# Patient Record
Sex: Male | Born: 1960 | Race: White | Hispanic: No | Marital: Single | Smoking: Current every day smoker
Health system: Southern US, Community
[De-identification: ages and names within clinical notes are randomized; demographics above are authoritative.]

## PROBLEM LIST (undated history)

## (undated) DIAGNOSIS — F1010 Alcohol abuse, uncomplicated: Secondary | ICD-10-CM

## (undated) DIAGNOSIS — F329 Major depressive disorder, single episode, unspecified: Secondary | ICD-10-CM

## (undated) DIAGNOSIS — F1120 Opioid dependence, uncomplicated: Secondary | ICD-10-CM

## (undated) DIAGNOSIS — Z8619 Personal history of other infectious and parasitic diseases: Secondary | ICD-10-CM

## (undated) DIAGNOSIS — K219 Gastro-esophageal reflux disease without esophagitis: Secondary | ICD-10-CM

## (undated) DIAGNOSIS — F319 Bipolar disorder, unspecified: Secondary | ICD-10-CM

## (undated) DIAGNOSIS — J189 Pneumonia, unspecified organism: Secondary | ICD-10-CM

## (undated) DIAGNOSIS — F32A Depression, unspecified: Secondary | ICD-10-CM

## (undated) DIAGNOSIS — J449 Chronic obstructive pulmonary disease, unspecified: Secondary | ICD-10-CM

## (undated) DIAGNOSIS — K429 Umbilical hernia without obstruction or gangrene: Secondary | ICD-10-CM

## (undated) DIAGNOSIS — F419 Anxiety disorder, unspecified: Secondary | ICD-10-CM

## (undated) DIAGNOSIS — M5136 Other intervertebral disc degeneration, lumbar region: Secondary | ICD-10-CM

## (undated) HISTORY — PX: COLONOSCOPY: SHX174

## (undated) HISTORY — DX: Depression, unspecified: F32.A

## (undated) HISTORY — DX: Other intervertebral disc degeneration, lumbar region: M51.36

## (undated) HISTORY — DX: Alcohol abuse, uncomplicated: F10.10

## (undated) HISTORY — DX: Gastro-esophageal reflux disease without esophagitis: K21.9

## (undated) HISTORY — DX: Major depressive disorder, single episode, unspecified: F32.9

## (undated) HISTORY — DX: Opioid dependence, uncomplicated: F11.20

## (undated) HISTORY — PX: TONSILLECTOMY: SHX5217

## (undated) HISTORY — PX: HERNIA REPAIR: SHX51

## (undated) HISTORY — DX: Personal history of other infectious and parasitic diseases: Z86.19

---

## 2014-07-07 DIAGNOSIS — Z8619 Personal history of other infectious and parasitic diseases: Secondary | ICD-10-CM

## 2014-07-07 HISTORY — DX: Personal history of other infectious and parasitic diseases: Z86.19

## 2014-07-17 DIAGNOSIS — M5136 Other intervertebral disc degeneration, lumbar region: Secondary | ICD-10-CM | POA: Insufficient documentation

## 2014-07-17 DIAGNOSIS — Z720 Tobacco use: Secondary | ICD-10-CM | POA: Insufficient documentation

## 2014-07-17 DIAGNOSIS — B1920 Unspecified viral hepatitis C without hepatic coma: Secondary | ICD-10-CM | POA: Insufficient documentation

## 2014-07-17 DIAGNOSIS — F17200 Nicotine dependence, unspecified, uncomplicated: Secondary | ICD-10-CM | POA: Insufficient documentation

## 2015-09-06 DIAGNOSIS — M792 Neuralgia and neuritis, unspecified: Secondary | ICD-10-CM | POA: Insufficient documentation

## 2015-10-11 DIAGNOSIS — Z0289 Encounter for other administrative examinations: Secondary | ICD-10-CM | POA: Insufficient documentation

## 2016-02-05 DIAGNOSIS — Z91148 Patient's other noncompliance with medication regimen for other reason: Secondary | ICD-10-CM | POA: Insufficient documentation

## 2017-03-11 DIAGNOSIS — K429 Umbilical hernia without obstruction or gangrene: Secondary | ICD-10-CM | POA: Insufficient documentation

## 2017-03-11 DIAGNOSIS — J40 Bronchitis, not specified as acute or chronic: Secondary | ICD-10-CM | POA: Insufficient documentation

## 2017-03-11 DIAGNOSIS — K219 Gastro-esophageal reflux disease without esophagitis: Secondary | ICD-10-CM | POA: Insufficient documentation

## 2017-07-13 DIAGNOSIS — F1020 Alcohol dependence, uncomplicated: Secondary | ICD-10-CM | POA: Insufficient documentation

## 2017-07-13 DIAGNOSIS — F1121 Opioid dependence, in remission: Secondary | ICD-10-CM | POA: Insufficient documentation

## 2017-07-13 DIAGNOSIS — F1090 Alcohol use, unspecified, uncomplicated: Secondary | ICD-10-CM | POA: Insufficient documentation

## 2017-09-09 ENCOUNTER — Encounter: Payer: Self-pay | Admitting: Family Medicine

## 2017-09-09 ENCOUNTER — Ambulatory Visit: Payer: Self-pay | Admitting: Family Medicine

## 2017-09-09 ENCOUNTER — Ambulatory Visit: Payer: Medicaid Other | Admitting: Family Medicine

## 2017-09-09 ENCOUNTER — Other Ambulatory Visit: Payer: Self-pay

## 2017-09-09 VITALS — BP 100/60 | HR 74 | Temp 97.6°F | Ht 71.0 in | Wt 155.0 lb

## 2017-09-09 DIAGNOSIS — F330 Major depressive disorder, recurrent, mild: Secondary | ICD-10-CM | POA: Diagnosis not present

## 2017-09-09 DIAGNOSIS — H6123 Impacted cerumen, bilateral: Secondary | ICD-10-CM

## 2017-09-09 DIAGNOSIS — G894 Chronic pain syndrome: Secondary | ICD-10-CM

## 2017-09-09 DIAGNOSIS — Z0000 Encounter for general adult medical examination without abnormal findings: Secondary | ICD-10-CM

## 2017-09-09 NOTE — Progress Notes (Signed)
  HPI:  Patient presents today for a new patient appointment to establish general primary care, also to discuss getting connected with specialists in [place] as he recently moved.  57 year old with a complex medical history including drug abuse, hepatitis C, depression, suicidality, acid reflux, and chronic pain due to DDD.  Patient did see pain management in [place] but now needs a new provider that he is living in [place]. States that his pain has only been managed with opiates and he has never had injections. He has an umbilical hernia that is easily reducible on exam. Says that it is now starting to hurt him occasionally with eating and with bowel movements.  ROS: See HPI  Past Medical Hx:  -as above  Past Surgical Hx:  -tonsillectomy  Family Hx: updated in Epic  Social Hx: now lives in a group home, do not allow alcohol, feels safe. Recently moved to [place].  Health Maintenance:  - interested in colonscopy  PHYSICAL EXAM: BP 100/60   Pulse 74   Temp 97.6 F (36.4 C) (Oral)   Ht 5\' 11"  (1.803 m)   Wt 155 lb (70.3 kg)   SpO2 94%   BMI 21.62 kg/m  Gen: alert, oriented x3. No acute distress. Caucasian male sitting comfortably at edge of bed HEENT: bilateral ears with impacted cerumen. Black impacted cerumen in right ear, left ear almost completely occluded with cerumen. Endorses decreased hearing. No conjunctival injection bilaterally, no s/s of trauma in bilateral ears or nose. lungs: lungs clear to auscultation bilaterally, symmetric chest rise, no distress Heart: rrr, no m/r/g, palpable peripheral pulses Abdomen: soft, non-tender, non-distended. Umbilical hernia noted, easily reducible but does pop back out almost immediately. Neuro: aox3, cn 2-12 intact, no focal neuro deficits, 5/5 strength BUE, BLE all muscle groups.  ASSESSMENT/PLAN:  # Health maintenance:  -made referral for colonoscopy, has had HIV screening at Outside facilities. -needs  tdap  Impacted cerumen of both ears Patient with decreased hearing and almost complete occlusion of left ear canal. Right with less occlusion but still bad. Patient will come back in 1-2 weeks for cleanout.  Chronic pain syndrome Patient with DDD resulting in chronic pain in lumbar spine. Will make referral to pain management as he needs a new provider now that he has moved to [place].  Healthcare maintenance Will make referral to GI for colonoscopy.  Moderate episode of recurrent major depressive disorder (HCC) Appears well managed today. Lives in a group home that does not allow alcohol. Seems to be in a stable living situation for the time being. Would likely benefit from behavioral health management in the future.     FOLLOW UP: Follow up in 1-2 weeks for ear wax cleanout  Myrene BuddyJacob Oluwatosin Bracy MD PGY-1 Family Medicine Resident

## 2017-09-09 NOTE — Patient Instructions (Signed)
It was great meeting you today Mr. Carlos Montoya! I am glad that things are going fairly well and am glad that you chose us for your medical care. Regarding your colonoscopy and continued follow up of your hepatic function due to your cured Hepatitis C I will refer you to Gastroenterology. In regards to your worsening umbilical hernia I will send you to General surgery for surgical evaluation. I am sorry to hear that you now need a new pain clinic due to the move. I will make a referral to pain management here in [place] for your chronic back pain. Please come back and see me in 1-2 weeks for an ear cleanout. As I am now your new PCP please let me know about any medical questions or concerns.

## 2017-09-10 ENCOUNTER — Encounter: Payer: Self-pay | Admitting: Family Medicine

## 2017-09-10 ENCOUNTER — Other Ambulatory Visit: Payer: Self-pay

## 2017-09-10 ENCOUNTER — Emergency Department (HOSPITAL_COMMUNITY): Payer: Medicaid Other

## 2017-09-10 ENCOUNTER — Emergency Department (HOSPITAL_COMMUNITY)
Admission: EM | Admit: 2017-09-10 | Discharge: 2017-09-11 | Disposition: A | Discharge status: Home / Self Care | Payer: Medicaid Other | Attending: Emergency Medicine | Admitting: Emergency Medicine

## 2017-09-10 ENCOUNTER — Encounter (HOSPITAL_COMMUNITY): Payer: Self-pay | Admitting: Emergency Medicine

## 2017-09-10 DIAGNOSIS — F10120 Alcohol abuse with intoxication, uncomplicated: Secondary | ICD-10-CM | POA: Diagnosis not present

## 2017-09-10 DIAGNOSIS — F1120 Opioid dependence, uncomplicated: Secondary | ICD-10-CM | POA: Insufficient documentation

## 2017-09-10 DIAGNOSIS — R443 Hallucinations, unspecified: Secondary | ICD-10-CM | POA: Insufficient documentation

## 2017-09-10 DIAGNOSIS — F331 Major depressive disorder, recurrent, moderate: Secondary | ICD-10-CM | POA: Insufficient documentation

## 2017-09-10 DIAGNOSIS — F10920 Alcohol use, unspecified with intoxication, uncomplicated: Secondary | ICD-10-CM

## 2017-09-10 DIAGNOSIS — S39012A Strain of muscle, fascia and tendon of lower back, initial encounter: Secondary | ICD-10-CM

## 2017-09-10 DIAGNOSIS — Z23 Encounter for immunization: Secondary | ICD-10-CM | POA: Insufficient documentation

## 2017-09-10 DIAGNOSIS — W010XXA Fall on same level from slipping, tripping and stumbling without subsequent striking against object, initial encounter: Secondary | ICD-10-CM

## 2017-09-10 DIAGNOSIS — H6123 Impacted cerumen, bilateral: Secondary | ICD-10-CM | POA: Insufficient documentation

## 2017-09-10 DIAGNOSIS — H6122 Impacted cerumen, left ear: Secondary | ICD-10-CM | POA: Insufficient documentation

## 2017-09-10 DIAGNOSIS — G894 Chronic pain syndrome: Secondary | ICD-10-CM | POA: Insufficient documentation

## 2017-09-10 DIAGNOSIS — Z0000 Encounter for general adult medical examination without abnormal findings: Secondary | ICD-10-CM | POA: Insufficient documentation

## 2017-09-10 DIAGNOSIS — R4182 Altered mental status, unspecified: Secondary | ICD-10-CM | POA: Diagnosis present

## 2017-09-10 LAB — CBC WITH DIFFERENTIAL/PLATELET
Basophils Absolute: 0 10*3/uL (ref 0.0–0.1)
Basophils Relative: 0 %
EOS PCT: 1 %
Eosinophils Absolute: 0.1 10*3/uL (ref 0.0–0.7)
HEMATOCRIT: 46.6 % (ref 39.0–52.0)
Hemoglobin: 16.4 g/dL (ref 13.0–17.0)
LYMPHS ABS: 3.5 10*3/uL (ref 0.7–4.0)
LYMPHS PCT: 34 %
MCH: 35.1 pg — ABNORMAL HIGH (ref 26.0–34.0)
MCHC: 35.2 g/dL (ref 30.0–36.0)
MCV: 99.8 fL (ref 78.0–100.0)
MONO ABS: 0.4 10*3/uL (ref 0.1–1.0)
MONOS PCT: 4 %
NEUTROS ABS: 6.3 10*3/uL (ref 1.7–7.7)
Neutrophils Relative %: 61 %
Platelets: 322 10*3/uL (ref 150–400)
RBC: 4.67 MIL/uL (ref 4.22–5.81)
RDW: 12.6 % (ref 11.5–15.5)
WBC: 10.3 10*3/uL (ref 4.0–10.5)

## 2017-09-10 LAB — COMPREHENSIVE METABOLIC PANEL
ALK PHOS: 70 U/L (ref 38–126)
ALT: 23 U/L (ref 17–63)
ANION GAP: 12 (ref 5–15)
AST: 32 U/L (ref 15–41)
Albumin: 4.5 g/dL (ref 3.5–5.0)
BUN: 8 mg/dL (ref 6–20)
CALCIUM: 9.5 mg/dL (ref 8.9–10.3)
CO2: 23 mmol/L (ref 22–32)
CREATININE: 0.89 mg/dL (ref 0.61–1.24)
Chloride: 106 mmol/L (ref 101–111)
Glucose, Bld: 115 mg/dL — ABNORMAL HIGH (ref 65–99)
Potassium: 4 mmol/L (ref 3.5–5.1)
Sodium: 141 mmol/L (ref 135–145)
Total Bilirubin: 0.7 mg/dL (ref 0.3–1.2)
Total Protein: 7.6 g/dL (ref 6.5–8.1)

## 2017-09-10 LAB — RAPID URINE DRUG SCREEN, HOSP PERFORMED
Amphetamines: NOT DETECTED
BENZODIAZEPINES: NOT DETECTED
Barbiturates: NOT DETECTED
COCAINE: NOT DETECTED
OPIATES: NOT DETECTED
Tetrahydrocannabinol: NOT DETECTED

## 2017-09-10 LAB — ACETAMINOPHEN LEVEL: Acetaminophen (Tylenol), Serum: <10 ug/mL — ABNORMAL LOW (ref 10–30)

## 2017-09-10 LAB — ETHANOL: Alcohol, Ethyl (B): 276 mg/dL — ABNORMAL HIGH (ref <10)

## 2017-09-10 LAB — SALICYLATE LEVEL: Salicylate Lvl: <7 mg/dL (ref 2.8–30.0)

## 2017-09-10 MED ORDER — LORAZEPAM 2 MG/ML IJ SOLN: 0.0000 mg | Freq: Four times a day (QID) | INTRAMUSCULAR | Status: DC

## 2017-09-10 MED ORDER — THIAMINE HCL 100 MG/ML IJ SOLN: 100.0000 mg | Freq: Every day | INTRAMUSCULAR | Status: DC

## 2017-09-10 MED ORDER — BACITRACIN ZINC 500 UNIT/GM EX OINT
TOPICAL_OINTMENT | Freq: Once | CUTANEOUS | Status: AC
  Administered 2017-09-10: 1 via TOPICAL
  Filled 2017-09-10: qty 0.9

## 2017-09-10 MED ORDER — LORAZEPAM 1 MG PO TABS: 0.0000 mg | ORAL_TABLET | Freq: Two times a day (BID) | ORAL | Status: DC

## 2017-09-10 MED ORDER — TETANUS-DIPHTH-ACELL PERTUSSIS 5-2.5-18.5 LF-MCG/0.5 IM SUSP
0.5000 mL | Freq: Once | INTRAMUSCULAR | Status: AC
  Administered 2017-09-10: 0.5 mL via INTRAMUSCULAR
  Filled 2017-09-10: qty 0.5

## 2017-09-10 MED ORDER — LORAZEPAM 2 MG/ML IJ SOLN
2.0000 mg | Freq: Once | INTRAMUSCULAR | Status: AC
  Administered 2017-09-10: 2 mg via INTRAVENOUS
  Filled 2017-09-10: qty 1

## 2017-09-10 MED ORDER — SODIUM CHLORIDE 0.9 % IV BOLUS (SEPSIS)
1000.0000 mL | Freq: Once | INTRAVENOUS | Status: AC
  Administered 2017-09-10: 1000 mL via INTRAVENOUS

## 2017-09-10 MED ORDER — LORAZEPAM 2 MG/ML IJ SOLN: 0.0000 mg | Freq: Two times a day (BID) | INTRAMUSCULAR | Status: DC

## 2017-09-10 MED ORDER — VITAMIN B-1 100 MG PO TABS: 100.0000 mg | ORAL_TABLET | Freq: Every day | ORAL | Status: DC

## 2017-09-10 MED ORDER — LORAZEPAM 1 MG PO TABS: 0.0000 mg | ORAL_TABLET | Freq: Four times a day (QID) | ORAL | Status: DC

## 2017-09-10 NOTE — Assessment & Plan Note (Signed)
Patient with DDD resulting in chronic pain in lumbar spine. Will make referral to pain management as he needs a new provider now that he has moved to [place].

## 2017-09-10 NOTE — ED Notes (Signed)
This RN called as patient was at Estate agentsecretary desk with intentions to leave. Patient redirected back to room.  Being hooked back to monitor.

## 2017-09-10 NOTE — ED Triage Notes (Signed)
[place] [facility] [place] when a bystander called 911 for multiple falls and alcohol intoxication, small lac present above left eye bleeding controled. BP140/100 P110 CBG90 RR14 98%RA

## 2017-09-10 NOTE — ED Notes (Signed)
Patient in no acute distress at this time. AOx4

## 2017-09-10 NOTE — ED Notes (Signed)
ED Provider at bedside. 

## 2017-09-10 NOTE — ED Provider Notes (Signed)
Assumed care of patient at shift change from [facility].  See prior notes for full H&P. Briefly, 57 y.o. M here in ED after bystander saw him fall onto face.   He is clearly intoxicated, reports impending alien invsaion and wanted to come here because its safer.    Plan:  Sober, re-assess.  If still hallucinating, get TTS.  10:28 PM Patient has tried to walk out of ED.  He is still heavily intoxicated, cannot make his own medical decisions at this time.  He has been IVC'd.   Results for orders placed or performed during the hospital encounter of 09/10/17  Ethanol  Result Value Ref Range   Alcohol, Ethyl (B) 276 (H) <10 mg/dL  Comprehensive metabolic panel  Result Value Ref Range   Sodium 141 135 - 145 mmol/L   Potassium 4.0 3.5 - 5.1 mmol/L   Chloride 106 101 - 111 mmol/L   CO2 23 22 - 32 mmol/L   Glucose, Bld 115 (H) 65 - 99 mg/dL   BUN 8 6 - 20 mg/dL   Creatinine, Ser 1.61 0.61 - 1.24 mg/dL   Calcium 9.5 8.9 - 09.6 mg/dL   Total Protein 7.6 6.5 - 8.1 g/dL   Albumin 4.5 3.5 - 5.0 g/dL   AST 32 15 - 41 U/L   ALT 23 17 - 63 U/L   Alkaline Phosphatase 70 38 - 126 U/L   Total Bilirubin 0.7 0.3 - 1.2 mg/dL   GFR calc non Af Amer >60 >60 mL/min   GFR calc Af Amer >60 >60 mL/min   Anion gap 12 5 - 15  Urine rapid drug screen (hosp performed)  Result Value Ref Range   Opiates NONE DETECTED NONE DETECTED   Cocaine NONE DETECTED NONE DETECTED   Benzodiazepines NONE DETECTED NONE DETECTED   Amphetamines NONE DETECTED NONE DETECTED   Tetrahydrocannabinol NONE DETECTED NONE DETECTED   Barbiturates NONE DETECTED NONE DETECTED  CBC with Diff  Result Value Ref Range   WBC 10.3 4.0 - 10.5 K/uL   RBC 4.67 4.22 - 5.81 MIL/uL   Hemoglobin 16.4 13.0 - 17.0 g/dL   HCT 04.5 40.9 - 81.1 %   MCV 99.8 78.0 - 100.0 fL   MCH 35.1 (H) 26.0 - 34.0 pg   MCHC 35.2 30.0 - 36.0 g/dL   RDW 91.4 78.2 - 95.6 %   Platelets 322 150 - 400 K/uL   Neutrophils Relative % 61 %   Neutro Abs 6.3 1.7 - 7.7 K/uL   Lymphocytes Relative 34 %   Lymphs Abs 3.5 0.7 - 4.0 K/uL   Monocytes Relative 4 %   Monocytes Absolute 0.4 0.1 - 1.0 K/uL   Eosinophils Relative 1 %   Eosinophils Absolute 0.1 0.0 - 0.7 K/uL   Basophils Relative 0 %   Basophils Absolute 0.0 0.0 - 0.1 K/uL  Salicylate level  Result Value Ref Range   Salicylate Lvl <7.0 2.8 - 30.0 mg/dL  Acetaminophen level  Result Value Ref Range   Acetaminophen (Tylenol), Serum <10 (L) 10 - 30 ug/mL   Ct Head Wo Contrast  Result Date: 09/10/2017 CLINICAL DATA:  Pt in multiple falls and alcohol intoxication, small laceration present above left eye bleeding control. EXAM: CT HEAD WITHOUT CONTRAST TECHNIQUE: Contiguous axial images were obtained from the base of the skull through the vertex without intravenous contrast. COMPARISON:  None FINDINGS: Brain: No evidence of acute infarction, hemorrhage, hydrocephalus, extra-axial collection or mass lesion/mass effect. Vascular: No hyperdense vessel or unexpected  calcification. Skull: Normal. Negative for fracture or focal lesion. Sinuses/Orbits: Remote fractures of the MEDIAL walls of both orbits. There is opacification of numerous ethmoid air cells. Orbits are intact. Other: None IMPRESSION: 1.  No evidence for acute intracranial abnormality. 2. Remote fractures of the MEDIAL walls of both orbits. 3. Chronic ethmoid sinus changes. Electronically Signed   By: Norva PavlovElizabeth  Brown M.D.   On: 09/10/2017 20:08   Patient's labs overall reassuring.  Ethanol 276.  Will get TTS consult.  Patient placed on CIWA protocol.  4:15 AM TTS has evaluated-- recommends overnight observation and reassess by psychiatry in the AM.   Garlon HatchetSanders, Lisa M, PA-C 09/11/17 0415    Shaune PollackIsaacs, Cameron, MD 09/11/17 [phone]

## 2017-09-10 NOTE — Assessment & Plan Note (Signed)
Will make referral to GI for colonoscopy.

## 2017-09-10 NOTE — Progress Notes (Signed)
TTS spoke with pt's nurse Madison, RN who states the pt was given Ativan and is currently unable to be assessed. RN requested TTS call back between 01:00-01:30 and attempt to assess pt.  Princess BruinsAquicha Lew Prout, MSW, LCSW Therapeutic Triage Specialist  [phone]

## 2017-09-10 NOTE — ED Provider Notes (Signed)
[facility] EMERGENCY DEPARTMENT Provider Note   CSN: [id number] Arrival date & time: 09/10/17  1902     History   Chief Complaint Chief Complaint  Patient presents with  . Fall  . Alcohol Intoxication    HPI  Carlos Montoya is a 57 y.o. Male with history of acid reflux, degenerative disc disease, hep C, alcohol abuse and opioid dependence, who presents to the ED via EMS from downtown after a bystander called 911 for multiple falls and alcohol intoxication, small laceration to the left eyebrow noted.  When when asked why he is here patient reports he asked the police to bring him here because there is an alien invasion that is going to happen soon.  She does endorse drinking a 6 pack of beer tonight, reports he does not drink regularly but drink tonight because his back was hurting, patient reports he has had chronic back pain for several years.  When asked about falling patient denies this, is unsure how he sustained laceration to his head, but does report mild headache.  It repeatedly circles back to impending alien invasion, also repeatedly states he needs to call some people.  Level V Caveat: acute alcohol intoxication      Past Medical History:  Diagnosis Date  . Acid reflux   . Alcohol use disorder, mild, abuse   . Degenerative disc disease, lumbar   . Depression   . History of hepatitis C   . Opioid dependence [facility](HCC)     Patient Active Problem List   Diagnosis Date Noted  . Healthcare maintenance 09/10/2017  . Chronic pain syndrome 09/10/2017  . Impacted cerumen of both ears 09/10/2017  . Moderate episode of recurrent major depressive disorder (HCC) 09/10/2017  . Alcohol use disorder, severe, dependence (HCC) 07/13/2017  . GERD (gastroesophageal reflux disease) 03/11/2017  . Umbilical hernia 03/11/2017  . Degenerative disc disease, lumbar 07/17/2014  . Tobacco abuse 07/17/2014    Past Surgical History:  Procedure Laterality Date  .  TONSILLECTOMY         Home Medications    Prior to Admission medications   Medication Sig Start Date End Date Taking? Authorizing Provider  FLUoxetine (PROZAC) 10 MG tablet Take 10 mg by mouth daily.    [provider]  gabapentin (NEURONTIN) 100 MG capsule Take 100 mg by mouth 3 (three) times daily.    [provider]    Family History Family History  Problem Relation Age of Onset  . Diabetes Mother   . Heart disease Father   . Colon cancer Father     Social History Social History   Tobacco Use  . Smoking status: Current Every Day Smoker  . Smokeless tobacco: Never Used  Substance Use Topics  . Alcohol use: Yes  . Drug use: No     Allergies   Patient has no known allergies.   Review of Systems Review of Systems  Unable to perform ROS: Other (Acute Intoxication)     Physical Exam Updated Vital Signs BP 116/79   Pulse 77   Temp 98.8 F (37.1 C) (Oral)   Resp 16   Ht 5\' 11"  (1.803 m)   Wt 68 kg (150 lb)   SpO2 93%   BMI 20.92 kg/m   Physical Exam  Constitutional: He appears well-developed and well-nourished. No distress.  HENT:  Head: Normocephalic.  3 cm superficial laceration to left eyebrow, no active bleeding, not deep enough to require repair, no other lacerations or abrasions  noted, no scalp tenderness, no palpable deformity or step-off  Eyes: Right eye exhibits no discharge. Left eye exhibits no discharge.  Neck:  No midline C-spine tenderness  Cardiovascular: Normal rate, regular rhythm and normal heart sounds.  Pulmonary/Chest: Effort normal and breath sounds normal. No stridor. No respiratory distress. He has no wheezes. He has no rales.  Abdominal: Soft. Bowel sounds are normal. He exhibits no distension and no mass. There is no tenderness. There is no guarding.  Musculoskeletal: He exhibits no edema or deformity.  Neurological: He is alert. Coordination normal.  Patient is alert and oriented to person and place, he is  able to tell the day of the week and the month, but was unsure what year it was or who the president was Normal strength in upper and lower extremities bilaterally including dorsiflexion and plantar flexion, strong and equal grip strength Sensation normal to light and sharp touch Moves extremities without ataxia, coordination intact  Skin: Skin is warm and dry. Capillary refill takes less than 2 seconds. He is not diaphoretic.  Nursing note and vitals reviewed.    ED Treatments / Results  Labs (all labs ordered are listed, but only abnormal results are displayed) Labs Reviewed  ETHANOL - Abnormal; Notable for the following components:      Result Value   Alcohol, Ethyl (B) 276 (*)    All other components within normal limits  COMPREHENSIVE METABOLIC PANEL - Abnormal; Notable for the following components:   Glucose, Bld 115 (*)    All other components within normal limits  CBC WITH DIFFERENTIAL/PLATELET - Abnormal; Notable for the following components:   MCH 35.1 (*)    All other components within normal limits  ACETAMINOPHEN LEVEL - Abnormal; Notable for the following components:   Acetaminophen (Tylenol), Serum <10 (*)    All other components within normal limits  RAPID URINE DRUG SCREEN, HOSP PERFORMED  SALICYLATE LEVEL    EKG  EKG Interpretation None       Radiology Ct Head Wo Contrast  Result Date: 09/10/2017 CLINICAL DATA:  Pt in multiple falls and alcohol intoxication, small laceration present above left eye bleeding control. EXAM: CT HEAD WITHOUT CONTRAST TECHNIQUE: Contiguous axial images were obtained from the base of the skull through the vertex without intravenous contrast. COMPARISON:  None FINDINGS: Brain: No evidence of acute infarction, hemorrhage, hydrocephalus, extra-axial collection or mass lesion/mass effect. Vascular: No hyperdense vessel or unexpected calcification. Skull: Normal. Negative for fracture or focal lesion. Sinuses/Orbits: Remote fractures of  the MEDIAL walls of both orbits. There is opacification of numerous ethmoid air cells. Orbits are intact. Other: None IMPRESSION: 1.  No evidence for acute intracranial abnormality. 2. Remote fractures of the MEDIAL walls of both orbits. 3. Chronic ethmoid sinus changes. Electronically Signed   By: Norva Pavlov M.D.   On: 09/10/2017 20:08    Procedures Procedures (including critical care time)  Medications Ordered in ED Medications  sodium chloride 0.9 % bolus 1,000 mL (0 mLs Intravenous Stopped 09/10/17 2222)  Tdap (BOOSTRIX) injection 0.5 mL (0.5 mLs Intramuscular Given 09/10/17 2137)  bacitracin ointment (1 application Topical Given 09/10/17 2137)  LORazepam (ATIVAN) injection 2 mg (2 mg Intravenous Given 09/10/17 2227)     Initial Impression / Assessment and Plan / ED Course  I have reviewed the triage vital signs and the nursing notes.  Pertinent labs & imaging results that were available during my care of the patient were reviewed by me and considered in my medical decision making (  see chart for details).  Patient presents to the emergency department for evaluation of multiple falls and alcohol intoxication brought in by EMS after bystanders called 911.  Small superficial laceration to the left eyebrow, not deep enough to require repair, tetanus updated, bacitracin and dressing applied.  No other head trauma evidence, CT head ordered in triage shows no evidence of acute intracranial abnormality, there are remote fractures of the medial walls of both orbits.   Pt ruminating on impending alien invaisionIt is unclear if this is psychiatric or due to acute alcohol intoxication, will plan to get medical clearance labs, give a liter of fluid and reassess patient after he is clinically sober.  10:10 PM notified by secretary the patient is trying to elope, at this time patient is still acutely intoxicated, continues to show signs of hallucinations, ruminating on alien invasion, feel that patient is  not capable of making good decisions and is a danger to himself will place patient under IVC.  Patient brought back to his room by [employer] and security.  At shift change care signed out to PA Sharilyn Sites who will reevaluate patient after he has had time to clinically sober, and follow-up on medical clearance labs.  If patient continues to exhibit signs of hallucinations will place TTS consult.  Patient discussed with Dr. Erma Heritage, who saw patient as well and agrees with plan.   Final Clinical Impressions(s) / ED Diagnoses   Final diagnoses:  Alcoholic intoxication without complication [facility])  Hallucinations    ED Discharge Orders    None       Legrand Rams 09/10/17 2245    Shaune Pollack, MD 09/11/17 Robynn Pane    Shaune Pollack, MD 09/11/17 [phone]

## 2017-09-10 NOTE — Assessment & Plan Note (Signed)
Patient with decreased hearing and almost complete occlusion of left ear canal. Right with less occlusion but still bad. Patient will come back in 1-2 weeks for cleanout.

## 2017-09-10 NOTE — ED Notes (Signed)
Pt tried to elope. EDP taking out IVC papers on pt at this time. Security and [employer] brought pt back to room. Pt back in room at this time.

## 2017-09-10 NOTE — Assessment & Plan Note (Signed)
Appears well managed today. Lives in a group home that does not allow alcohol. Seems to be in a stable living situation for the time being. Would likely benefit from behavioral health management in the future.

## 2017-09-10 NOTE — ED Notes (Signed)
After using a urinal in Pod A hallway pt stated that he was leaving and walked out

## 2017-09-11 NOTE — Progress Notes (Signed)
TTS requested a [facility] of the [facility]. Madison, RN made aware.   Princess BruinsAquicha Javian Nudd, MSW, LCSW Therapeutic Triage Specialist  [phone]

## 2017-09-11 NOTE — ED Notes (Signed)
Breakfast tray given. °

## 2017-09-11 NOTE — ED Notes (Signed)
Pt got out of bed and tried to leave. I educated pt on IVC. Pt is now corporative and calm.

## 2017-09-11 NOTE — ED Notes (Signed)
Pts belongings stored in locker #1

## 2017-09-11 NOTE — ED Provider Notes (Signed)
Patient is awake and alert, and is requesting d/c.  Pt is no longer intoxicated and appears clinically sober.  He is alert and oriented. Has eaten/drank, and ambulates w steady gait.  He has normal mood and affect. He denies any thoughts of harm to self or others. No delusions or hallucinations, no acute psychosis.   Patient current appears stable for d/c.   He indicates he will follow up with [facility] as outpt.   Requests a work note for today.     Cathren LaineSteinl, Jannessa Ogden, MD 09/11/17 1043

## 2017-09-11 NOTE — ED Notes (Signed)
TTS called to speak to pt. Pt is still intoxicated and sleeping at this time. Told TTS to call back in 1 to 2 hours.

## 2017-09-11 NOTE — ED Notes (Signed)
All Belongings were given to patient .

## 2017-09-11 NOTE — BH Assessment (Addendum)
Tele Assessment Note   Patient Name: Carlos Montoya MRN: [id number] Referring Physician: Legrand Rams Location of Patient: [facility] Location of Provider: Behavioral Health TTS Department  Carlos Montoya is an 57 y.o. male who presents to the ED initially voluntarily, however he was IVC'd by the EDP after attempting to elope. Per chart, pt presented to the ED after witnesses called EMS due to observing the pt intoxicated and falling over. While in the ED, pt reported to the EDP "there is an alien invasion that is going to happen soon." Pt's BAL was 276 on arrival to ED.  During the assessment the pt is calm and cooperative. Pt denies SI, HI, and AVH. Pt states he is followed by [facility] for OPT needs. Pt denies any hx of inpt admission and denies any prior SI. Pt states he has a degenerative disorder, therefore he is in frequent pain. Pt states he consumes alcohol to help ease the pain. Pt reports to this writer that yesterday was the first time he consumed alcohol in 2 months. Pt states he has an upcoming appointment at [facility] next week. 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   Per Nira Conn, NP pt is recommended for continued observation for safety and stabilization and to be reassessed in the AM by psychiatry. Pt's nurse Denyce Robert, RN and EDP Garlon Hatchet, PA-C have been advised of the disposition.  Diagnosis: Alcohol use disorder, severe   Past Medical History:  Past Medical History:  Diagnosis Date  . Acid reflux   . Alcohol use disorder, mild, abuse   . Degenerative disc disease, lumbar   . Depression   . History of hepatitis C   . Opioid dependence (HCC)     Past Surgical History:  Procedure Laterality Date  . TONSILLECTOMY      Family History:  Family History  Problem Relation Age of Onset  . Diabetes Mother   . Heart disease Father   . Colon cancer Father     Social History:  reports that he has been smoking.  he  has never used smokeless tobacco. He reports that he drinks alcohol. He reports that he does not use drugs.  Additional Social History:  Alcohol / Drug Use Pain Medications: See MAR Prescriptions: See MAR Over the Counter: See MAR History of alcohol / drug use?: Yes Substance #1 Name of Substance 1: Alcohol 1 - Age of First Use: teens 1 - Amount (size/oz): a few beers 1 - Frequency: occasional 1 - Duration: ongoing 1 - Last Use / Amount: 09/10/17  CIWA: CIWA-Ar BP: 102/72 Pulse Rate: 78 Nausea and Vomiting: no nausea and no vomiting Tactile Disturbances: very mild itching, pins and needles, burning or numbness Tremor: not visible, but can be felt fingertip to fingertip Auditory Disturbances: not present Paroxysmal Sweats: no sweat visible Visual Disturbances: not present Anxiety: three Headache, Fullness in Head: moderately severe Agitation: moderately fidgety and restless Orientation and Clouding of Sensorium: oriented and can do serial additions CIWA-Ar Total: 13 COWS:    Allergies: No Known Allergies  Home Medications:  (Not in a hospital admission)  OB/GYN Status:  No LMP for male patient.  General Assessment Data Assessment unable to be completed: Yes Reason for not completing assessment: TTS spoke with pt's nurse Madison, RN who states the pt was given Ativan and is currently unable to be assessed. RN requested TTS call back between 01:00-01:30 and attempt to assess pt. Location of Assessment: [facility]  ED TTS Assessment: In system Is this a Tele or Face-to-Face Assessment?: Tele Assessment Is this an Initial Assessment or a Re-assessment for this encounter?: Initial Assessment Marital status: Single Is patient pregnant?: No Pregnancy Status: No Living Arrangements: Alone Can pt return to current living arrangement?: Yes Admission Status: Involuntary Is patient capable of signing voluntary admission?: No Referral Source: Self/Family/Friend Insurance type:  Medicaid     Crisis Care Plan Living Arrangements: Alone Name of Psychiatrist: Transport planner Name of Therapist: [facility]  Education Status Is patient currently in school?: No Is the patient employed, unemployed or receiving disability?: Unemployed  Risk to self with the past 6 months Suicidal Ideation: No Has patient been a risk to self within the past 6 months prior to admission? : No Suicidal Intent: No Has patient had any suicidal intent within the past 6 months prior to admission? : No Is patient at risk for suicide?: No Suicidal Plan?: No Has patient had any suicidal plan within the past 6 months prior to admission? : No Access to Means: No What has been your use of drugs/alcohol within the last 12 months?: reports to occasional alcohol use  Previous Attempts/Gestures: No Triggers for Past Attempts: None known Intentional Self Injurious Behavior: None Family Suicide History: No Recent stressful life event(s): Recent negative physical changes Persecutory voices/beliefs?: No Depression: No Substance abuse history and/or treatment for substance abuse?: Yes(pt states 15-20 years ago) Suicide prevention information given to non-admitted patients: Not applicable  Risk to Others within the past 6 months Homicidal Ideation: No Does patient have any lifetime risk of violence toward others beyond the six months prior to admission? : No Thoughts of Harm to Others: No Current Homicidal Intent: No Current Homicidal Plan: No Access to Homicidal Means: No History of harm to others?: No Assessment of Violence: None Noted Does patient have access to weapons?: No Criminal Charges Pending?: No Does patient have a court date: No Is patient on probation?: No  Psychosis Hallucinations: None noted Delusions: Unspecified  Mental Status Report Appearance/Hygiene: In hospital gown Eye Contact: Good Motor Activity: Freedom of movement Speech: Slow, Slurred Level of Consciousness:  Drowsy Mood: Euthymic Affect: Appropriate to circumstance Anxiety Level: None Thought Processes: Coherent, Relevant Judgement: Partial Orientation: Person, Place, Time, Appropriate for developmental age Obsessive Compulsive Thoughts/Behaviors: None  Cognitive Functioning Concentration: Normal Memory: Remote Intact, Recent Intact Is patient IDD: No Is patient DD?: No Insight: Poor Impulse Control: Poor Appetite: Good Have you had any weight changes? : No Change Sleep: Decreased Total Hours of Sleep: 4 Vegetative Symptoms: None  ADLScreening [facility] Assessment Services) Patient's cognitive ability adequate to safely complete daily activities?: Yes Patient able to express need for assistance with ADLs?: Yes Independently performs ADLs?: Yes (appropriate for developmental age)  Prior Inpatient Therapy Prior Inpatient Therapy: No  Prior Outpatient Therapy Prior Outpatient Therapy: Yes Prior Therapy Dates: current Prior Therapy Facilty/Provider(s): [facility]  Reason for Treatment: Depression, Alcohol abuse  Does patient have an ACCT team?: No Does patient have Intensive In-House Services?  : No Does patient have [facility] services? : Yes Does patient have P4CC services?: No  ADL Screening (condition at time of admission) Patient's cognitive ability adequate to safely complete daily activities?: Yes Is the patient deaf or have difficulty hearing?: Yes Does the patient have difficulty seeing, even when wearing glasses/contacts?: No Does the patient have difficulty concentrating, remembering, or making decisions?: No Patient able to express need for assistance with ADLs?: Yes Does the patient have difficulty dressing or bathing?: No Independently  performs ADLs?: Yes (appropriate for developmental age) Does the patient have difficulty walking or climbing stairs?: No Weakness of Legs: None Weakness of Arms/Hands: None  Home Assistive Devices/Equipment Home Assistive  Devices/Equipment: None    Abuse/Neglect Assessment (Assessment to be complete while patient is alone) Abuse/Neglect Assessment Can Be Completed: Yes Physical Abuse: Denies Verbal Abuse: Denies Sexual Abuse: Denies Exploitation of patient/patient's resources: Denies Self-Neglect: Denies     Merchant navy officerAdvance Directives (For Healthcare) Does Patient Have a Medical Advance Directive?: No Would patient like information on creating a medical advance directive?: No - Patient declined    Additional Information 1:1 In Past 12 Months?: No CIRT Risk: No Elopement Risk: Yes Does patient have medical clearance?: Yes     Disposition: Per Nira ConnJason Berry, NP pt is recommended for continued observation for safety and stabilization and to be reassessed in the AM by psychiatry. Pt's nurse Denyce RobertFountain, Madison L, RN and EDP Garlon HatchetSanders, Lisa M, PA-C have been advised of the disposition.  Disposition Initial Assessment Completed for this Encounter: Yes Disposition of Patient: (overnight observation per Nira ConnJason Berry, NP) Patient refused recommended treatment: No  This service was provided via telemedicine using a 2-way, interactive audio and video technology.  Names of all persons participating in this telemedicine service and their role in this encounter. Name: Carlos ShellJohn [facility] Role: Patient  Name: Carlos Montoya Role: TTS          Carlos Montoya 09/11/2017 3:51 AM

## 2017-09-11 NOTE — Progress Notes (Signed)
Per Nira ConnJason Berry, NP pt is recommended for continued observation for safety and stabilization and to be reassessed in the AM by psychiatry. Pt's nurse Denyce RobertFountain, Madison L, RN and EDP Garlon HatchetSanders, Lisa M, PA-C have been advised of the disposition.  Princess BruinsAquicha Sahaj Bona, MSW, LCSW Therapeutic Triage Specialist  [phone]

## 2017-09-11 NOTE — Discharge Instructions (Signed)
It was our pleasure to provide your ER care today - we hope that you feel better.  Take ibuprofen or acetaminophen as need.  Avoid any alcohol use - follow up with AA, and use resource guide provided for additional community resources.  For mental health issues and/or crisis, [facility].   Also follow up with primary care doctor in the coming week.  Return to ER if worse, new symptoms, or other emergency.

## 2017-09-11 NOTE — ED Notes (Signed)
Pt valuables sent to security

## 2017-09-22 ENCOUNTER — Other Ambulatory Visit: Payer: Self-pay

## 2017-09-22 ENCOUNTER — Encounter: Payer: Self-pay | Admitting: Family Medicine

## 2017-09-22 ENCOUNTER — Ambulatory Visit: Payer: Medicaid Other | Admitting: Family Medicine

## 2017-09-22 VITALS — BP 102/60 | HR 80 | Temp 97.4°F | Wt 150.0 lb

## 2017-09-22 DIAGNOSIS — H6690 Otitis media, unspecified, unspecified ear: Secondary | ICD-10-CM

## 2017-09-22 DIAGNOSIS — F1020 Alcohol dependence, uncomplicated: Secondary | ICD-10-CM | POA: Diagnosis not present

## 2017-09-22 DIAGNOSIS — H6123 Impacted cerumen, bilateral: Secondary | ICD-10-CM | POA: Diagnosis present

## 2017-09-22 MED ORDER — AMOXICILLIN-POT CLAVULANATE 875-125 MG PO TABS: 1.0000 | ORAL_TABLET | Freq: Two times a day (BID) | ORAL | 0 refills | Status: AC

## 2017-09-22 MED ORDER — CARBAMIDE PEROXIDE 6.5 % OT SOLN: 5.0000 [drp] | Freq: Two times a day (BID) | OTIC | 0 refills | Status: DC

## 2017-09-22 NOTE — Assessment & Plan Note (Signed)
Once earwax disimpacted, a swollen erythematous tympanic membrane was revealed in the left ear. Will give 7 day course of augmentin.

## 2017-09-22 NOTE — Assessment & Plan Note (Addendum)
Patient seems to have poor insight into his alcohol abuse. He states that it has not been a problem since his ed visit. PHQ-9 score is a 7. Seems to want to self-medicate with etoh. Have asked patient to [facility]. Will defer management of this problem to his outpatient psychiatrist.

## 2017-09-22 NOTE — Patient Instructions (Addendum)
It was great seeing you again today! I am glad that you have been doing well since you were in the emergency department. Please keep your appointments at [facility] for this. I am sorry that you are having so much difficulty with your living situation, hopefully this will improve soon. In regards to your ear wax, I am glad we were able to get this cleaned out for you in the left ear. Please come back in a few weeks and we can clean out the other ear as well as resolve any other issues. I believe that the bump around your ear is a swollen lymph node after an ear infection. I will give you a short course of antibiotics for this. You will take a medication called augmentin 2 times daily for 10 days. I am glad that you have your colonoscopy scheduled, please have this done. I will check with our referral coordinator in regards to your pain management referral. Please come back and see me if any issues arise.  In preparation for your right ear cleanout, I am giving you a prescription for debrox. It is an ear cleanout solution and kit that will help soften up that wax for next time.

## 2017-09-22 NOTE — Progress Notes (Signed)
HPI 57 year old who presents for ed follow up and ear wax cleanout. He was recently seen in the ed on 3/7 for alcohol intoxication. HE was found stumbling around and had several witnessed falls. Per the ed provider notes he was afraid of an impending alien invasion and sought refuge in the ed. He also voiced suicidal ideation. Once he sobered up he was discharged as he no longer had SI and his thought process has returned to baseline. He states that he drunk the alcohol as he had been having bad back pain and was "self-medicating". He sustained small lacs on his face and head which have all healed. He says that his current issues in life are his back pain and his living situation. He is in some sort of group housing situation and is waiting on a house of his own which will be provided through the [employer]. PHQ-9 score is a 7 today.   On 3/6 he was seen as a new patient and noted to have bilateral cerumen impaction. He returns today for cleanout. He is complaining of pain in his left ear. He states that he has a bump behind it and it is painful to touch in inner ear.   CC: ed follow up, ear wax cleanout, ear pain   ROS: Review of Systems  Constitutional: Negative for chills and fever.  HENT: Positive for ear pain and tinnitus. Negative for congestion and sinus pain.   Respiratory: Negative for cough, hemoptysis, sputum production and shortness of breath.   Cardiovascular: Negative for chest pain and palpitations.  Gastrointestinal: Negative for abdominal pain, constipation, diarrhea, nausea and vomiting.  Musculoskeletal: Positive for back pain.  Neurological: Negative for weakness.  Psychiatric/Behavioral: Positive for depression. Negative for suicidal ideas.    Review of Systems See HPI for ROS.   CC, SH/smoking status, and VS noted  Objective: BP 102/60   Pulse 80   Temp (!) 97.4 F (36.3 C) (Oral)   Wt 150 lb (68 kg)   SpO2 95%   BMI 20.92 kg/m  Gen: alert, oriented  x3. No acute distress. Caucasian male sitting comfortably at edge of bed HEENT: bilateral ears with impacted cerumen. Black impacted cerumen in right ear, left ear almost completely occluded with cerumen. Endorses decreased hearing. No conjunctival injection bilaterally, no s/s of trauma in bilateral ears or nose. lungs: lungs clear to auscultation bilaterally, symmetric chest rise, no distress Heart: rrr, no m/r/g, palpable peripheral pulses Abdomen: soft, non-tender, non-distended. Umbilical hernia noted, easily reducible but does pop back out almost immediately. Neuro: aox3, cn 2-12 intact, no focal neuro deficits, 5/5 strength BUE, BLE all muscle groups.   Assessment and plan:  No problem-specific Assessment & Plan notes found for this encounter.  Bilateral Impacted Cerumen After initial difficulty in disimpacting the cerumen from left ear was eventually able to get large amount of cerumen disimpacted from ear with major assistance from Dr. Leveda AnnaHensel. Will give debrox drops to help soften wax in right ear. Will ask patient to come back in 2 weeks or so for cleanout of right ear.  Acute otitis media Once earwax disimpacted, a swollen erythematous tympanic membrane was revealed in the left ear. Will give 7 day course of augmentin.  Alcohol use disorder, severe dependence Patient seems to have poor insight into his alcohol abuse. He states that it has not been a problem since his ed visit. PHQ-9 score is a 7. Seems to want to self-medicate with etoh. Have asked patient to keep  his appointment with [facility] outpatient psych on Friday. Will defer management of this problem to his outpatient psychiatrist.  No orders of the defined types were placed in this encounter.    Myrene Buddy MD PGY-1 Family Medicine Resident 09/22/2017 11:14 AM

## 2017-09-22 NOTE — Assessment & Plan Note (Signed)
After initial difficulty in disimpacting the cerumen from left ear was eventually able to get large amount of cerumen disimpacted from ear with major assistance from Dr. Leveda AnnaHensel. Will give debrox drops to help soften wax in right ear. Will ask patient to come back in 2 weeks or so for cleanout of right ear.

## 2017-10-14 ENCOUNTER — Ambulatory Visit: Payer: Self-pay | Admitting: Family Medicine

## 2017-12-11 ENCOUNTER — Ambulatory Visit (INDEPENDENT_AMBULATORY_CARE_PROVIDER_SITE_OTHER): Payer: Medicaid Other | Admitting: Family Medicine

## 2017-12-11 ENCOUNTER — Encounter: Payer: Self-pay | Admitting: Family Medicine

## 2017-12-11 ENCOUNTER — Other Ambulatory Visit: Payer: Self-pay

## 2017-12-11 VITALS — BP 108/76 | HR 78 | Temp 98.0°F | Ht 71.0 in | Wt 158.8 lb

## 2017-12-11 DIAGNOSIS — K219 Gastro-esophageal reflux disease without esophagitis: Secondary | ICD-10-CM

## 2017-12-11 DIAGNOSIS — H6123 Impacted cerumen, bilateral: Secondary | ICD-10-CM | POA: Diagnosis not present

## 2017-12-11 DIAGNOSIS — K429 Umbilical hernia without obstruction or gangrene: Secondary | ICD-10-CM

## 2017-12-11 DIAGNOSIS — J302 Other seasonal allergic rhinitis: Secondary | ICD-10-CM | POA: Diagnosis not present

## 2017-12-11 DIAGNOSIS — G894 Chronic pain syndrome: Secondary | ICD-10-CM | POA: Diagnosis not present

## 2017-12-11 MED ORDER — OMEPRAZOLE 20 MG PO CPDR: 20.0000 mg | DELAYED_RELEASE_CAPSULE | Freq: Every day | ORAL | 1 refills | Status: DC

## 2017-12-11 MED ORDER — LORATADINE 10 MG PO TABS: 10.0000 mg | ORAL_TABLET | Freq: Every day | ORAL | 0 refills | Status: DC

## 2017-12-11 MED ORDER — MELOXICAM 7.5 MG PO TABS: 7.5000 mg | ORAL_TABLET | Freq: Every day | ORAL | 0 refills | Status: DC

## 2017-12-11 NOTE — Patient Instructions (Addendum)
It was great seeing you again today! I am sorry that we have not had more success with getting you into pain management or surgery. We have sent additional referrals to other pain management clinics. We have contacted the surgery team and they are to call me back today to learn the status.  Regarding your congestion, I think this is due to allergies. I have given you a script for claratin. I have also given you meloxicam for back pain. I would take gas-ex or simethicone for your bad gas.  I also cleaned out your ears.

## 2017-12-18 ENCOUNTER — Encounter: Payer: Self-pay | Admitting: Family Medicine

## 2017-12-18 DIAGNOSIS — J302 Other seasonal allergic rhinitis: Secondary | ICD-10-CM | POA: Insufficient documentation

## 2017-12-18 NOTE — Assessment & Plan Note (Signed)
Disimpacted bilateral impacted cerumen. Patient tolerated well. Got large amount of cerumen out. Patient to buy debrox drops and use to prevent re-accumulation.

## 2017-12-18 NOTE — Assessment & Plan Note (Signed)
Gave prescription for claritin 10mg  daily. Patient to follow up if his allergies do not improve.

## 2017-12-18 NOTE — Assessment & Plan Note (Signed)
Next round of referrals sent after [place] pain management denied.

## 2017-12-18 NOTE — Assessment & Plan Note (Signed)
Refilled omeprazole 20 mg daily  

## 2017-12-18 NOTE — Assessment & Plan Note (Signed)
Resent surgical referral with appropriate association.

## 2017-12-18 NOTE — Progress Notes (Signed)
   HPI 57 year old who presents with multiweek history of sneezing, sinus pressure and congestion. States that he feels like it is consistent with seasonal allergies as he has had these before.  Patient also presents for bilateral hearing difficulties. States that it slowly become more difficult to hear out of left ear since his earwax disimpaction a few months ago. Never tried the debrox drops.  He is also wanting to hear about his referral to chronic pain and surgery. He was denied by the [place] pain management, additional referral placed. The referral to surgery was replaced with appropriate diagnosis association, which was this provider's error.  CC: earwax impaction, sinus problems  ROS:   Review of Systems See HPI for ROS.   CC, SH/smoking status, and VS noted  Objective: BP 108/76 (BP Location: Right Arm, Patient Position: Sitting, Cuff Size: Normal)   Pulse 78   Temp 98 F (36.7 C) (Oral)   Ht 5\' 11"  (1.803 m)   Wt 158 lb 12.8 oz (72 kg)   SpO2 96%   BMI 22.15 kg/m  ZOX:WRUEAGen:alert, oriented x3. No acute distress. Caucasian male sitting comfortably at edge of bed HEENT:bilateral ears with impacted cerumen. Black impacted cerumen in right ear, left ear almost completely occluded with cerumen. Endorses decreased hearing. No conjunctival injection bilaterally, no s/s of trauma in bilateral ears or nose. lungs:lungs clear to auscultation bilaterally, symmetric chest rise, no distress Heart:rrr, no m/r/g, palpable peripheral pulses Abdomen:soft, non-tender, non-distended. Umbilical hernia noted, easily reducible but does pop back out almost immediately. Neuro:aox3, cn 2-12 intact, no focal neuro deficits, 5/5 strength BUE, BLE all muscle groups.  Assessment and plan:  Seasonal allergies Gave prescription for claritin 10mg  daily. Patient to follow up if his allergies do not improve.  Bilateral impacted cerumen Disimpacted bilateral impacted cerumen. Patient tolerated  well. Got large amount of cerumen out. Patient to buy debrox drops and use to prevent re-accumulation.  GERD (gastroesophageal reflux disease) Refilled omeprazole 20mg  daily  Umbilical hernia Resent surgical referral with appropriate association.  Chronic pain syndrome Next round of referrals sent after [place] pain management denied.   No orders of the defined types were placed in this encounter.   Meds ordered this encounter  Medications  . loratadine (CLARITIN) 10 MG tablet    Sig: Take 1 tablet (10 mg total) by mouth daily.    Dispense:  30 tablet    Refill:  0  . meloxicam (MOBIC) 7.5 MG tablet    Sig: Take 1 tablet (7.5 mg total) by mouth daily.    Dispense:  30 tablet    Refill:  0  . omeprazole (PRILOSEC) 20 MG capsule    Sig: Take 1 capsule (20 mg total) by mouth daily.    Dispense:  30 capsule    Refill:  1     Myrene BuddyJacob Jim Lundin MD PGY-1 Family Medicine Resident  12/18/2017 6:59 AM

## 2018-02-17 ENCOUNTER — Encounter (HOSPITAL_COMMUNITY): Payer: Self-pay

## 2018-02-17 ENCOUNTER — Emergency Department (HOSPITAL_COMMUNITY): Payer: Medicaid Other

## 2018-02-17 ENCOUNTER — Emergency Department (HOSPITAL_COMMUNITY)
Admission: EM | Admit: 2018-02-17 | Discharge: 2018-02-17 | Disposition: A | Discharge status: Home / Self Care | Payer: Medicaid Other | Attending: Emergency Medicine | Admitting: Emergency Medicine

## 2018-02-17 DIAGNOSIS — F17200 Nicotine dependence, unspecified, uncomplicated: Secondary | ICD-10-CM | POA: Insufficient documentation

## 2018-02-17 DIAGNOSIS — R0789 Other chest pain: Secondary | ICD-10-CM | POA: Insufficient documentation

## 2018-02-17 DIAGNOSIS — R079 Chest pain, unspecified: Secondary | ICD-10-CM | POA: Diagnosis not present

## 2018-02-17 DIAGNOSIS — R52 Pain, unspecified: Secondary | ICD-10-CM

## 2018-02-17 DIAGNOSIS — Z79899 Other long term (current) drug therapy: Secondary | ICD-10-CM | POA: Insufficient documentation

## 2018-02-17 DIAGNOSIS — M25512 Pain in left shoulder: Secondary | ICD-10-CM | POA: Diagnosis present

## 2018-02-17 DIAGNOSIS — M25511 Pain in right shoulder: Secondary | ICD-10-CM | POA: Insufficient documentation

## 2018-02-17 LAB — COMPREHENSIVE METABOLIC PANEL
ALK PHOS: 66 U/L (ref 38–126)
ALT: 24 U/L (ref 0–44)
AST: 40 U/L (ref 15–41)
Albumin: 4.2 g/dL (ref 3.5–5.0)
Anion gap: 12 (ref 5–15)
BILIRUBIN TOTAL: 0.6 mg/dL (ref 0.3–1.2)
BUN: 6 mg/dL (ref 6–20)
CALCIUM: 9.2 mg/dL (ref 8.9–10.3)
CO2: 25 mmol/L (ref 22–32)
Chloride: 107 mmol/L (ref 98–111)
Creatinine, Ser: 1.09 mg/dL (ref 0.61–1.24)
Glucose, Bld: 85 mg/dL (ref 70–99)
Potassium: 4.1 mmol/L (ref 3.5–5.1)
Sodium: 144 mmol/L (ref 135–145)
TOTAL PROTEIN: 7 g/dL (ref 6.5–8.1)

## 2018-02-17 LAB — CBC WITH DIFFERENTIAL/PLATELET
Abs Immature Granulocytes: 0 10*3/uL (ref 0.0–0.1)
BASOS PCT: 1 %
Basophils Absolute: 0.1 10*3/uL (ref 0.0–0.1)
EOS ABS: 0.3 10*3/uL (ref 0.0–0.7)
Eosinophils Relative: 3 %
HEMATOCRIT: 48.2 % (ref 39.0–52.0)
Hemoglobin: 16.6 g/dL (ref 13.0–17.0)
Immature Granulocytes: 0 %
Lymphocytes Relative: 46 %
Lymphs Abs: 3.4 10*3/uL (ref 0.7–4.0)
MCH: 33.3 pg (ref 26.0–34.0)
MCHC: 34.4 g/dL (ref 30.0–36.0)
MCV: 96.8 fL (ref 78.0–100.0)
MONO ABS: 0.4 10*3/uL (ref 0.1–1.0)
MONOS PCT: 5 %
Neutro Abs: 3.4 10*3/uL (ref 1.7–7.7)
Neutrophils Relative %: 45 %
Platelets: 293 10*3/uL (ref 150–400)
RBC: 4.98 MIL/uL (ref 4.22–5.81)
RDW: 13.1 % (ref 11.5–15.5)
WBC: 7.6 10*3/uL (ref 4.0–10.5)

## 2018-02-17 LAB — TROPONIN I

## 2018-02-17 LAB — PROTIME-INR
INR: 0.98
Prothrombin Time: 12.9 seconds (ref 11.4–15.2)

## 2018-02-17 LAB — MAGNESIUM: MAGNESIUM: 2.1 mg/dL (ref 1.7–2.4)

## 2018-02-17 LAB — BRAIN NATRIURETIC PEPTIDE: B Natriuretic Peptide: 14.1 pg/mL (ref 0.0–100.0)

## 2018-02-17 MED ORDER — MORPHINE SULFATE (PF) 10 MG/ML IV SOLN
10.0000 mg | Freq: Once | INTRAVENOUS | Status: AC
  Administered 2018-02-17: 10 mg via INTRAVENOUS
  Filled 2018-02-17: qty 1

## 2018-02-17 MED ORDER — KETOROLAC TROMETHAMINE 30 MG/ML IJ SOLN
15.0000 mg | Freq: Once | INTRAMUSCULAR | Status: AC
  Administered 2018-02-17: 15 mg via INTRAMUSCULAR
  Filled 2018-02-17: qty 1

## 2018-02-17 MED ORDER — ASPIRIN 81 MG PO CHEW: 324.0000 mg | CHEWABLE_TABLET | Freq: Once | ORAL | Status: DC

## 2018-02-17 MED ORDER — DICLOFENAC EPOLAMINE 1.3 % TD PTCH
1.0000 | MEDICATED_PATCH | Freq: Once | TRANSDERMAL | Status: DC
  Administered 2018-02-17: 1 via TRANSDERMAL
  Filled 2018-02-17: qty 1

## 2018-02-17 NOTE — ED Triage Notes (Signed)
Pt. From home with reports of central chest pain that radiates to left shoulder. Pt. Reports severe left shoulder pain. Per. EMS, pt is ETOH +. A/o x4.  Pt given 324 of aspirin and 2  Nitro with no relief.  CBG 91

## 2018-02-17 NOTE — ED Provider Notes (Signed)
[facility] EMERGENCY [facility]PART[facility] Provider Note   CSN: [id number] Arrival date & time: 02/17/18  2126     History   Chief Complaint No chief complaint on file.   HPI Carlos Montoya is a 57 y.o. male.  HPI Presents with multiple complaints. Initially the patient states that he has had left shoulder pain for about 3 months, now worse over the past day or so. Subsequently the patient states the pain is now in his left upper chest, and right shoulder. Pain seems to be sore, severe, worse with any attempted motion. Patient states that he has seen pain management clinic in the past, but after disagreement was removed from their services. Seems as though he is currently taking OTC Tylenol for pain control, though he requests morphine for additional relief. He denies dyspnea, fever, but reiterates that the pain is now on his left upper chest. Patient acknowledges history of multiple medical issues, states that he takes all the medication as directed.    Past Medical History:  Diagnosis Date  . Acid reflux   . Alcohol use disorder, mild, abuse   . Degenerative disc disease, lumbar   . Depression   . History of hepatitis C   . Opioid dependence [facility](HCC)     Patient Active Problem List   Diagnosis Date Noted  . Seasonal allergies 12/18/2017  . Acute otitis media 09/22/2017  . Healthcare maintenance 09/10/2017  . Chronic pain syndrome 09/10/2017  . Bilateral impacted cerumen 09/10/2017  . Moderate episode of recurrent major depressive disorder (HCC) 09/10/2017  . Alcohol use disorder, severe, dependence (HCC) 07/13/2017  . GERD (gastroesophageal reflux disease) 03/11/2017  . Umbilical hernia 03/11/2017  . Degenerative disc disease, lumbar 07/17/2014  . Tobacco abuse 07/17/2014    Past Surgical History:  Procedure Laterality Date  . TONSILLECTOMY          Home Medications    Prior to Admission medications   Medication Sig Start Date End Date  Taking? Authorizing Provider  gabapentin (NEURONTIN) 600 MG tablet Take 600 mg by mouth 3 (three) times daily. 07/30/17  Yes [provider]  meloxicam (MOBIC) 7.5 MG tablet Take 1 tablet (7.5 mg total) by mouth daily. Patient not taking: Reported on 02/17/2018 12/11/17   Myrene BuddyFletcher, Jacob, MD    Family History Family History  Problem Relation Age of Onset  . Diabetes Mother   . Heart disease Father   . Colon cancer Father     Social History Social History   Tobacco Use  . Smoking status: Current Every Day Smoker  . Smokeless tobacco: Never Used  Substance Use Topics  . Alcohol use: Yes  . Drug use: No     Allergies   Prednisone and Diphenhydramine hcl   Review of Systems Review of Systems  Constitutional:       Per HPI, otherwise negative  HENT:       Per HPI, otherwise negative  Respiratory:       Per HPI, otherwise negative  Cardiovascular:       Per HPI, otherwise negative  Gastrointestinal: Negative for vomiting.  Endocrine:       Negative aside from HPI  Genitourinary:       Neg aside from HPI   Musculoskeletal:       Per HPI, otherwise negative  Skin: Negative.   Neurological: Negative for syncope.  Psychiatric/Behavioral: The patient is nervous/anxious.      Physical Exam Updated Vital Signs BP 119/87   Pulse  86   Resp 11   Ht 5\' 11"  (1.803 m)   Wt 52.2 kg   SpO2 94%   BMI 16.04 kg/m   Physical Exam  Constitutional: He is oriented to person, place, and time. He appears well-developed. No distress.  HENT:  Head: Normocephalic and atraumatic.  Eyes: Conjunctivae and EOM are normal.  Cardiovascular: Normal rate and regular rhythm.  Pulmonary/Chest: Effort normal. No stridor. No respiratory distress.  Abdominal: He exhibits no distension.  Musculoskeletal: He exhibits no edema.       Arms: Neurological: He is alert and oriented to person, place, and time.  Skin: Skin is warm and dry.  Psychiatric: He has a normal mood and affect.    Nursing note and vitals reviewed.    ED Treatments / Results  Labs (all labs ordered are listed, but only abnormal results are displayed) Labs Reviewed  COMPREHENSIVE METABOLIC PANEL  MAGNESIUM  TROPONIN I  BRAIN NATRIURETIC PEPTIDE  CBC WITH DIFFERENTIAL/PLATELET  PROTIME-INR  ETHANOL  CBG MONITORING, ED    EKG EKG Interpretation  Date/Time:  Wednesday February 17 2018 21:53:46 EDT Ventricular Rate:  89 PR Interval:    QRS Duration: 102 QT Interval:  356 QTC Calculation: 434 R Axis:   149 Text Interpretation:  Sinus rhythm Probable left atrial enlargement Probable right ventricular hypertrophy Artifact No significant change since last tracing Abnormal ekg Confirmed by Gerhard Munch [phone]) on 02/17/2018 10:02:12 PM   Radiology Dg Chest Portable 1 View  Result Date: 02/17/2018 CLINICAL DATA:  Chest and LEFT shoulder pain for a few hours. History of substance abuse, smoker. EXAM: PORTABLE CHEST 1 VIEW COMPARISON:  None. FINDINGS: Cardiac silhouette is mildly enlarged. Mediastinal silhouette is not suspicious. Bronchitic changes with bibasilar strandy densities. No pleural effusion or focal consolidation. No pneumothorax. Nodular density LEFT lung base. No pneumothorax. Soft tissue planes are non suspicious. IMPRESSION: 1. Mild cardiomegaly. 2. Bronchitic changes with bibasilar atelectasis/scarring. 3. LEFT lung base nodular density could reflect nipple shadow, confluence of osseous shadows or parenchymal nodule. Recommend follow-up PA and lateral views of the chest with nipple markers when clinically able. Electronically Signed   By: Awilda Metro M.D.   On: 02/17/2018 22:05    Procedures Procedures (including critical care time)  Medications Ordered in ED Medications  aspirin chewable tablet 324 mg (324 mg Oral Not Given 02/17/18 2145)  diclofenac (FLECTOR) 1.3 % 1 patch (1 patch Transdermal Patch Applied 02/17/18 2211)  ketorolac (TORADOL) 30 MG/ML injection 15 mg (15  mg Intramuscular Given 02/17/18 2150)  Morphine Sulfate (PF) SOLN 10 mg (10 mg Intravenous Given 02/17/18 2252)     Initial Impression / Assessment and Plan / ED Course  I have reviewed the triage vital signs and the nursing notes.  Pertinent labs & imaging results that were available during my care of the patient were reviewed by me and considered in my medical decision making (see chart for details).   After my initial evaluation the patient becomes aggressive with nursing, disrespectful, requiring presence of security. As I reentered the room, the patient notes that he is frustrated at his ongoing pain, lack of improvement after initial Toradol, diclofenac. We discussed the importance of being consideration of other patients and nursing staff, to which the patient is initially agreeable.  11:08 PM Patient has another disagreement with nursing staff, though he easily comes down again.   Update:, Patient in no distress  This patient with seemingly chronic pain presents with possible new left upper chest wall  pain. Patient is awake, alert, ambulatory, speaking clearly, though has multiple medical issues, no evidence for new ACS, suspicion for acute on chronic disease. Patient made aware of the importance of following up with primary care, indication for no narcotic prescriptions given his history of chronic pain, discharged in stable condition.  Final Clinical Impressions(s) / ED Diagnoses  Pain   Gerhard MunchLockwood, Justice Aguirre, MD 02/17/18 2309

## 2018-02-17 NOTE — Discharge Instructions (Addendum)
As discussed, your evaluation today has been largely reassuring.  But, it is important that you monitor your condition carefully, and do not hesitate to return to the ED if you develop new, or concerning changes in your condition. ? ?Otherwise, please follow-up with your physician for appropriate ongoing care. ? ?

## 2018-02-17 NOTE — ED Notes (Addendum)
Since upon arriving, Pt has become increasingly more agitated, yelling and using inappropriate language. Pt has been warned repeatedly to not use abusive language at staff. Pt continues to open his door and yell at staff walking by. Security notified.

## 2018-02-17 NOTE — ED Notes (Signed)
Pt yelling, requesting narcotic pain medication, pt yelling at staff being verbal abusive to staff and making sexual comments. Pt taking off monitor and refused treatment, pt states "I know Im prick and you just going to have to deal with it" Pt demanding to see doctor again, MD aware of behavorial issue

## 2018-02-17 NOTE — ED Notes (Signed)
Please note: Lavender, light green, and blue tops sent down to Main Lab; Dark Green [facility].

## 2018-02-17 NOTE — ED Notes (Signed)
Pt coming down hallway yelling at multiple staff members that he wants his pain medication, security at bedside

## 2018-02-23 ENCOUNTER — Other Ambulatory Visit: Payer: Self-pay | Admitting: Family Medicine

## 2018-03-01 ENCOUNTER — Encounter (HOSPITAL_COMMUNITY): Payer: Self-pay

## 2018-03-01 ENCOUNTER — Emergency Department (HOSPITAL_COMMUNITY)
Admission: EM | Admit: 2018-03-01 | Discharge: 2018-03-01 | Disposition: A | Discharge status: Home / Self Care | Payer: Medicaid Other | Attending: Emergency Medicine | Admitting: Emergency Medicine

## 2018-03-01 DIAGNOSIS — F1120 Opioid dependence, uncomplicated: Secondary | ICD-10-CM | POA: Diagnosis not present

## 2018-03-01 DIAGNOSIS — M25512 Pain in left shoulder: Secondary | ICD-10-CM | POA: Insufficient documentation

## 2018-03-01 DIAGNOSIS — F17200 Nicotine dependence, unspecified, uncomplicated: Secondary | ICD-10-CM | POA: Diagnosis not present

## 2018-03-01 DIAGNOSIS — G8929 Other chronic pain: Secondary | ICD-10-CM | POA: Diagnosis not present

## 2018-03-01 DIAGNOSIS — Z79899 Other long term (current) drug therapy: Secondary | ICD-10-CM | POA: Diagnosis not present

## 2018-03-01 DIAGNOSIS — R079 Chest pain, unspecified: Secondary | ICD-10-CM | POA: Diagnosis present

## 2018-03-01 DIAGNOSIS — Z765 Malingerer [conscious simulation]: Secondary | ICD-10-CM

## 2018-03-01 NOTE — ED Notes (Signed)
Pt keeps yelling out demanding pain medication and stating "I am not a junkie, I need pain medication now". Pt informed MD or PA would have to order medications used for CP. Pt aggravated.

## 2018-03-01 NOTE — ED Notes (Signed)
Pt laying back on stretcher, requesting MD

## 2018-03-01 NOTE — ED Notes (Signed)
Pt states he is leaving, MD notified.

## 2018-03-01 NOTE — ED Notes (Signed)
Pt ripped out his IV, upset about not getting morphine. Pt refusing discharge vitals. Patient verbalizes understanding of discharge instructions. Opportunity for questioning and answers were provided.

## 2018-03-01 NOTE — ED Triage Notes (Signed)
Pt arrived via [facility] from home c/o chest pain x 2 days, left sided chest pain radiating to left shoulder.  9/10, denies N/V/SOB.  324 ASA.  Pt smells of ETOH subjectively, pt reports one drink daily.

## 2018-03-01 NOTE — ED Notes (Signed)
Pt states if he doesn't get morphine then he is going to get it on the street

## 2018-03-01 NOTE — ED Notes (Signed)
Pt asking for this RN to "ship him to another hospital".

## 2018-03-01 NOTE — ED Provider Notes (Signed)
[facility] EMERGENCY DEPARTMENT Provider Note   CSN: [id number] Arrival date & time: 03/01/18  1850     History   Chief Complaint Chief Complaint  Patient presents with  . Chest Pain    HPI Carlos Montoya is a 57 y.o. male.  57 year old male with past medical history including hepatitis C, alcohol abuse, opioid dependence, chronic pain syndrome, GERD who presents with left shoulder pain.  Patient presented by EMS from home complaining of 2 days of constant, severe left shoulder pain that goes into his left arm and up onto the side of his chest.  No associated nausea, vomiting, shortness of breath.  He admits that this is the same pain for which he presented to the ED earlier this month.  He states that he has an appointment at a clinic on 9/12 but he does not recall which clinic or what type of specialist.  I asked what he has tried at home to relieve the pain and he states "the only thing that has ever helped the pain was the morphine the last time I was here." He demands pain medication.  The history is provided by the patient.  Chest Pain      Past Medical History:  Diagnosis Date  . Acid reflux   . Alcohol use disorder, mild, abuse   . Degenerative disc disease, lumbar   . Depression   . History of hepatitis C   . Opioid dependence [facility])     Patient Active Problem List   Diagnosis Date Noted  . Seasonal allergies 12/18/2017  . Acute otitis media 09/22/2017  . Healthcare maintenance 09/10/2017  . Chronic pain syndrome 09/10/2017  . Bilateral impacted cerumen 09/10/2017  . Moderate episode of recurrent major depressive disorder (HCC) 09/10/2017  . Alcohol use disorder, severe, dependence (HCC) 07/13/2017  . GERD (gastroesophageal reflux disease) 03/11/2017  . Umbilical hernia 03/11/2017  . Degenerative disc disease, lumbar 07/17/2014  . Tobacco abuse 07/17/2014    Past Surgical History:  Procedure Laterality Date  . TONSILLECTOMY           Home Medications    Prior to Admission medications   Medication Sig Start Date End Date Taking? Authorizing Provider  gabapentin (NEURONTIN) 600 MG tablet Take 600 mg by mouth 3 (three) times daily. 07/30/17   [provider]  loratadine (CLARITIN) 10 MG tablet TAKE 1 TABLET BY MOUTH EVERY DAY 02/23/18   Myrene Buddy, MD  meloxicam (MOBIC) 7.5 MG tablet TAKE 1 TABLET BY MOUTH EVERY DAY 02/23/18   Myrene Buddy, MD    Family History Family History  Problem Relation Age of Onset  . Diabetes Mother   . Heart disease Father   . Colon cancer Father     Social History Social History   Tobacco Use  . Smoking status: Current Every Day Smoker  . Smokeless tobacco: Never Used  Substance Use Topics  . Alcohol use: Yes  . Drug use: No     Allergies   Prednisone and Diphenhydramine hcl   Review of Systems Review of Systems  Unable to perform ROS: Mental status change  Cardiovascular: Positive for chest pain.  Musculoskeletal: Positive for arthralgias.     Physical Exam Updated Vital Signs BP 110/78   Pulse 85   Temp 98.1 F (36.7 C) (Oral)   Resp 16   Ht 5\' 11"  (1.803 m)   Wt 52.1 kg   SpO2 97%   BMI 16.02 kg/m   Physical Exam  Constitutional: He is oriented to person, place, and time. He appears well-developed and well-nourished. No distress.  Disheveled appearance, pacing, agitated  HENT:  Head: Normocephalic and atraumatic.  Eyes: Conjunctivae are normal.  Neck: Neck supple.  Musculoskeletal:  Using both arms to reach for objects  Neurological: He is alert and oriented to person, place, and time.  Skin: Skin is warm and dry.  Psychiatric: Judgment normal. He is agitated.  Aggressive, threatening speech  Nursing note and vitals reviewed.    ED Treatments / Results  Labs (all labs ordered are listed, but only abnormal results are displayed) Labs Reviewed - No data to display  EKG EKG Interpretation  Date/Time:  Monday March 01 2018 19:10:50 EDT Ventricular Rate:  83 PR Interval:  140 QRS Duration: 104 QT Interval:  380 QTC Calculation: 446 R Axis:   103 Text Interpretation:  Normal sinus rhythm Incomplete right bundle branch block Possible Right ventricular hypertrophy Abnormal ECG No significant change since last tracing Confirmed by Frederick PeersLittle, Rachel [phone]) on 03/01/2018 7:58:52 PM   Radiology No results found.  Procedures Procedures (including critical care time)  Medications Ordered in ED Medications - No data to display   Initial Impression / Assessment and Plan / ED Course  I have reviewed the triage vital signs and the nursing notes.      He was disheveled, angry, and aggressive on exam.  He yelled multiple times for pain medication.  He only endorsed shoulder pain to me and did not mention any chest pain. When I explained to him that we would not be using narcotics but could offer him nonnarcotic options to treat his pain, he told me that he was going to leave here and try to "get them on the street."  He refused any further evaluation and I was unable to examine him any further.  He refused all testing.  Discharged patient with instructions to follow-up as planned in the clinic.  Final Clinical Impressions(s) / ED Diagnoses   Final diagnoses:  Chronic left shoulder pain  Drug-seeking behavior    ED Discharge Orders    None       Little, Ambrose Finlandachel Morgan, MD 03/01/18 2014

## 2018-03-16 ENCOUNTER — Inpatient Hospital Stay: Payer: Self-pay | Admitting: Family Medicine

## 2018-04-04 ENCOUNTER — Other Ambulatory Visit: Payer: Self-pay | Admitting: Family Medicine

## 2018-04-19 ENCOUNTER — Ambulatory Visit (INDEPENDENT_AMBULATORY_CARE_PROVIDER_SITE_OTHER): Payer: Medicaid Other | Admitting: Family Medicine

## 2018-04-19 ENCOUNTER — Encounter: Payer: Self-pay | Admitting: Family Medicine

## 2018-04-19 ENCOUNTER — Other Ambulatory Visit: Payer: Self-pay

## 2018-04-19 VITALS — BP 128/80 | HR 84 | Temp 98.5°F | Ht 71.0 in | Wt 140.0 lb

## 2018-04-19 DIAGNOSIS — G8929 Other chronic pain: Secondary | ICD-10-CM

## 2018-04-19 DIAGNOSIS — Z716 Tobacco abuse counseling: Secondary | ICD-10-CM

## 2018-04-19 DIAGNOSIS — Z0000 Encounter for general adult medical examination without abnormal findings: Secondary | ICD-10-CM | POA: Diagnosis not present

## 2018-04-19 DIAGNOSIS — M25512 Pain in left shoulder: Secondary | ICD-10-CM | POA: Diagnosis present

## 2018-04-19 DIAGNOSIS — G894 Chronic pain syndrome: Secondary | ICD-10-CM | POA: Diagnosis not present

## 2018-04-19 DIAGNOSIS — K429 Umbilical hernia without obstruction or gangrene: Secondary | ICD-10-CM | POA: Diagnosis not present

## 2018-04-19 DIAGNOSIS — Z23 Encounter for immunization: Secondary | ICD-10-CM | POA: Diagnosis not present

## 2018-04-19 MED ORDER — GABAPENTIN 600 MG PO TABS: 600.0000 mg | ORAL_TABLET | Freq: Three times a day (TID) | ORAL | 2 refills | Status: DC

## 2018-04-19 MED ORDER — VARENICLINE TARTRATE 0.5 MG X 11 & 1 MG X 42 PO MISC: ORAL | 0 refills | Status: DC

## 2018-04-19 MED ORDER — ALBUTEROL SULFATE HFA 108 (90 BASE) MCG/ACT IN AERS: 2.0000 | INHALATION_SPRAY | Freq: Four times a day (QID) | RESPIRATORY_TRACT | 0 refills | Status: DC | PRN

## 2018-04-19 MED ORDER — DICLOFENAC SODIUM 75 MG PO TBEC: 75.0000 mg | DELAYED_RELEASE_TABLET | Freq: Two times a day (BID) | ORAL | 0 refills | Status: DC

## 2018-04-19 NOTE — Patient Instructions (Addendum)
It was great seeing you again today! I am so sorry that you have had so much trouble getting the referrals. I will replace these and call to see what the status is. For your shoulder we discussed the various options you have. I think getting an mri shoulder will be very helpful in terms of diagnosis. I am suspicious you have a rotator cuff problem which cannot be diagnosed with xray.  We discussed starting chantix for smoking cessation. I have sent this to your pharmacy. I switched your mobic with another anti inflammatory called diclofenac. I am also refilling yoru gabapentin and albuterol.

## 2018-04-20 ENCOUNTER — Telehealth: Payer: Self-pay | Admitting: *Deleted

## 2018-04-20 NOTE — Telephone Encounter (Signed)
Received fax from [facility] requesting prior authorization of Diclofenac tablet .  Form placed in MD's box for completion along with Medicaid formulary.  Fleeger, Maryjo Rochester, CMA

## 2018-04-23 NOTE — Telephone Encounter (Signed)
Prior approval for Diclofenac completed via [employer].  Med approved for 04/23/2018 - 04/23/2019 Prior approval # [id number].  [facility] pharmacy informed.  Kasia Trego, Maryjo Rochester, CMA

## 2018-04-26 ENCOUNTER — Encounter: Payer: Self-pay | Admitting: Family Medicine

## 2018-04-26 DIAGNOSIS — M25512 Pain in left shoulder: Principal | ICD-10-CM

## 2018-04-26 DIAGNOSIS — G8929 Other chronic pain: Secondary | ICD-10-CM | POA: Insufficient documentation

## 2018-04-26 DIAGNOSIS — Z716 Tobacco abuse counseling: Secondary | ICD-10-CM | POA: Insufficient documentation

## 2018-04-26 NOTE — Assessment & Plan Note (Signed)
Pain is suspicious for labrum and supraspinatus pathology. Has had left shoulder xray in the past which showed mild OA but unlikely to account for symptoms. Will get MRI arthrogram to evaluate cuff and labrum.

## 2018-04-26 NOTE — Assessment & Plan Note (Signed)
Started on chantix. Gave counseling on starting induction box of medication.

## 2018-04-26 NOTE — Progress Notes (Signed)
HPI 57 year old male who presents for questions about previously placed referrals. He also presents with chronic left shoulder pain.   Referrals to general surgery and internal referal to pain management placed on 3/7. Patient has still not heard from gen surg and internal referral to pain management was denied.  His shoulder pain is chronic and has been happening off and on for years. He states that lifting his left arm over his head creates excruciating pain. The pain is usually centered over the lateral deltoid and posterior neck. He has tried mobic and ibuprofen as well as several non-medicinal methods but nothing has made it better.  He is also interested in smoking cessation. He has been smoking for more than 20 years. He has heard good things about chantix and is interested in trying that.  CC: referrals and left shoulder pain   ROS:  Review of Systems See HPI for ROS.   CC, SH/smoking status, and VS noted  Objective: BP 128/80   Pulse 84   Temp 98.5 F (36.9 C) (Oral)   Ht 5\' 11"  (1.803 m)   Wt 140 lb (63.5 kg)   SpO2 97%   BMI 19.53 kg/m  Gen: well appearing middle aged caucasian male.NAD, alert, cooperative, and pleasant. HEENT: NCAT, EOMI, PERRL CV: RRR, no murmur Resp: CTAB, no wheezes, non-labored Abd: SNTND, BS present, no guarding or organomegaly Ext: No edema, warm Neuro: Alert and oriented, Speech clear, No gross deficits Left shoulder: Inspection: no focal asymetry noted Palpation: Tenderness to palpation over left ac join and lateral deltoid ROM: able to extend and abduct shoulder to 135, able to get to 180 with passive motion Neurovascular: Palpable peripheral pulses speccial tests: Positive o'briens, empty can test. Unable to do lift off.    Assessment and plan:  Chronic left shoulder pain Pain is suspicious for labrum and supraspinatus pathology. Has had left shoulder xray in the past which showed mild OA but unlikely to account for symptoms.  Will get MRI arthrogram to evaluate cuff and labrum.  Umbilical hernia Referral placed to general surgery for umbilical hernia  Chronic pain syndrome Changing mobic to diclofenac 75mg . Referral resent to external pain management. Continue cymbalta gabapentin.  Healthcare maintenance Flu vaccine and pneumovax given  Encounter for smoking cessation counseling Started on chantix. Gave counseling on starting induction box of medication.   Orders Placed This Encounter  Procedures  . MR Shoulder Left Wo Contrast    Standing Status:   Future    Standing Expiration Date:   06/20/2019    Order Specific Question:   What is the patient's sedation requirement?    Answer:   No Sedation    Order Specific Question:   Does the patient have a pacemaker or implanted devices?    Answer:   No    Order Specific Question:   Preferred imaging location?    Answer:   [facility] (table limit-500 lbs)    Order Specific Question:   Radiology Contrast Protocol - do NOT remove file path    Answer:   [email or web address]  . Flu Vaccine QUAD 36+ mos IM  . Pneumococcal conjugate vaccine 13-valent IM  . Ambulatory referral to General Surgery    Referral Priority:   Routine    Referral Type:   Surgical    Referral Reason:   Specialty Services Required    Requested Specialty:   General Surgery    Number of Visits Requested:   1  . Ambulatory referral  to Pain Clinic    Referral Priority:   Routine    Referral Type:   Consultation    Referral Reason:   Specialty Services Required    Requested Specialty:   Pain Medicine    Number of Visits Requested:   1    Meds ordered this encounter  Medications  . albuterol (PROVENTIL HFA;VENTOLIN HFA) 108 (90 Base) MCG/ACT inhaler    Sig: Inhale 2 puffs into the lungs every 6 (six) hours as needed.    Dispense:  2 Inhaler    Refill:  0  . gabapentin (NEURONTIN) 600 MG tablet    Sig: Take 1 tablet (600 mg total) by mouth 3 (three)  times daily.    Dispense:  60 tablet    Refill:  2  . diclofenac (VOLTAREN) 75 MG EC tablet    Sig: Take 1 tablet (75 mg total) by mouth 2 (two) times daily.    Dispense:  30 tablet    Refill:  0  . varenicline (CHANTIX STARTING MONTH PAK) 0.5 MG X 11 & 1 MG X 42 tablet    Sig: Take one 0.5 mg tablet by mouth once daily for 3 days, then increase to one 0.5 mg tablet twice daily for 4 days, then increase to one 1 mg tablet twice daily.    Dispense:  53 tablet    Refill:  0     Myrene Buddy MD PGY-2 Family Medicine Resident  04/26/2018 8:23 AM

## 2018-04-26 NOTE — Assessment & Plan Note (Signed)
Flu vaccine and pneumovax given

## 2018-04-26 NOTE — Assessment & Plan Note (Signed)
Referral placed to general surgery for umbilical hernia

## 2018-04-26 NOTE — Assessment & Plan Note (Signed)
Changing mobic to diclofenac 75mg . Referral resent to external pain management. Continue cymbalta gabapentin.

## 2018-05-17 ENCOUNTER — Other Ambulatory Visit: Payer: Self-pay

## 2018-08-22 ENCOUNTER — Emergency Department (HOSPITAL_COMMUNITY): Payer: Medicaid Other

## 2018-08-22 ENCOUNTER — Encounter (HOSPITAL_COMMUNITY): Payer: Self-pay | Admitting: Emergency Medicine

## 2018-08-22 ENCOUNTER — Emergency Department (HOSPITAL_COMMUNITY)
Admission: EM | Admit: 2018-08-22 | Discharge: 2018-08-22 | Discharge status: Against Medical Advice | Payer: Medicaid Other | Attending: Emergency Medicine | Admitting: Emergency Medicine

## 2018-08-22 ENCOUNTER — Other Ambulatory Visit: Payer: Self-pay

## 2018-08-22 DIAGNOSIS — F1099 Alcohol use, unspecified with unspecified alcohol-induced disorder: Secondary | ICD-10-CM | POA: Diagnosis present

## 2018-08-22 DIAGNOSIS — G8929 Other chronic pain: Secondary | ICD-10-CM | POA: Diagnosis not present

## 2018-08-22 DIAGNOSIS — M25512 Pain in left shoulder: Secondary | ICD-10-CM | POA: Insufficient documentation

## 2018-08-22 DIAGNOSIS — Z79899 Other long term (current) drug therapy: Secondary | ICD-10-CM | POA: Insufficient documentation

## 2018-08-22 DIAGNOSIS — M545 Low back pain: Secondary | ICD-10-CM | POA: Insufficient documentation

## 2018-08-22 DIAGNOSIS — F10920 Alcohol use, unspecified with intoxication, uncomplicated: Secondary | ICD-10-CM | POA: Diagnosis not present

## 2018-08-22 DIAGNOSIS — F17200 Nicotine dependence, unspecified, uncomplicated: Secondary | ICD-10-CM | POA: Insufficient documentation

## 2018-08-22 LAB — COMPREHENSIVE METABOLIC PANEL
ALT: 24 U/L (ref 0–44)
AST: 36 U/L (ref 15–41)
Albumin: 4.5 g/dL (ref 3.5–5.0)
Alkaline Phosphatase: 67 U/L (ref 38–126)
Anion gap: 14 (ref 5–15)
BUN: 11 mg/dL (ref 6–20)
CO2: 23 mmol/L (ref 22–32)
Calcium: 8.9 mg/dL (ref 8.9–10.3)
Chloride: 102 mmol/L (ref 98–111)
Creatinine, Ser: 0.81 mg/dL (ref 0.61–1.24)
GFR calc non Af Amer: >60 mL/min (ref >60)
GLUCOSE: 92 mg/dL (ref 70–99)
Potassium: 3.6 mmol/L (ref 3.5–5.1)
SODIUM: 139 mmol/L (ref 135–145)
Total Bilirubin: 0.4 mg/dL (ref 0.3–1.2)
Total Protein: 7.6 g/dL (ref 6.5–8.1)

## 2018-08-22 LAB — ETHANOL: ALCOHOL ETHYL (B): 282 mg/dL — AB (ref <10)

## 2018-08-22 LAB — URINALYSIS, ROUTINE W REFLEX MICROSCOPIC
BILIRUBIN URINE: NEGATIVE
GLUCOSE, UA: NEGATIVE mg/dL
HGB URINE DIPSTICK: NEGATIVE
KETONES UR: NEGATIVE mg/dL
Leukocytes,Ua: NEGATIVE
Nitrite: NEGATIVE
PROTEIN: NEGATIVE mg/dL
Specific Gravity, Urine: 1.002 — ABNORMAL LOW (ref 1.005–1.030)
pH: 5 (ref 5.0–8.0)

## 2018-08-22 LAB — RAPID URINE DRUG SCREEN, HOSP PERFORMED
Amphetamines: NOT DETECTED
BARBITURATES: NOT DETECTED
BENZODIAZEPINES: NOT DETECTED
COCAINE: NOT DETECTED
Opiates: NOT DETECTED
TETRAHYDROCANNABINOL: NOT DETECTED

## 2018-08-22 LAB — CBC
HEMATOCRIT: 44.6 % (ref 39.0–52.0)
HEMOGLOBIN: 15.6 g/dL (ref 13.0–17.0)
MCH: 35.1 pg — AB (ref 26.0–34.0)
MCHC: 35 g/dL (ref 30.0–36.0)
MCV: 100.5 fL — ABNORMAL HIGH (ref 80.0–100.0)
Platelets: 253 10*3/uL (ref 150–400)
RBC: 4.44 MIL/uL (ref 4.22–5.81)
RDW: 12.6 % (ref 11.5–15.5)
WBC: 8.8 10*3/uL (ref 4.0–10.5)
nRBC: 0 % (ref 0.0–0.2)

## 2018-08-22 LAB — TROPONIN I: Troponin I: <0.03 ng/mL (ref <0.03)

## 2018-08-22 MED ORDER — CYCLOBENZAPRINE HCL 10 MG PO TABS
5.0000 mg | ORAL_TABLET | Freq: Once | ORAL | Status: AC
  Administered 2018-08-22: 5 mg via ORAL
  Filled 2018-08-22: qty 1

## 2018-08-22 MED ORDER — KETOROLAC TROMETHAMINE 30 MG/ML IJ SOLN
30.0000 mg | Freq: Once | INTRAMUSCULAR | Status: DC
  Filled 2018-08-22: qty 1

## 2018-08-22 MED ORDER — SODIUM CHLORIDE 0.9% FLUSH
3.0000 mL | Freq: Once | INTRAVENOUS | Status: AC
  Administered 2018-08-22: 3 mL via INTRAVENOUS

## 2018-08-22 NOTE — ED Notes (Signed)
Patient signed AMA paperwork. Patient wanted to leave . MD advised for patient to sign AMA paperwork.

## 2018-08-22 NOTE — ED Notes (Signed)
Bed: BF38 Expected date:  Expected time:  Means of arrival:  Comments: EMS 58 yo male left chest pain/arthritis pain/intoxicated

## 2018-08-22 NOTE — ED Provider Notes (Signed)
[facility]-EMERGENCY DEPT Provider Note   CSN: [id number] Arrival date & time: 08/22/18  0423  Time seen 5:00 AM   History   Chief Complaint Chief Complaint  Patient presents with  . Chest Pain  . Back Pain    HPI Carlos Montoya is a 58 y.o. male.  HPI when I asked the patient was going on tonight he states "all of the above".  When I tell him I do not know what all the above is he states he has been having chronic pain and self-medicating with alcohol.  He states he has pain in his hips, heels, left shoulder which he includes his left chest.  He states he has neuropathy and arthritis.  He states he usually drinks 40 ounces several times a week but today he drank 8 of the 12 ounce bottles.  He states he has had the chest pain which is actually his left shoulder pain for a year.  He states he has been having pain since 2 PM that comes and goes and last 20 minutes and he describes it as sharp.  He also says he has pain that sharp and then dull in his back and that radiates down into his legs.  He states if he lays flat on his back it makes the back pain worse.  He states he has been using a cane and he fell either last week or couple weeks ago but states he was not drinking when he fell.  When asking what is different tonight he states "nothing new".  He states his doctor wanted to do an MRI of his left shoulder however he "refuses to go into that tube".  He states he has had left shoulder pain for years.  He also complains of chronic back pain with sciatica.  He then asked me to do a whole-body MRI scan.  He is concerned he has cancer.  He states he was referred to [facility] pain management that but there was some problem with that.  PCP Myrene Buddy, MD Psych Dr Wilson Singer at [place]  Past Medical History:  Diagnosis Date  . Acid reflux   . Alcohol use disorder, mild, abuse   . Degenerative disc disease, lumbar   . Depression   . History of hepatitis C   . Opioid  dependence [facility])     Patient Active Problem List   Diagnosis Date Noted  . Chronic left shoulder pain 04/26/2018  . Encounter for smoking cessation counseling 04/26/2018  . Seasonal allergies 12/18/2017  . Acute otitis media 09/22/2017  . Healthcare maintenance 09/10/2017  . Chronic pain syndrome 09/10/2017  . Moderate episode of recurrent major depressive disorder (HCC) 09/10/2017  . Alcohol use disorder, severe, dependence (HCC) 07/13/2017  . GERD (gastroesophageal reflux disease) 03/11/2017  . Umbilical hernia 03/11/2017  . Degenerative disc disease, lumbar 07/17/2014  . Tobacco abuse 07/17/2014    Past Surgical History:  Procedure Laterality Date  . TONSILLECTOMY          Home Medications    Prior to Admission medications   Medication Sig Start Date End Date Taking? Authorizing Provider  albuterol (PROVENTIL HFA;VENTOLIN HFA) 108 (90 Base) MCG/ACT inhaler Inhale 2 puffs into the lungs every 6 (six) hours as needed. 04/19/18   Myrene Buddy, MD  diclofenac (VOLTAREN) 75 MG EC tablet Take 1 tablet (75 mg total) by mouth 2 (two) times daily. 04/19/18   Myrene Buddy, MD  DULoxetine (CYMBALTA) 30 MG capsule TAKE 1 CAPSULE BY  MOUTH EVERYDAY AT BEDTIME 04/07/18   [provider]  gabapentin (NEURONTIN) 600 MG tablet Take 1 tablet (600 mg total) by mouth 3 (three) times daily. 04/19/18   Myrene Buddy, MD  loratadine (CLARITIN) 10 MG tablet TAKE 1 TABLET BY MOUTH EVERY DAY 02/23/18   Myrene Buddy, MD  omeprazole (PRILOSEC) 20 MG capsule TAKE ONE CAPSULE BY MOUTH EVERY DAY 04/05/18   Myrene Buddy, MD  QUEtiapine (SEROQUEL) 50 MG tablet TAKE 1 TABLET BY MOUTH EVERYDAY AT BEDTIME 04/07/18   [provider]  varenicline (CHANTIX STARTING MONTH PAK) 0.5 MG X 11 & 1 MG X 42 tablet Take one 0.5 mg tablet by mouth once daily for 3 days, then increase to one 0.5 mg tablet twice daily for 4 days, then increase to one 1 mg tablet twice daily. 04/19/18   Myrene Buddy, MD    Family History Family History  Problem Relation Age of Onset  . Diabetes Mother   . Heart disease Father   . Colon cancer Father     Social History Social History   Tobacco Use  . Smoking status: Current Every Day Smoker  . Smokeless tobacco: Never Used  Substance Use Topics  . Alcohol use: Yes  . Drug use: No  Lives at home Lives alone Uses a cane   Allergies   Prednisone and Diphenhydramine hcl   Review of Systems Review of Systems  All other systems reviewed and are negative.    Physical Exam Updated Vital Signs BP (!) 147/103   Pulse 96   Temp 98.1 F (36.7 C) (Oral)   Resp 20   Ht 5\' 11"  (1.803 m)   Wt 68 kg   SpO2 94%   BMI 20.92 kg/m   Vital signs normal    Physical Exam Vitals signs and nursing note reviewed.  Constitutional:      General: He is not in acute distress.    Appearance: He is well-developed.  HENT:     Head: Normocephalic and atraumatic.     Right Ear: External ear normal.     Left Ear: External ear normal.     Nose: Nose normal.     Mouth/Throat:     Mouth: Mucous membranes are dry.  Eyes:     Extraocular Movements: Extraocular movements intact.     Pupils: Pupils are equal, round, and reactive to light.  Neck:     Musculoskeletal: Normal range of motion and neck supple.  Cardiovascular:     Rate and Rhythm: Normal rate and regular rhythm.  Pulmonary:     Effort: Pulmonary effort is normal.  Musculoskeletal:        General: No deformity.     Comments: When patient flexes his knees he complains of pain in his lateral hip/pelvis area.  When I examined his heels he is very tender at the insertion of the Achilles tendon.  I discussed with him about using gel heel pads however he would not listen to me about that.  When I palpate his back he is nontender in the thoracic spine but he is tender diffusely in the whole lumbar spine without localization but he is most tender in the paraspinous muscles bilaterally.    Skin:    General: Skin is warm and dry.  Neurological:     General: No focal deficit present.     Mental Status: He is alert and oriented to person, place, and time.     Cranial Nerves: No cranial nerve deficit.  Psychiatric:        Mood and Affect: Mood normal.        Behavior: Behavior normal.        Thought Content: Thought content normal.      ED Treatments / Results  Labs (all labs ordered are listed, but only abnormal results are displayed) Results for orders placed or performed during the hospital encounter of 08/22/18  CBC  Result Value Ref Range   WBC 8.8 4.0 - 10.5 K/uL   RBC 4.44 4.22 - 5.81 MIL/uL   Hemoglobin 15.6 13.0 - 17.0 g/dL   HCT 56.244.6 13.039.0 - 86.552.0 %   MCV 100.5 (H) 80.0 - 100.0 fL   MCH 35.1 (H) 26.0 - 34.0 pg   MCHC 35.0 30.0 - 36.0 g/dL   RDW 78.412.6 69.611.5 - 29.515.5 %   Platelets 253 150 - 400 K/uL   nRBC 0.0 0.0 - 0.2 %  Troponin I - ONCE - STAT  Result Value Ref Range   Troponin I <0.03 <0.03 ng/mL  Comprehensive metabolic panel  Result Value Ref Range   Sodium 139 135 - 145 mmol/L   Potassium 3.6 3.5 - 5.1 mmol/L   Chloride 102 98 - 111 mmol/L   CO2 23 22 - 32 mmol/L   Glucose, Bld 92 70 - 99 mg/dL   BUN 11 6 - 20 mg/dL   Creatinine, Ser 2.840.81 0.61 - 1.24 mg/dL   Calcium 8.9 8.9 - 13.210.3 mg/dL   Total Protein 7.6 6.5 - 8.1 g/dL   Albumin 4.5 3.5 - 5.0 g/dL   AST 36 15 - 41 U/L   ALT 24 0 - 44 U/L   Alkaline Phosphatase 67 38 - 126 U/L   Total Bilirubin 0.4 0.3 - 1.2 mg/dL   GFR calc non Af Amer >60 >60 mL/min   GFR calc Af Amer >60 >60 mL/min   Anion gap 14 5 - 15  Ethanol  Result Value Ref Range   Alcohol, Ethyl (B) 282 (H) <10 mg/dL  Urine rapid drug screen (hosp performed)  Result Value Ref Range   Opiates NONE DETECTED NONE DETECTED   Cocaine NONE DETECTED NONE DETECTED   Benzodiazepines NONE DETECTED NONE DETECTED   Amphetamines NONE DETECTED NONE DETECTED   Tetrahydrocannabinol NONE DETECTED NONE DETECTED   Barbiturates NONE DETECTED  NONE DETECTED  Urinalysis, Routine w reflex microscopic  Result Value Ref Range   Color, Urine COLORLESS (A) YELLOW   APPearance CLEAR CLEAR   Specific Gravity, Urine 1.002 (L) 1.005 - 1.030   pH 5.0 5.0 - 8.0   Glucose, UA NEGATIVE NEGATIVE mg/dL   Hgb urine dipstick NEGATIVE NEGATIVE   Bilirubin Urine NEGATIVE NEGATIVE   Ketones, ur NEGATIVE NEGATIVE mg/dL   Protein, ur NEGATIVE NEGATIVE mg/dL   Nitrite NEGATIVE NEGATIVE   Leukocytes,Ua NEGATIVE NEGATIVE   Laboratory interpretation all normal except alcohol intoxication    EKG EKG Interpretation  Date/Time:  Sunday August 22 2018 04:27:40 EST Ventricular Rate:  92 PR Interval:    QRS Duration: 107 QT Interval:  380 QTC Calculation: 471 R Axis:   147 Text Interpretation:  Sinus rhythm Right axis deviation Electrode noise No significant change since last tracing 01 Mar 2018 Confirmed by Devoria AlbeKnapp, Cordell Guercio ([id number]) on 08/22/2018 4:36:06 AM   Radiology Dg Chest 2 View  Result Date: 08/22/2018 CLINICAL DATA:  Initial evaluation for acute chest pain. EXAM: CHEST - 2 VIEW COMPARISON:  Prior radiograph from 02/17/2018 FINDINGS: Cardiac and mediastinal silhouettes are stable in  size and contour, and remain within normal limits. Lungs hypoinflated with elevation of the right hemidiaphragm. Associated streaky right basilar opacity most compatible with atelectasis. No other focal airspace disease. No edema or effusion. No pneumothorax. No acute osseous finding. IMPRESSION: 1. Low lung volumes with associated streaky right basilar opacity, most compatible with atelectasis. Superimposed early/mild infiltrate would be difficult to exclude, and could be considered in the correct clinical setting. 2. No other active cardiopulmonary disease. Electronically Signed   By: Rise Mu M.D.   On: 08/22/2018 05:06    Procedures Procedures (including critical care time)  Medications Ordered in ED Medications  ketorolac (TORADOL) 30 MG/ML  injection 30 mg (30 mg Intravenous Refused 08/22/18 0541)  sodium chloride flush (NS) 0.9 % injection 3 mL (3 mLs Intravenous Given 08/22/18 0442)  cyclobenzaprine (FLEXERIL) tablet 5 mg (5 mg Oral Given 08/22/18 0540)     Initial Impression / Assessment and Plan / ED Course  I have reviewed the triage vital signs and the nursing notes.  Pertinent labs & imaging results that were available during my care of the patient were reviewed by me and considered in my medical decision making (see chart for details).    Patient is here for chronic pain that he has had for a long time.  He denies any new injury or anything new about his symptoms.  He has not been very compliant with the medical regimen recommended by his primary care doctor (getting MRI of his chronic pain in his left shoulder).  He was given Toradol and oral low-dose Flexeril for his complaints of pain.  5:45 AM nurse reports patient is refusing the Toradol and wants morphine.  I explained to her which I had already explained to the patient he was not getting any narcotic pain medication.  Patient chose to leave AMA.  Final Clinical Impressions(s) / ED Diagnoses   Final diagnoses:  Alcoholic intoxication without complication (HCC)  Chronic left shoulder pain  Chronic bilateral low back pain, unspecified whether sciatica present    Pt left AMA  Devoria Albe, MD, Concha Pyo, MD 08/22/18 [phone]

## 2018-08-22 NOTE — ED Triage Notes (Signed)
Patient arrived by EMS from home. Patient c/o chest pain, hip pain, and back pain for three months. Patient was referred to pain clinic from primary doctor. Pt states per EMS that he has shooting pain going into LFT shoulder, pain is intermittent.   BP 119/82, HR 96, SpO2 94% RA, CBG 108.   EMS gave 324 Asprin and Nitro.  IV 16 G in LFT AC.

## 2018-08-22 NOTE — ED Notes (Signed)
On arrival, patient Carlos Montoya and cursing. Patient demanding pain medication. Patient states he has chronic pain and he drank 8 beers in an hour because he was scared he was going into DT's. Patient complaining of generalized muscle cramping.

## 2018-09-04 ENCOUNTER — Other Ambulatory Visit: Payer: Self-pay | Admitting: Family Medicine

## 2018-10-15 ENCOUNTER — Other Ambulatory Visit: Payer: Self-pay | Admitting: Family Medicine

## 2018-10-27 ENCOUNTER — Other Ambulatory Visit: Payer: Self-pay

## 2018-10-27 ENCOUNTER — Encounter (HOSPITAL_COMMUNITY): Payer: Self-pay

## 2018-10-27 ENCOUNTER — Emergency Department (HOSPITAL_COMMUNITY)
Admission: EM | Admit: 2018-10-27 | Discharge: 2018-10-27 | Disposition: A | Discharge status: Home / Self Care | Payer: Medicaid Other | Attending: Emergency Medicine | Admitting: Emergency Medicine

## 2018-10-27 ENCOUNTER — Emergency Department (HOSPITAL_COMMUNITY): Payer: Medicaid Other

## 2018-10-27 DIAGNOSIS — K0889 Other specified disorders of teeth and supporting structures: Secondary | ICD-10-CM | POA: Diagnosis present

## 2018-10-27 DIAGNOSIS — G8929 Other chronic pain: Secondary | ICD-10-CM

## 2018-10-27 DIAGNOSIS — F17210 Nicotine dependence, cigarettes, uncomplicated: Secondary | ICD-10-CM | POA: Insufficient documentation

## 2018-10-27 DIAGNOSIS — Z79899 Other long term (current) drug therapy: Secondary | ICD-10-CM | POA: Diagnosis not present

## 2018-10-27 LAB — CBC WITH DIFFERENTIAL/PLATELET
Abs Immature Granulocytes: 0.02 10*3/uL (ref 0.00–0.07)
Basophils Absolute: 0.1 10*3/uL (ref 0.0–0.1)
Basophils Relative: 1 %
Eosinophils Absolute: 0.1 10*3/uL (ref 0.0–0.5)
Eosinophils Relative: 1 %
HCT: 45.1 % (ref 39.0–52.0)
Hemoglobin: 15.6 g/dL (ref 13.0–17.0)
Immature Granulocytes: 0 %
Lymphocytes Relative: 36 %
Lymphs Abs: 2.7 10*3/uL (ref 0.7–4.0)
MCH: 34.9 pg — ABNORMAL HIGH (ref 26.0–34.0)
MCHC: 34.6 g/dL (ref 30.0–36.0)
MCV: 100.9 fL — ABNORMAL HIGH (ref 80.0–100.0)
Monocytes Absolute: 0.4 10*3/uL (ref 0.1–1.0)
Monocytes Relative: 6 %
Neutro Abs: 4.4 10*3/uL (ref 1.7–7.7)
Neutrophils Relative %: 56 %
Platelets: 234 10*3/uL (ref 150–400)
RBC: 4.47 MIL/uL (ref 4.22–5.81)
RDW: 12.3 % (ref 11.5–15.5)
WBC: 7.7 10*3/uL (ref 4.0–10.5)
nRBC: 0 % (ref 0.0–0.2)

## 2018-10-27 LAB — URINALYSIS, ROUTINE W REFLEX MICROSCOPIC
Bilirubin Urine: NEGATIVE
Glucose, UA: NEGATIVE mg/dL
Hgb urine dipstick: NEGATIVE
Ketones, ur: NEGATIVE mg/dL
Leukocytes,Ua: NEGATIVE
Nitrite: NEGATIVE
Protein, ur: NEGATIVE mg/dL
Specific Gravity, Urine: 1.003 — ABNORMAL LOW (ref 1.005–1.030)
pH: 5 (ref 5.0–8.0)

## 2018-10-27 LAB — TROPONIN I: Troponin I: <0.03 ng/mL (ref <0.03)

## 2018-10-27 LAB — COMPREHENSIVE METABOLIC PANEL
ALT: 24 U/L (ref 0–44)
AST: 36 U/L (ref 15–41)
Albumin: 4.3 g/dL (ref 3.5–5.0)
Alkaline Phosphatase: 55 U/L (ref 38–126)
Anion gap: 14 (ref 5–15)
BUN: 7 mg/dL (ref 6–20)
CO2: 23 mmol/L (ref 22–32)
Calcium: 9.2 mg/dL (ref 8.9–10.3)
Chloride: 102 mmol/L (ref 98–111)
Creatinine, Ser: 0.86 mg/dL (ref 0.61–1.24)
GFR calc Af Amer: >60 mL/min (ref >60)
GFR calc non Af Amer: >60 mL/min (ref >60)
Glucose, Bld: 83 mg/dL (ref 70–99)
Potassium: 3.6 mmol/L (ref 3.5–5.1)
Sodium: 139 mmol/L (ref 135–145)
Total Bilirubin: 0.6 mg/dL (ref 0.3–1.2)
Total Protein: 7.3 g/dL (ref 6.5–8.1)

## 2018-10-27 LAB — LIPASE, BLOOD: Lipase: 40 U/L (ref 11–51)

## 2018-10-27 MED ORDER — IBUPROFEN 800 MG PO TABS
800.0000 mg | ORAL_TABLET | Freq: Once | ORAL | Status: AC
  Administered 2018-10-27: 23:00:00 800 mg via ORAL
  Filled 2018-10-27: qty 1

## 2018-10-27 MED ORDER — IBUPROFEN 600 MG PO TABS: 600.0000 mg | ORAL_TABLET | Freq: Four times a day (QID) | ORAL | 0 refills | Status: DC | PRN

## 2018-10-27 MED ORDER — MORPHINE SULFATE (PF) 4 MG/ML IV SOLN
4.0000 mg | Freq: Once | INTRAVENOUS | Status: AC
  Administered 2018-10-27: 21:00:00 4 mg via INTRAVENOUS
  Filled 2018-10-27: qty 1

## 2018-10-27 MED ORDER — DOXYCYCLINE HYCLATE 100 MG PO TABS
100.0000 mg | ORAL_TABLET | Freq: Once | ORAL | Status: AC
  Administered 2018-10-27: 100 mg via ORAL
  Filled 2018-10-27: qty 1

## 2018-10-27 MED ORDER — DOXYCYCLINE HYCLATE 100 MG PO CAPS: 100.0000 mg | ORAL_CAPSULE | Freq: Two times a day (BID) | ORAL | 0 refills | Status: DC

## 2018-10-27 NOTE — ED Provider Notes (Signed)
[facility] EMERGENCY DEPARTMENT Provider Note   CSN: [id number] Arrival date & time: 10/27/18  2016    History   Chief Complaint Chief Complaint  Patient presents with  . Palpitations  . Dental Pain    HPI Carlos Montoya is a 58 y.o. male.     The history is provided by the patient. No language interpreter was used.  Palpitations  Dental Pain     58 year old male with history of alcohol abuse, opioid dependence, hepatitis C, GERD, tobacco use brought here via EMS from home for evaluation of generalized pain.  Patient states for the past several weeks he has had worsening pain of his general degenerative disease.  States that he has pain to his back, throughout his back, hips, and knees.  Pain is sharp achy throbbing similar to prior but more intense.  He also endorsed having dental pain involving his left upper molar for the past week.  Pain is achy, worsening with chewing.  States he was seen by a dentist a week ago and was given amoxicillin in which she has been taken without any relief.  He mention having to go to pain clinic in the past but due to COVID-19 he does not have any access to his pain medication.  He does not complain of any fever, productive cough but does endorse having pain in his chest not improved despite drinking alcohol.  Does not complain of any significant abdominal pain.  Past Medical History:  Diagnosis Date  . Acid reflux   . Alcohol use disorder, mild, abuse   . Degenerative disc disease, lumbar   . Depression   . History of hepatitis C   . Opioid dependence [facility])     Patient Active Problem List   Diagnosis Date Noted  . Chronic left shoulder pain 04/26/2018  . Encounter for smoking cessation counseling 04/26/2018  . Seasonal allergies 12/18/2017  . Acute otitis media 09/22/2017  . Healthcare maintenance 09/10/2017  . Chronic pain syndrome 09/10/2017  . Moderate episode of recurrent major depressive disorder (HCC)  09/10/2017  . Alcohol use disorder, severe, dependence (HCC) 07/13/2017  . GERD (gastroesophageal reflux disease) 03/11/2017  . Umbilical hernia 03/11/2017  . Degenerative disc disease, lumbar 07/17/2014  . Tobacco abuse 07/17/2014    Past Surgical History:  Procedure Laterality Date  . TONSILLECTOMY          Home Medications    Prior to Admission medications   Medication Sig Start Date End Date Taking? Authorizing Provider  diclofenac (VOLTAREN) 75 MG EC tablet Take 1 tablet (75 mg total) by mouth 2 (two) times daily. 04/19/18   Myrene Buddy, MD  DULoxetine (CYMBALTA) 30 MG capsule TAKE 1 CAPSULE BY MOUTH EVERYDAY AT BEDTIME 04/07/18   [provider]  gabapentin (NEURONTIN) 600 MG tablet Take 1 tablet (600 mg total) by mouth 3 (three) times daily. 04/19/18   Myrene Buddy, MD  loratadine (CLARITIN) 10 MG tablet TAKE 1 TABLET BY MOUTH EVERY DAY 02/23/18   Myrene Buddy, MD  omeprazole (PRILOSEC) 20 MG capsule TAKE 1 CAPSULE BY MOUTH EVERY DAY 09/06/18   Myrene Buddy, MD  PROAIR HFA 108 [phone] Base) MCG/ACT inhaler TAKE 2 PUFFS BY MOUTH EVERY 6 HOURS AS NEEDED 10/18/18   Myrene Buddy, MD  QUEtiapine (SEROQUEL) 50 MG tablet TAKE 1 TABLET BY MOUTH EVERYDAY AT BEDTIME 04/07/18   [provider]  varenicline (CHANTIX STARTING MONTH PAK) 0.5 MG X 11 & 1 MG X 42 tablet Take  one 0.5 mg tablet by mouth once daily for 3 days, then increase to one 0.5 mg tablet twice daily for 4 days, then increase to one 1 mg tablet twice daily. 04/19/18   Myrene Buddy, MD    Family History Family History  Problem Relation Age of Onset  . Diabetes Mother   . Heart disease Father   . Colon cancer Father     Social History Social History   Tobacco Use  . Smoking status: Current Every Day Smoker  . Smokeless tobacco: Never Used  Substance Use Topics  . Alcohol use: Yes  . Drug use: No     Allergies   Prednisone and Diphenhydramine hcl   Review of Systems Review of  Systems  Cardiovascular: Positive for palpitations.  All other systems reviewed and are negative.    Physical Exam Updated Vital Signs Temp 98.2 F (36.8 C) (Oral)   Ht 5\' 10"  (1.778 m)   Wt 68 kg   SpO2 96%   BMI 21.52 kg/m   Physical Exam Vitals signs and nursing note reviewed.  Constitutional:      General: He is not in acute distress.    Appearance: He is well-developed.     Comments: Patient appears uncomfortable but nontoxic  HENT:     Head: Atraumatic.     Comments: Mouth: Widespread dental decay, tenderness to tooth #15 with associated dental decay but no gingival erythema or edema, no abscess, no trismus. Eyes:     Conjunctiva/sclera: Conjunctivae normal.  Neck:     Musculoskeletal: Neck supple.  Cardiovascular:     Rate and Rhythm: Normal rate and regular rhythm.     Pulses: Normal pulses.     Heart sounds: Normal heart sounds.  Pulmonary:     Effort: Pulmonary effort is normal.     Breath sounds: No wheezing, rhonchi or rales.  Abdominal:     Palpations: Abdomen is soft.     Tenderness: There is no abdominal tenderness.  Musculoskeletal:        General: Tenderness (Tenderness to palpation along midline lumbar spine, bilateral hip, bilateral knees without decreased range of motion or deformity.) present.  Skin:    Findings: No rash.  Neurological:     Mental Status: He is alert.      ED Treatments / Results  Labs (all labs ordered are listed, but only abnormal results are displayed) Labs Reviewed  CBC WITH DIFFERENTIAL/PLATELET - Abnormal; Notable for the following components:      Result Value   MCV 100.9 (*)    MCH 34.9 (*)    All other components within normal limits  URINALYSIS, ROUTINE W REFLEX MICROSCOPIC - Abnormal; Notable for the following components:   Color, Urine STRAW (*)    Specific Gravity, Urine 1.003 (*)    All other components within normal limits  COMPREHENSIVE METABOLIC PANEL  LIPASE, BLOOD  TROPONIN I    EKG EKG  Interpretation  Date/Time:  Wednesday October 27 2018 20:29:51 EDT Ventricular Rate:  83 PR Interval:    QRS Duration: 108 QT Interval:  387 QTC Calculation: 455 R Axis:   146 Text Interpretation:  Sinus rhythm Probable right ventricular hypertrophy ST-t wave abnormality No significant change since last tracing Abnormal ekg Confirmed by Gerhard Munch [id number]) on 10/27/2018 8:39:44 PM     Radiology Dg Chest 2 View  Result Date: 10/27/2018 CLINICAL DATA:  Chest pain short of breath tachycardia EXAM: CHEST - 2 VIEW COMPARISON:  08/22/2018 FINDINGS: Heart size and  vascularity normal. Small area of left lower lobe atelectasis/infiltrate is new since the prior study. Right lung clear. No effusion or mass. Mild hyperinflation lungs. IMPRESSION: Hyperinflation.  Mild left lower lobe atelectasis or infiltrate. Electronically Signed   By: Marlan Palauharles  Clark M.D.   On: 10/27/2018 21:58    Procedures Procedures (including critical care time)  Medications Ordered in ED Medications  morphine 4 MG/ML injection 4 mg (4 mg Intravenous Given 10/27/18 2112)     Initial Impression / Assessment and Plan / ED Course  I have reviewed the triage vital signs and the nursing notes.  Pertinent labs & imaging results that were available during my care of the patient were reviewed by me and considered in my medical decision making (see chart for details).        BP 123/87   Pulse 85   Temp 98.2 F (36.8 C) (Oral)   Resp 14   Ht 5\' 10"  (1.778 m)   Wt 68 kg   SpO2 95%   BMI 21.52 kg/m    Final Clinical Impressions(s) / ED Diagnoses   Final diagnoses:  Other chronic pain  Pain, dental    ED Discharge Orders         Ordered    doxycycline (VIBRAMYCIN) 100 MG capsule  2 times daily     10/27/18 2305    ibuprofen (ADVIL) 600 MG tablet  Every 6 hours PRN     10/27/18 2305         8:55 PM Patient here with acute on chronic joint pain, complaining of dental pain, as well as pain in his chest.   Does have history of opiate abuse and history of chronic pain.  Endorsed heart palpitation with pain is intense.  On exam, noticed obvious dental abscess.  He is currently on antibiotic for potential dental infection already.  Chest pain is atypical for ACS.  Generalized joint pain is not new.  He is afebrile, vital signs stable and no hypoxia.  Patient requesting for pain medication, I offered Toradol but patient declined.  10:33 PM Patient has normal white count of 7.7, urine without signs of urinary tract infection, labs are reassuring, normal troponin, EKG without concerning changes.  Chest x-ray shows a small area of left lower lobe atelectasis versus infiltrate which is new compared to prior study.  Patient however does not have a fever, no hypoxia and does not complain of any cough to suggest pneumonia.  He does not have any symptoms to suggest COVID-19.  Since patient has continuous dental pain despite being on amoxicillin and unable to follow-up with his dentist, I will prescribe doxycycline to cover for both dental infection and potential pneumonia as well as anti-inflammatory medication for pain.  Encourage patient to follow-up with his doctor for further management of his condition.   Fayrene Helperran, Marynell Bies, PA-C 10/27/18 2310    Gerhard MunchLockwood, Robert, MD 10/28/18 [phone]

## 2018-10-27 NOTE — ED Notes (Signed)
Pt provided urinal. Pt asking this RN for pain medications.

## 2018-10-27 NOTE — Discharge Instructions (Signed)
Please follow up closely with your primary care provider for further management of your ongoing pain.  Take ibuprofen as needed for pain and take doxycycline for dental infection.  Follow up with your dentist for further care.

## 2018-10-27 NOTE — ED Triage Notes (Signed)
Pt having NVD for 1 week. No fever or cough. Feeling heart palpitations. Sharp left side chest pain. Pt also has tooth filling that fell out causing 10/10 pain. Pt has been on amxicillin for 1 week

## 2018-11-09 ENCOUNTER — Other Ambulatory Visit: Payer: Self-pay | Admitting: Family Medicine

## 2018-11-09 NOTE — Telephone Encounter (Signed)
Spoke with patient.  He is agreeable to scheduling an appt an will call back when he is near a calendar. Jone Baseman, CMA

## 2018-11-23 ENCOUNTER — Ambulatory Visit: Payer: Medicaid Other | Admitting: Family Medicine

## 2018-11-23 ENCOUNTER — Other Ambulatory Visit: Payer: Self-pay

## 2018-11-23 MED ORDER — OMEPRAZOLE 20 MG PO CPDR: DELAYED_RELEASE_CAPSULE | ORAL | 1 refills | Status: DC

## 2018-11-30 ENCOUNTER — Other Ambulatory Visit (HOSPITAL_COMMUNITY)
Admission: RE | Admit: 2018-11-30 | Discharge: 2018-11-30 | Disposition: A | Discharge status: Home / Self Care | Payer: Medicaid Other | Source: Ambulatory Visit | Attending: Family Medicine | Admitting: Family Medicine

## 2018-11-30 ENCOUNTER — Other Ambulatory Visit: Payer: Self-pay

## 2018-11-30 ENCOUNTER — Encounter: Payer: Self-pay | Admitting: Family Medicine

## 2018-11-30 ENCOUNTER — Ambulatory Visit (INDEPENDENT_AMBULATORY_CARE_PROVIDER_SITE_OTHER): Payer: Medicaid Other | Admitting: Family Medicine

## 2018-11-30 VITALS — BP 124/78 | HR 96

## 2018-11-30 DIAGNOSIS — M5136 Other intervertebral disc degeneration, lumbar region: Secondary | ICD-10-CM | POA: Diagnosis not present

## 2018-11-30 DIAGNOSIS — Z114 Encounter for screening for human immunodeficiency virus [HIV]: Secondary | ICD-10-CM

## 2018-11-30 DIAGNOSIS — M5441 Lumbago with sciatica, right side: Secondary | ICD-10-CM | POA: Diagnosis present

## 2018-11-30 DIAGNOSIS — K429 Umbilical hernia without obstruction or gangrene: Secondary | ICD-10-CM

## 2018-11-30 DIAGNOSIS — Z113 Encounter for screening for infections with a predominantly sexual mode of transmission: Secondary | ICD-10-CM | POA: Diagnosis not present

## 2018-11-30 DIAGNOSIS — G8929 Other chronic pain: Secondary | ICD-10-CM

## 2018-11-30 DIAGNOSIS — R634 Abnormal weight loss: Secondary | ICD-10-CM

## 2018-11-30 MED ORDER — DULOXETINE HCL 30 MG PO CPEP: ORAL_CAPSULE | ORAL | 2 refills | Status: DC

## 2018-11-30 MED ORDER — IBUPROFEN 600 MG PO TABS: 600.0000 mg | ORAL_TABLET | Freq: Four times a day (QID) | ORAL | 0 refills | Status: DC | PRN

## 2018-11-30 MED ORDER — GABAPENTIN 600 MG PO TABS: 600.0000 mg | ORAL_TABLET | Freq: Three times a day (TID) | ORAL | 2 refills | Status: DC

## 2018-11-30 NOTE — Assessment & Plan Note (Signed)
Scheduled for surgical consultation tomorrow.

## 2018-11-30 NOTE — Assessment & Plan Note (Signed)
Subjective.   Reassured patient about cherry angiomas.   Checking liver function as well as BMET today.   May need CT chest as long-term smoker.   Defer to PCP.

## 2018-11-30 NOTE — Addendum Note (Signed)
Addended by: Lamonte Sakai, Vika Buske D on: 11/30/2018 04:35 PM   Modules accepted: Orders

## 2018-11-30 NOTE — Progress Notes (Signed)
Subjective:    Carlos Montoya is a 58 y.o. male who presents to [facility] today for pain and multiple complaints:  1.  Pain:  "Pain all over."  Worse in back, hips, shoulders, legs.  After some negotiating, we were able to work down to back pain mainly.  Low back pain.  Bilateral.  With sciatica to the Right, burning/numbness Right thigh better with gabapentin.  Has been recommended for pain clinic, but states he "doesn't want opioids" and therefore doesn't want to go.  Feels pain is "worsening" and would like to know if DJD is worsening.  No bladder/bowel incontinence or saddle anesthesia.    2.  Concern for cancer:  Patient convinced he has cancer.  He noticed "bumps" on his chest that are likely cancerous, in his opinion.  Has had some slow, unmeasured weight loss.  Everyday smoker.  No cough or hemoptysis.  No night sweates.  Bumps present for unclear period of time.    - also would like to be screened for STDs.  Patient asx.    ROS as above per HPI.   The following portions of the patient's history were reviewed and updated as appropriate: allergies, current medications, past medical history, family and social history, and problem list. Patient is a smoker.    PMH reviewed.  Past Medical History:  Diagnosis Date  . Acid reflux   . Alcohol use disorder, mild, abuse   . Degenerative disc disease, lumbar   . Depression   . History of hepatitis C   . Opioid dependence (HCC)    Past Surgical History:  Procedure Laterality Date  . TONSILLECTOMY      Medications reviewed. Current Outpatient Medications  Medication Sig Dispense Refill  . diclofenac (VOLTAREN) 75 MG EC tablet Take 1 tablet (75 mg total) by mouth 2 (two) times daily. 30 tablet 0  . doxycycline (VIBRAMYCIN) 100 MG capsule Take 1 capsule (100 mg total) by mouth 2 (two) times daily. One po bid x 7 days 14 capsule 0  . DULoxetine (CYMBALTA) 30 MG capsule TAKE 1 CAPSULE BY MOUTH EVERYDAY AT BEDTIME  2  . gabapentin  (NEURONTIN) 600 MG tablet Take 1 tablet (600 mg total) by mouth 3 (three) times daily. 60 tablet 2  . ibuprofen (ADVIL) 600 MG tablet Take 1 tablet (600 mg total) by mouth every 6 (six) hours as needed. 30 tablet 0  . loratadine (CLARITIN) 10 MG tablet TAKE 1 TABLET BY MOUTH EVERY DAY 30 tablet 0  . omeprazole (PRILOSEC) 20 MG capsule TAKE 1 CAPSULE BY MOUTH EVERY DAY 90 capsule 1  . PROAIR HFA 108 (90 Base) MCG/ACT inhaler INHALE 2 PUFFS BY MOUTH EVERY 6 HOURS AS NEEDED 17 Inhaler 0  . QUEtiapine (SEROQUEL) 50 MG tablet TAKE 1 TABLET BY MOUTH EVERYDAY AT BEDTIME  2  . varenicline (CHANTIX STARTING MONTH PAK) 0.5 MG X 11 & 1 MG X 42 tablet Take one 0.5 mg tablet by mouth once daily for 3 days, then increase to one 0.5 mg tablet twice daily for 4 days, then increase to one 1 mg tablet twice daily. 53 tablet 0   No current facility-administered medications for this visit.      Objective:   Physical Exam BP 124/78   Pulse 96   SpO2 92%  Gen:  Alert, cooperative patient who appears stated age in no acute distress.  Vital signs reviewed. HEENT: EOMI,  MMM Cardiac:  Regular rate and rhythm without murmur auscultated.  Good S1/S2.  Pulm:  Clear to auscultation bilaterally with good air movement.  No wheezes or rales noted.   Abd:  Soft/nondistended.  Umbilical hernia present and fairly irreducible.  Not tender.   Back:  Normal-appearing skin overlying back, no bruising.  Spine with normal alignment and no deformity.  No tenderness to vertebral process palpation.  Paraspinous muscles are TTP BL Lower quadrants.  Range of motion full at neck and decreased lumbar sacral regions.   Neuro:  Sensation and motor function 5/5 bilateral lower extremities.  Ambulates with cane.   Exts: Non edematous BL  LE, warm and well perfused.  Skin:  Capillary hemangiomas/cherry angiomas scattered across chest.  Otherwise no lesions noted.

## 2018-11-30 NOTE — Assessment & Plan Note (Signed)
Ongoing for years.  Feels it is gradually worsening.  Scheduled for MRI to assess.  Present for about 10 years total if not longer.  Continue gabapentin for relief.

## 2018-12-01 ENCOUNTER — Ambulatory Visit: Payer: Medicaid Other | Admitting: Family Medicine

## 2018-12-01 LAB — CBC WITH DIFFERENTIAL/PLATELET
Basophils Absolute: 0.1 10*3/uL (ref 0.0–0.2)
Basos: 1 %
EOS (ABSOLUTE): 0.1 10*3/uL (ref 0.0–0.4)
Eos: 1 %
Hematocrit: 44 % (ref 37.5–51.0)
Hemoglobin: 15.8 g/dL (ref 13.0–17.7)
Immature Grans (Abs): 0 10*3/uL (ref 0.0–0.1)
Immature Granulocytes: 0 %
Lymphocytes Absolute: 2.6 10*3/uL (ref 0.7–3.1)
Lymphs: 31 %
MCH: 35 pg — ABNORMAL HIGH (ref 26.6–33.0)
MCHC: 35.9 g/dL — ABNORMAL HIGH (ref 31.5–35.7)
MCV: 97 fL (ref 79–97)
Monocytes Absolute: 0.7 10*3/uL (ref 0.1–0.9)
Monocytes: 8 %
Neutrophils Absolute: 4.9 10*3/uL (ref 1.4–7.0)
Neutrophils: 59 %
Platelets: 257 10*3/uL (ref 150–450)
RBC: 4.52 x10E6/uL (ref 4.14–5.80)
RDW: 13 % (ref 11.6–15.4)
WBC: 8.4 10*3/uL (ref 3.4–10.8)

## 2018-12-01 LAB — COMPREHENSIVE METABOLIC PANEL
ALT: 36 IU/L (ref 0–44)
AST: 52 IU/L — ABNORMAL HIGH (ref 0–40)
Albumin/Globulin Ratio: 2.3 — ABNORMAL HIGH (ref 1.2–2.2)
Albumin: 4.9 g/dL (ref 3.8–4.9)
Alkaline Phosphatase: 69 IU/L (ref 39–117)
BUN/Creatinine Ratio: 6 — ABNORMAL LOW (ref 9–20)
BUN: 5 mg/dL — ABNORMAL LOW (ref 6–24)
Bilirubin Total: 0.3 mg/dL (ref 0.0–1.2)
CO2: 26 mmol/L (ref 20–29)
Calcium: 9.3 mg/dL (ref 8.7–10.2)
Chloride: 100 mmol/L (ref 96–106)
Creatinine, Ser: 0.87 mg/dL (ref 0.76–1.27)
GFR calc Af Amer: 111 mL/min/{1.73_m2} (ref >59)
GFR calc non Af Amer: 96 mL/min/{1.73_m2} (ref >59)
Globulin, Total: 2.1 g/dL (ref 1.5–4.5)
Glucose: 114 mg/dL — ABNORMAL HIGH (ref 65–99)
Potassium: 4 mmol/L (ref 3.5–5.2)
Sodium: 142 mmol/L (ref 134–144)
Total Protein: 7 g/dL (ref 6.0–8.5)

## 2018-12-01 LAB — HIV ANTIBODY (ROUTINE TESTING W REFLEX): HIV Screen 4th Generation wRfx: NONREACTIVE

## 2018-12-02 ENCOUNTER — Other Ambulatory Visit: Payer: Self-pay | Admitting: General Surgery

## 2018-12-02 LAB — URINE CYTOLOGY ANCILLARY ONLY
Chlamydia: NEGATIVE
Neisseria Gonorrhea: NEGATIVE

## 2018-12-07 ENCOUNTER — Encounter: Payer: Self-pay | Admitting: Family Medicine

## 2018-12-09 ENCOUNTER — Telehealth: Payer: Self-pay

## 2018-12-09 NOTE — Telephone Encounter (Signed)
Patient LVM on nurse line requesting the results of his recent lab work. Please advise. Patient saw Gwendolyn Grant, but will forward to PCP.

## 2018-12-14 ENCOUNTER — Telehealth: Payer: Self-pay

## 2018-12-14 NOTE — Telephone Encounter (Signed)
Please let the patient know that his lab results were completely normal from his last visit.  Guadalupe Dawn MD PGY-2 Family Medicine Resident

## 2018-12-14 NOTE — Telephone Encounter (Signed)
Informed patient of lab results.  Carlos Montoya, [place]

## 2018-12-16 ENCOUNTER — Ambulatory Visit
Admission: RE | Admit: 2018-12-16 | Discharge: 2018-12-16 | Disposition: A | Discharge status: Home / Self Care | Payer: Medicaid Other | Source: Ambulatory Visit | Attending: Family Medicine | Admitting: Family Medicine

## 2018-12-16 ENCOUNTER — Other Ambulatory Visit: Payer: Self-pay

## 2018-12-16 DIAGNOSIS — G8929 Other chronic pain: Secondary | ICD-10-CM

## 2018-12-20 ENCOUNTER — Telehealth: Payer: Self-pay | Admitting: *Deleted

## 2018-12-20 DIAGNOSIS — M5136 Other intervertebral disc degeneration, lumbar region: Secondary | ICD-10-CM

## 2018-12-20 NOTE — Telephone Encounter (Signed)
Pt would like to know the results of his MRI from Thursday. [name], CMA

## 2018-12-21 ENCOUNTER — Other Ambulatory Visit: Payer: Self-pay

## 2018-12-21 ENCOUNTER — Encounter (HOSPITAL_BASED_OUTPATIENT_CLINIC_OR_DEPARTMENT_OTHER): Payer: Self-pay | Admitting: *Deleted

## 2018-12-21 NOTE — Telephone Encounter (Signed)
Pt calls to check status. Jessica Fleeger, CMA  

## 2018-12-22 NOTE — Telephone Encounter (Signed)
Pt called back again. I informed him of below and verified that we have the correct number listed in chart. He said it was the correct number.    Pt would like for Dr. Kris Mouton to call him back as soon as possible. [phone].

## 2018-12-22 NOTE — Telephone Encounter (Signed)
Attempted to call patient x3. Once on 6/16 in the afternoon and 2 times this am. Received message stating that the call could not be completed each time. Have called other patients on same line, so issue appears to be with patients phone. Will continue to attempt trying to reach patient for MRI results.  Guadalupe Dawn MD PGY-2 Family Medicine Resident

## 2018-12-23 NOTE — Addendum Note (Signed)
Addended by: Pauletta Browns on: 12/23/2018 12:28 PM   Modules accepted: Orders

## 2018-12-23 NOTE — Telephone Encounter (Signed)
Able to get through phone this time. Unfortunately got voicemail. Patient does have results on his MRI that I would like to discuss with him so did not leave detailed voicemail. Will continue to try patient later in the day.  Guadalupe Dawn MD PGY-2 Family Medicine Resident

## 2018-12-23 NOTE — Telephone Encounter (Signed)
Patient called in for results. Called patient back within 30 seconds, and did not get answer. Tried patient again 5 minutes later and again got machine. Will continue trying. Left voicemail each time.  Guadalupe Dawn MD PGY-2 Family Medicine Resident

## 2018-12-23 NOTE — Telephone Encounter (Signed)
Able to get in touch with patient.  Apparently is having a problem with his phone.  Called his neighbor's phone number and was able to finally get in touch with patient.  Discussed MRI results.  Show significant disc desiccation at L4/L5, with both left and right nerve impingement.  Is explaining his neuropathy-like symptoms as well as his hip pain.  Discussed options with patient which include surgery, spinal injection.  Patient is not interested in either these options.  Explained that there is not much that I can offer the patient.  Will refer patient to neurosurgery for possible nonoperative management of this issue, as well as explore any options that are available for him.  Patient very appreciative of news.  Of note patient does have hernia surgery scheduled on 12/28/2018.  Will mail patient results, MRI findings, and diagnosis.  Guadalupe Dawn MD PGY-2 Family Medicine Resident

## 2018-12-24 ENCOUNTER — Other Ambulatory Visit (HOSPITAL_COMMUNITY)
Admission: RE | Admit: 2018-12-24 | Discharge: 2018-12-24 | Disposition: A | Discharge status: Home / Self Care | Payer: Medicaid Other | Source: Ambulatory Visit | Attending: General Surgery | Admitting: General Surgery

## 2018-12-24 DIAGNOSIS — Z1159 Encounter for screening for other viral diseases: Secondary | ICD-10-CM | POA: Diagnosis not present

## 2018-12-24 LAB — SARS CORONAVIRUS 2 (TAT 6-24 HRS): SARS Coronavirus 2: NEGATIVE

## 2018-12-25 NOTE — H&P (Signed)
Carlos Montoya [facility] Patient #: [id number] DOB: [birth year] Single / Language: [place] / Race: White Male        History of Present Illness      This is a 58 year old man , referred by [facility] Myrene BuddyJacob Fletcher [facility] for painful umbilical hernia. Present for 2 years. Can't push back in. Pain getting worse and getting larger. Hurts a lot when he has a bowel movement. No prior hernia surgery      Past history significant for hepatitis C, bipolar disorder, PTSD, anxiety disorder, cigarette smoking, chronic pain, left shoulder pain, on disability Social history reveals he lives alone but has friends that can help him. Smokes but trying to quit. Drinks anywhere from 0-3 alcoholic beverages a day. Divorced. No children. 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      Exam reveals tender incarcerated umbilical hernia but no skin changes or infection He will be scheduled for open repair of incarcerated umbilical hernia with mesh I've discussed indications, details, techniques, and numerous risk of the surgery with him. He is aware of the risk of bleeding, infection, recurrence, nerve damage with chronic pain, injury to the intestine with major reconstructive surgery. He understands all of these issues. All of his questions were answered. He agrees with this plan. I reviewed the patient information booklet with him in detail.      I urged him to stop smoking and informed him of the influence that smoking has on wound healing and hernia recurrence    Past Surgical History Colon Polyp Removal - Colonoscopy  Colon Polyp Removal - Open  Tonsillectomy   Diagnostic Studies History  Colonoscopy  within last year  Allergies  No Known Drug Allergies Allergies Reconciled   Medication History  FLUoxetine HCl (10MG  Capsule, Oral) Active. Gabapentin (600MG  Tablet, Oral) Active. Omeprazole (20MG  Capsule DR, Oral) Active. ProAir HFA (108 (90 Base)MCG/ACT Aerosol Soln,  Inhalation) Active. Cymbalta (20MG  Capsule DR Part, Oral) Active. Claritin (10MG  Tablet, Oral) Active. Pantoprazole Sodium (40MG  Tablet DR, Oral) Active. SEROquel (50MG  Tablet, Oral) Active. Medications Reconciled  Social History  Alcohol use  Moderate alcohol use. Caffeine use  Carbonated beverages, Coffee, Tea. Illicit drug use  Remotely quit drug use. Tobacco use  Current some day smoker.  Family History  Alcohol Abuse  Brother, Father, Mother. Anesthetic complications  Family Members In General. Cancer  Family Members In General. Cerebrovascular Accident  Family Members In General. Depression  Brother, Father, Mother. Heart disease in male family member before age 58  Ischemic Bowel Disease  Family Members In General. Malignant Neoplasm Of Pancreas  Family Members In General. Migraine Headache  Family Members In General. Ovarian Cancer  Family Members In General. Prostate Cancer  Family Members In General. Rectal Cancer  Family Members In General. Respiratory Condition  Mother. Seizure disorder  Family Members In General. Thyroid problems  Family Members In General.  Other Problems  Alcohol Abuse  Anxiety Disorder  Arthritis  Atrial Fibrillation  Back Pain  Chest pain  Chronic Obstructive Lung Disease  Depression  Gastroesophageal Reflux Disease  Hemorrhoids  Hepatitis  Kidney Stone  Umbilical Hernia Repair     Review of Systems  General Present- Fatigue. Not Present- Appetite Loss, Chills, Fever, Night Sweats, Weight Gain and Weight Loss. Skin Present- Change in Wart/Mole. Not Present- Dryness, Hives, Jaundice, New Lesions, Non-Healing Wounds, Rash and Ulcer. HEENT Present- Hearing Loss, Ringing in the Ears and Seasonal Allergies. Not Present- Earache, Hoarseness, Nose Bleed, Oral Ulcers, Sinus Pain,  Sore Throat, Visual Disturbances, Wears glasses/contact lenses and Yellow Eyes. Respiratory Present- Difficulty Breathing and  Wheezing. Not Present- Bloody sputum, Chronic Cough and Snoring. Cardiovascular Present- Chest Pain, Leg Cramps, Palpitations, Rapid Heart Rate and Shortness of Breath. Not Present- Difficulty Breathing Lying Down and Swelling of Extremities. Gastrointestinal Present- Abdominal Pain, Change in Bowel Habits, Excessive gas, Indigestion, Nausea, Rectal Pain and Vomiting. Not Present- Bloating, Bloody Stool, Chronic diarrhea, Constipation, Difficulty Swallowing, Gets full quickly at meals and Hemorrhoids. Male Genitourinary Present- Change in Urinary Stream, Painful Urination and Urgency. Not Present- Blood in Urine, Frequency, Impotence, Nocturia and Urine Leakage. Musculoskeletal Present- Back Pain, Joint Pain, Joint Stiffness, Muscle Pain and Muscle Weakness. Not Present- Swelling of Extremities. Neurological Present- Decreased Memory, Numbness, Tingling, Trouble walking and Weakness. Not Present- Fainting, Headaches, Seizures and Tremor. Psychiatric Present- Anxiety, Bipolar, Change in Sleep Pattern, Depression and Fearful. Not Present- Frequent crying. Endocrine Not Present- Cold Intolerance, Excessive Hunger, Hair Changes, Heat Intolerance, Hot flashes and New Diabetes. Hematology Not Present- Blood Thinners, Easy Bruising, Excessive bleeding, Gland problems, HIV and Persistent Infections.  Vitals  Weight: 143.6 lb Height: 71in Body Surface Area: 1.83 m Body Mass Index: 20.03 kg/m  Temp.: 97.14F  Pulse: 123 (Regular)  BP: 144/84(Sitting, Left Arm, Standard)     Physical Exam  General Mental Status-Alert. General Appearance-Consistent with stated age. Hydration-Well hydrated. Voice-Normal. Note: Ambulates with a cane. A little bit disheveled. Oriented. Mental status otherwise normal. Mild anxiety.   Head and Neck Head-normocephalic, atraumatic with no lesions or palpable masses. Trachea-midline. Thyroid Gland Characteristics - normal size and  consistency.  Eye Eyeball - Bilateral-Extraocular movements intact. Sclera/Conjunctiva - Bilateral-No scleral icterus.  Chest and Lung Exam Chest and lung exam reveals -quiet, even and easy respiratory effort with no use of accessory muscles and on auscultation, normal breath sounds, no adventitious sounds and normal vocal resonance. Inspection Chest Wall - Normal. Back - normal.  Cardiovascular Cardiovascular examination reveals -normal heart sounds, regular rate and rhythm with no murmurs and normal pedal pulses bilaterally.  Abdomen Inspection  Inspection of the abdomen reveals: Note: Tender umbilical hernia. Incarcerated fatty tissue suspected with hernia sac about 3 cm but I think the defect is much smaller. No erythema or infection. No other hernias. Skin - Scar - no surgical scars. Palpation/Percussion Palpation and Percussion of the abdomen reveal - Soft, Non Tender, No Rebound tenderness, No Rigidity (guarding) and No hepatosplenomegaly. Auscultation Auscultation of the abdomen reveals - Bowel sounds normal.  Neurologic Neurologic evaluation reveals -alert and oriented x 3 with no impairment of recent or remote memory. Mental Status-Normal.  Musculoskeletal Normal Exam - Left-Upper Extremity Strength Normal and Lower Extremity Strength Normal. Normal Exam - Right-Upper Extremity Strength Normal and Lower Extremity Strength Normal.  Lymphatic Head & Neck  General Head & Neck Lymphatics: Bilateral - Description - Normal. Axillary  General Axillary Region: Bilateral - Description - Normal. Tenderness - Non Tender. Femoral & Inguinal  Generalized Femoral & Inguinal Lymphatics: Bilateral - Description - Normal. Tenderness - Non Tender.    Assessment & Plan  INCARCERATED UMBILICAL HERNIA (K42.0)   You have an incarcerated umbilical hernia This means that there is fatty tissue being pinched by the muscle defect and I cannot push it back in This is  what is causing your pain you request surgical repair of the hernia. That is appropriate.  I strongly recommended you stop smoking, as smoking increases the risk of infection and recurrence of the hernia  Surgery will be done under  general anesthesia you will be able to go home The same day,  you will be scheduled for open repair of incarcerated umbilical hernia with mesh Dr. Dalbert Batman has discussed the indications, techniques, and. Risks of surgery in detail  TOBACCO ABUSE (Z72.0)  BIPOLAR DISORDER (F31.9)  HISTORY OF HEPATITIS C (Z86.19)  CHRONIC PAIN SYNDROME (G89.4) Impression: Gabapentin  ANXIETY DISORDER, UNSPECIFIED TYPE (F41.9)   Danique Hartsough M. Dalbert Batman, M.D., [facility] General and Minimally invasive Surgery Breast and Colorectal Surgery Office:   [phone] Pager:   [phone]

## 2018-12-28 ENCOUNTER — Ambulatory Visit (HOSPITAL_BASED_OUTPATIENT_CLINIC_OR_DEPARTMENT_OTHER): Admission: RE | Admit: 2018-12-28 | Payer: Medicaid Other | Source: Home / Self Care | Admitting: General Surgery

## 2018-12-28 HISTORY — DX: Anxiety disorder, unspecified: F41.9

## 2018-12-28 HISTORY — DX: Chronic obstructive pulmonary disease, unspecified: J44.9

## 2018-12-28 SURGERY — REPAIR, HERNIA, UMBILICAL, ADULT
Anesthesia: General

## 2019-01-25 ENCOUNTER — Other Ambulatory Visit: Payer: Self-pay | Admitting: *Deleted

## 2019-01-26 MED ORDER — IBUPROFEN 600 MG PO TABS: 600.0000 mg | ORAL_TABLET | Freq: Four times a day (QID) | ORAL | 0 refills | Status: DC | PRN

## 2019-01-26 MED ORDER — ALBUTEROL SULFATE HFA 108 (90 BASE) MCG/ACT IN AERS: 2.0000 | INHALATION_SPRAY | Freq: Four times a day (QID) | RESPIRATORY_TRACT | 2 refills | Status: DC | PRN

## 2019-02-25 ENCOUNTER — Ambulatory Visit (INDEPENDENT_AMBULATORY_CARE_PROVIDER_SITE_OTHER): Payer: Medicaid Other | Admitting: Family Medicine

## 2019-02-25 ENCOUNTER — Other Ambulatory Visit: Payer: Self-pay

## 2019-02-25 ENCOUNTER — Encounter: Payer: Self-pay | Admitting: Family Medicine

## 2019-02-25 VITALS — BP 104/64 | HR 92 | Wt 138.0 lb

## 2019-02-25 DIAGNOSIS — Z23 Encounter for immunization: Secondary | ICD-10-CM

## 2019-02-25 DIAGNOSIS — M79671 Pain in right foot: Secondary | ICD-10-CM

## 2019-02-25 DIAGNOSIS — R221 Localized swelling, mass and lump, neck: Secondary | ICD-10-CM | POA: Diagnosis present

## 2019-02-25 DIAGNOSIS — Z0000 Encounter for general adult medical examination without abnormal findings: Secondary | ICD-10-CM

## 2019-02-25 DIAGNOSIS — J4540 Moderate persistent asthma, uncomplicated: Secondary | ICD-10-CM

## 2019-02-25 MED ORDER — FLOVENT HFA 44 MCG/ACT IN AERO: 2.0000 | INHALATION_SPRAY | Freq: Two times a day (BID) | RESPIRATORY_TRACT | 12 refills | Status: DC

## 2019-02-25 MED ORDER — ZOSTER VAC RECOMB ADJUVANTED 50 MCG/0.5ML IM SUSR: 0.5000 mL | Freq: Once | INTRAMUSCULAR | 0 refills | Status: AC

## 2019-02-25 NOTE — Patient Instructions (Signed)
It was great seeing you again today!  Sorry you have been having foot problems.  Think this is due to plantar fascitis.  The best way to treat this is to just rest, take anti-inflammatory medication such as ibuprofen or Tylenol, and apply ice frequently.  I am a little concerned about that neck area so we will get a neck ultrasound to better evaluate it.  I think starting an inhaled corticosteroid for asthma control is a good idea.  You can take Flovent 2 puffs twice a day every day.  I gave you a prescription for a Shingrix vaccine you can take this to any pharmacy and get this filled.  I gave you an excuse and a prescription for an electric wheelchair.  Please let me know if is any other way I can help you with this. Plantar Fasciitis  Plantar fasciitis is a painful foot condition that affects the heel. It occurs when the band of tissue that connects the toes to the heel bone (plantar fascia) becomes irritated. This can happen as the result of exercising too much or doing other repetitive activities (overuse injury). The pain from plantar fasciitis can range from mild irritation to severe pain that makes it difficult to walk or move. The pain is usually worse in the morning after sleeping, or after sitting or lying down for a while. Pain may also be worse after long periods of walking or standing. What are the causes? This condition may be caused by:  Standing for long periods of time.  Wearing shoes that do not have good arch support.  Doing activities that put stress on joints (high-impact activities), including running, aerobics, and ballet.  Being overweight.  An abnormal way of walking (gait).  Tight muscles in the back of your lower leg (calf).  High arches in your feet.  Starting a new athletic activity. What are the signs or symptoms? The main symptom of this condition is heel pain. Pain may:  Be worse with first steps after a time of rest, especially in the morning after  sleeping or after you have been sitting or lying down for a while.  Be worse after long periods of standing still.  Decrease after 30-45 minutes of activity, such as gentle walking. How is this diagnosed? This condition may be diagnosed based on your medical history and your symptoms. Your health care provider may ask questions about your activity level. Your health care provider will do a physical exam to check for:  A tender area on the bottom of your foot.  A high arch in your foot.  Pain when you move your foot.  Difficulty moving your foot. You may have imaging tests to confirm the diagnosis, such as:  X-rays.  Ultrasound.  MRI. How is this treated? Treatment for plantar fasciitis depends on how severe your condition is. Treatment may include:  Rest, ice, applying pressure (compression), and raising the affected foot (elevation). This may be called RICE therapy. Your health care provider may recommend RICE therapy along with over-the-counter pain medicines to manage your pain.  Exercises to stretch your calves and your plantar fascia.  A splint that holds your foot in a stretched, upward position while you sleep (night splint).  Physical therapy to relieve symptoms and prevent problems in the future.  Injections of steroid medicine (cortisone) to relieve pain and inflammation.  Stimulating your plantar fascia with electrical impulses (extracorporeal shock wave therapy). This is usually the last treatment option before surgery.  Surgery, if  other treatments have not worked after 12 months. Follow these instructions at home:  Managing pain, stiffness, and swelling  If directed, put ice on the painful area: ? Put ice in a plastic bag, or use a frozen bottle of water. ? Place a towel between your skin and the bag or bottle. ? Roll the bottom of your foot over the bag or bottle. ? Do this for 20 minutes, 2-3 times a day.  Wear athletic shoes that have air-sole or  gel-sole cushions, or try wearing soft shoe inserts that are designed for plantar fasciitis.  Raise (elevate) your foot above the level of your heart while you are sitting or lying down. Activity  Avoid activities that cause pain. Ask your health care provider what activities are safe for you.  Do physical therapy exercises and stretches as told by your health care provider.  Try activities and forms of exercise that are easier on your joints (low-impact). Examples include swimming, water aerobics, and biking. General instructions  Take over-the-counter and prescription medicines only as told by your health care provider.  Wear a night splint while sleeping, if told by your health care provider. Loosen the splint if your toes tingle, become numb, or turn cold and blue.  Maintain a healthy weight, or work with your health care provider to lose weight as needed.  Keep all follow-up visits as told by your health care provider. This is important. Contact a health care provider if you:  Have symptoms that do not go away after caring for yourself at home.  Have pain that gets worse.  Have pain that affects your ability to move or do your daily activities. Summary  Plantar fasciitis is a painful foot condition that affects the heel. It occurs when the band of tissue that connects the toes to the heel bone (plantar fascia) becomes irritated.  The main symptom of this condition is heel pain that may be worse after exercising too much or standing still for a long time.  Treatment varies, but it usually starts with rest, ice, compression, and elevation (RICE therapy) and over-the-counter medicines to manage pain. This information is not intended to replace advice given to you by your health care provider. Make sure you discuss any questions you have with your health care provider. Document Released: 03/18/2001 Document Revised: 06/05/2017 Document Reviewed: 04/20/2017 Elsevier Patient  Education  2020 [employer]

## 2019-02-28 ENCOUNTER — Telehealth: Payer: Self-pay | Admitting: *Deleted

## 2019-02-28 NOTE — Telephone Encounter (Signed)
Carlos Montoya [facility])  request that we send a script for wheelchair to [facility] in [place].  Pt is unable to take the script that was given to him in clinic.   Phone: [phone] Fax: [phone]  [name], [place]

## 2019-03-03 ENCOUNTER — Ambulatory Visit (HOSPITAL_COMMUNITY)
Admission: RE | Admit: 2019-03-03 | Discharge: 2019-03-03 | Disposition: A | Discharge status: Home / Self Care | Payer: Medicaid Other | Source: Ambulatory Visit | Attending: Family Medicine | Admitting: Family Medicine

## 2019-03-03 ENCOUNTER — Encounter: Payer: Self-pay | Admitting: Family Medicine

## 2019-03-03 ENCOUNTER — Other Ambulatory Visit: Payer: Self-pay

## 2019-03-03 DIAGNOSIS — M79673 Pain in unspecified foot: Secondary | ICD-10-CM | POA: Insufficient documentation

## 2019-03-03 DIAGNOSIS — R221 Localized swelling, mass and lump, neck: Secondary | ICD-10-CM | POA: Insufficient documentation

## 2019-03-03 DIAGNOSIS — J45909 Unspecified asthma, uncomplicated: Secondary | ICD-10-CM | POA: Insufficient documentation

## 2019-03-03 DIAGNOSIS — R591 Generalized enlarged lymph nodes: Secondary | ICD-10-CM | POA: Insufficient documentation

## 2019-03-03 NOTE — Assessment & Plan Note (Signed)
Gave flu vaccine and shingrex script.

## 2019-03-03 NOTE — Assessment & Plan Note (Signed)
Albuterol prn. flovent 82mcg 2 puffs bid

## 2019-03-03 NOTE — Assessment & Plan Note (Signed)
Right neck swelling. Likely lymphadenopathy. Will get neck ultrasound to further evaluate.

## 2019-03-03 NOTE — Progress Notes (Signed)
   HPI 58 year old male who presents with right foot pain. He also would like to be evaluated for right neck swelling.  The patient states that he is having right midfoot pain. It is typically worse in the morning. It comes and goes and feels like a dull ache. He hasn't taken anything for the pain. Sensation intact.  Patient has had a large right sided neck mass. He states that it has been there for several months, but it has started getting larger recently. He states that it is not larger.  Patient is also interested in starting a controller medication for his asthma. He states that he has been wheezing more frquently than usual recently.  CC: right neck swelling and foot pain  ROS:   Review of Systems See HPI for ROS.   CC, SH/smoking status, and VS noted  Objective: BP 104/64   Pulse 92   Wt 138 lb (62.6 kg)   SpO2 96%   BMI 19.80 kg/m  Gen: 58 year old caucasian male, no acute distress HEENT: palpable 1.5cm length lymph node. Cannot appreciate depth due to anatomy. Freely mobile CV: RRR, no murmur Resp: CTAB, no wheezes, non-labored Neuro: Alert and oriented, Speech clear, No gross deficits Right foot: no asymmetry. Palpable tenderness to right midfoot.   Assessment and plan:  Asthma Albuterol prn. flovent 42mcg 2 puffs bid  Foot pain Right foot pain likely 2/2 plantar fasciitis based on exam and history. Supportive care, can take tylenol for pain. Discussed stretching he can do to help alleviate symptoms.  Healthcare maintenance Gave flu vaccine and shingrex script.  Neck swelling Right neck swelling. Likely lymphadenopathy. Will get neck ultrasound to further evaluate.   Orders Placed This Encounter  Procedures  . US Soft Tissue Head/Neck    Standing Status:   Future    Number of Occurrences:   1    Standing Expiration Date:   04/26/2020    Order Specific Question:   Reason for Exam (SYMPTOM  OR DIAGNOSIS REQUIRED)    Answer:   palpable mass    Order  Specific Question:   Preferred imaging location?    Answer:   [facility]  . Flu Vaccine QUAD 36+ mos IM    Meds ordered this encounter  Medications  . Zoster Vaccine Adjuvanted [facility]) injection    Sig: Inject 0.5 mLs into the muscle once for 1 dose.    Dispense:  0.5 mL    Refill:  0  . fluticasone (FLOVENT HFA) 44 MCG/ACT inhaler    Sig: Inhale 2 puffs into the lungs 2 (two) times daily.    Dispense:  1 Inhaler    Refill:  12     Guadalupe Dawn MD PGY-3 Family Medicine Resident  03/03/2019 11:49 PM

## 2019-03-03 NOTE — Assessment & Plan Note (Signed)
Right foot pain likely 2/2 plantar fasciitis based on exam and history. Supportive care, can take tylenol for pain. Discussed stretching he can do to help alleviate symptoms.

## 2019-03-07 ENCOUNTER — Telehealth: Payer: Self-pay | Admitting: Family Medicine

## 2019-03-07 NOTE — Telephone Encounter (Signed)
Please let the patient know that his neck ultrasound looked good. That mass we were concerned about was one larger lymph node that did not have any concerning features. We will just monitor it at subsequent visits and if it does not shrink over the next couple of months we can have it biopsied.   Carlos Dawn MD PGY-3 Family Medicine Resident

## 2019-03-08 NOTE — Telephone Encounter (Signed)
LMOVM for pt to call us back. Deseree Blount, CMA  

## 2019-03-11 ENCOUNTER — Telehealth: Payer: Self-pay

## 2019-03-11 NOTE — Telephone Encounter (Signed)
Patient called and was given results of his ultrasound.  Patient verbally understands.  Ozella Almond, [place]

## 2019-03-21 ENCOUNTER — Telehealth: Payer: Self-pay | Admitting: Family Medicine

## 2019-03-21 NOTE — Telephone Encounter (Signed)
Patient called into office to see if he could get a referral for an biopsy, I suggested that pt make an apt to see Dr. Kris Mouton and he refused. Please give pt a call back.

## 2019-04-15 ENCOUNTER — Other Ambulatory Visit: Payer: Self-pay

## 2019-04-16 MED ORDER — IBUPROFEN 600 MG PO TABS: 600.0000 mg | ORAL_TABLET | Freq: Four times a day (QID) | ORAL | 0 refills | Status: DC | PRN

## 2019-04-20 ENCOUNTER — Other Ambulatory Visit: Payer: Self-pay

## 2019-04-20 ENCOUNTER — Ambulatory Visit (INDEPENDENT_AMBULATORY_CARE_PROVIDER_SITE_OTHER): Payer: Medicaid Other | Admitting: Family Medicine

## 2019-04-20 VITALS — BP 120/80 | HR 94 | Wt 142.0 lb

## 2019-04-20 DIAGNOSIS — R591 Generalized enlarged lymph nodes: Secondary | ICD-10-CM | POA: Diagnosis not present

## 2019-04-20 DIAGNOSIS — K429 Umbilical hernia without obstruction or gangrene: Secondary | ICD-10-CM

## 2019-04-20 DIAGNOSIS — G894 Chronic pain syndrome: Secondary | ICD-10-CM

## 2019-04-20 DIAGNOSIS — M79671 Pain in right foot: Secondary | ICD-10-CM

## 2019-04-20 DIAGNOSIS — M722 Plantar fascial fibromatosis: Secondary | ICD-10-CM

## 2019-04-20 NOTE — Patient Instructions (Signed)
It was great seeing you again!  I think it would be worth getting a surgery perspective on your lymph node.  It does feel like it has gotten a little bit bigger.  In terms of your foot pain I think having you go to see a sports medicine specialist to get orthotics made will be the best treatment.  For your chronic lower back and hip pain I do recommend going to see pain management to see what options they can offer you.

## 2019-04-25 ENCOUNTER — Encounter: Payer: Self-pay | Admitting: Family Medicine

## 2019-04-25 NOTE — Assessment & Plan Note (Signed)
Patient with chronic lower back and hip pain.  Would likely benefit from chronic pain management.  Did have her appointment scheduled with them but canceled it as he did not thought they could help him.  He has now reinterested in seeing them.  He will call to schedule appointment.

## 2019-04-25 NOTE — Progress Notes (Signed)
HPI 58 year old male who presents for neck swelling.  Patient states that he leaves he has a lymph node that has gotten bigger over the past few months.  Located in his right anterior neck just lateral to midline.  Patient first voiced this concern to me on 02/25/2019.  At the time I noted a 1.5 cm length lymph node.  He had a neck ultrasound which showed normal lymph node architecture.  Patient also complains of bilateral heel pain.  He states that the pain gets much worse when he walks and is active on his feet.  He had tried anything for the pain.  The pain is worse at the end of the day.  He has tried a variety of different shoes but these have not helped him.  Patient also has chronic lower back pain.  I did get the patient set up with a visit with chronic pain to see what they can offer him but he did not keep that appointment.  He is now interested in going there.  Lastly the patient was to undergo an elective umbilical hernia repair.  This is back in July but he canceled that due to Covid concerns.  He states that this is not giving him any issues at this time and he will wait till Covid "blows over" to get this fixed.  CC: Lymph node, bilateral heel pain   ROS:   Review of Systems See HPI for ROS.   CC, SH/smoking status, and VS noted  Objective: BP 120/80   Pulse 94   Wt 142 lb (64.4 kg)   SpO2 96%   BMI 20.37 kg/m  Gen: Pleasant 58 year old Caucasian male, no acute distress, resting comfortably HEENT: Palpable cervical lymph node which is actually decreased in size, likely less than 1 cm based on exam. CV: Regular rate and rhythm, no M/R/G Resp: Lungs clear to auscultation bilaterally, no accessory muscle use Neuro: Alert and oriented, Speech clear, No gross deficits Bilateral feet: Palpable tenderness at posterior foot, or distal to the heel, likely close to the insertion of the plantar fascia into the calcaneus.   Assessment and plan:  Umbilical hernia Patient is  waiting to get his umbilical hernia repaired until "Covid blows over".  His symptoms apparently resolved and is not an issue at this time.  Foot pain Patient continues to complain of bilateral heel pain, close to the insertion of plantar fascia to the calcaneus.  Believe he would get relief from orthotic creation.  Place referral to sports medicine clinic for orthotic.  Discussed repeating the stretching exercises as well as taking Tylenol for his pain.  Chronic pain syndrome Patient with chronic lower back and hip pain.  Would likely benefit from chronic pain management.  Did have her appointment scheduled with them but canceled it as he did not thought they could help him.  He has now reinterested in seeing them.  He will call to schedule appointment.  Lymphadenopathy Patient is lymph node actually feels smaller on exam today.  Likely less than 1 cm in diameter.  Likely less than 1/2 cm depth.  Patient is still interested in a surgery referral to have this biopsied.  Has pre-existing relationship with [facility] [place] [facility].  Can call them to schedule appointment.  Replaced referral just in case he still needs it.   Orders Placed This Encounter  Procedures  . Ambulatory referral to Sports Medicine    Referral Priority:   Routine    Referral Type:   Consultation  Number of Visits Requested:   1  . Ambulatory referral to General Surgery    Referral Priority:   Routine    Referral Type:   Surgical    Referral Reason:   Specialty Services Required    Requested Specialty:   General Surgery    Number of Visits Requested:   1    No orders of the defined types were placed in this encounter.    Guadalupe Dawn MD PGY-3 Family Medicine Resident  04/25/2019 4:54 PM

## 2019-04-25 NOTE — Assessment & Plan Note (Signed)
Patient is waiting to get his umbilical hernia repaired until "Covid blows over".  His symptoms apparently resolved and is not an issue at this time.

## 2019-04-25 NOTE — Assessment & Plan Note (Signed)
Patient continues to complain of bilateral heel pain, close to the insertion of plantar fascia to the calcaneus.  Believe he would get relief from orthotic creation.  Place referral to sports medicine clinic for orthotic.  Discussed repeating the stretching exercises as well as taking Tylenol for his pain.

## 2019-04-25 NOTE — Assessment & Plan Note (Signed)
Patient is lymph node actually feels smaller on exam today.  Likely less than 1 cm in diameter.  Likely less than 1/2 cm depth.  Patient is still interested in a surgery referral to have this biopsied.  Has pre-existing relationship with [facility] [place] [facility].  Can call them to schedule appointment.  Replaced referral just in case he still needs it.

## 2019-05-02 NOTE — Telephone Encounter (Signed)
Addressed at most recent clinic visit  Shateria Paternostro MD PGY-3 Family Medicine Resident 

## 2019-05-12 ENCOUNTER — Other Ambulatory Visit: Payer: Self-pay | Admitting: General Surgery

## 2019-05-27 ENCOUNTER — Other Ambulatory Visit: Payer: Self-pay

## 2019-05-27 ENCOUNTER — Encounter (HOSPITAL_BASED_OUTPATIENT_CLINIC_OR_DEPARTMENT_OTHER): Payer: Self-pay | Admitting: *Deleted

## 2019-06-03 ENCOUNTER — Inpatient Hospital Stay (HOSPITAL_COMMUNITY): Admission: RE | Admit: 2019-06-03 | Payer: Medicaid Other | Source: Ambulatory Visit

## 2019-06-07 ENCOUNTER — Ambulatory Visit (HOSPITAL_BASED_OUTPATIENT_CLINIC_OR_DEPARTMENT_OTHER): Admission: EM | Admit: 2019-06-07 | Payer: Medicaid Other | Source: Ambulatory Visit | Admitting: General Surgery

## 2019-06-07 HISTORY — DX: Umbilical hernia without obstruction or gangrene: K42.9

## 2019-06-07 HISTORY — DX: Bipolar disorder, unspecified: F31.9

## 2019-06-07 SURGERY — REPAIR, HERNIA, UMBILICAL, ADULT
Anesthesia: General

## 2019-06-08 ENCOUNTER — Other Ambulatory Visit: Payer: Self-pay

## 2019-06-08 MED ORDER — GABAPENTIN 600 MG PO TABS: 600.0000 mg | ORAL_TABLET | Freq: Three times a day (TID) | ORAL | 2 refills | Status: DC

## 2019-07-07 ENCOUNTER — Other Ambulatory Visit: Payer: Self-pay | Admitting: Family Medicine

## 2019-07-11 ENCOUNTER — Other Ambulatory Visit: Payer: Self-pay | Admitting: *Deleted

## 2019-07-11 MED ORDER — OMEPRAZOLE 20 MG PO CPDR: DELAYED_RELEASE_CAPSULE | ORAL | 1 refills | Status: DC

## 2019-07-11 MED ORDER — ALBUTEROL SULFATE HFA 108 (90 BASE) MCG/ACT IN AERS: 2.0000 | INHALATION_SPRAY | Freq: Four times a day (QID) | RESPIRATORY_TRACT | 2 refills | Status: DC | PRN

## 2019-08-05 IMAGING — DX DG CHEST 1V PORT
1 series · 1 of 1 positions shown · non-contrast
Comparison: None.

CLINICAL DATA: Chest and LEFT shoulder pain for a few hours.
History of substance abuse, smoker.

EXAM:
PORTABLE CHEST 1 VIEW

[chest ap]
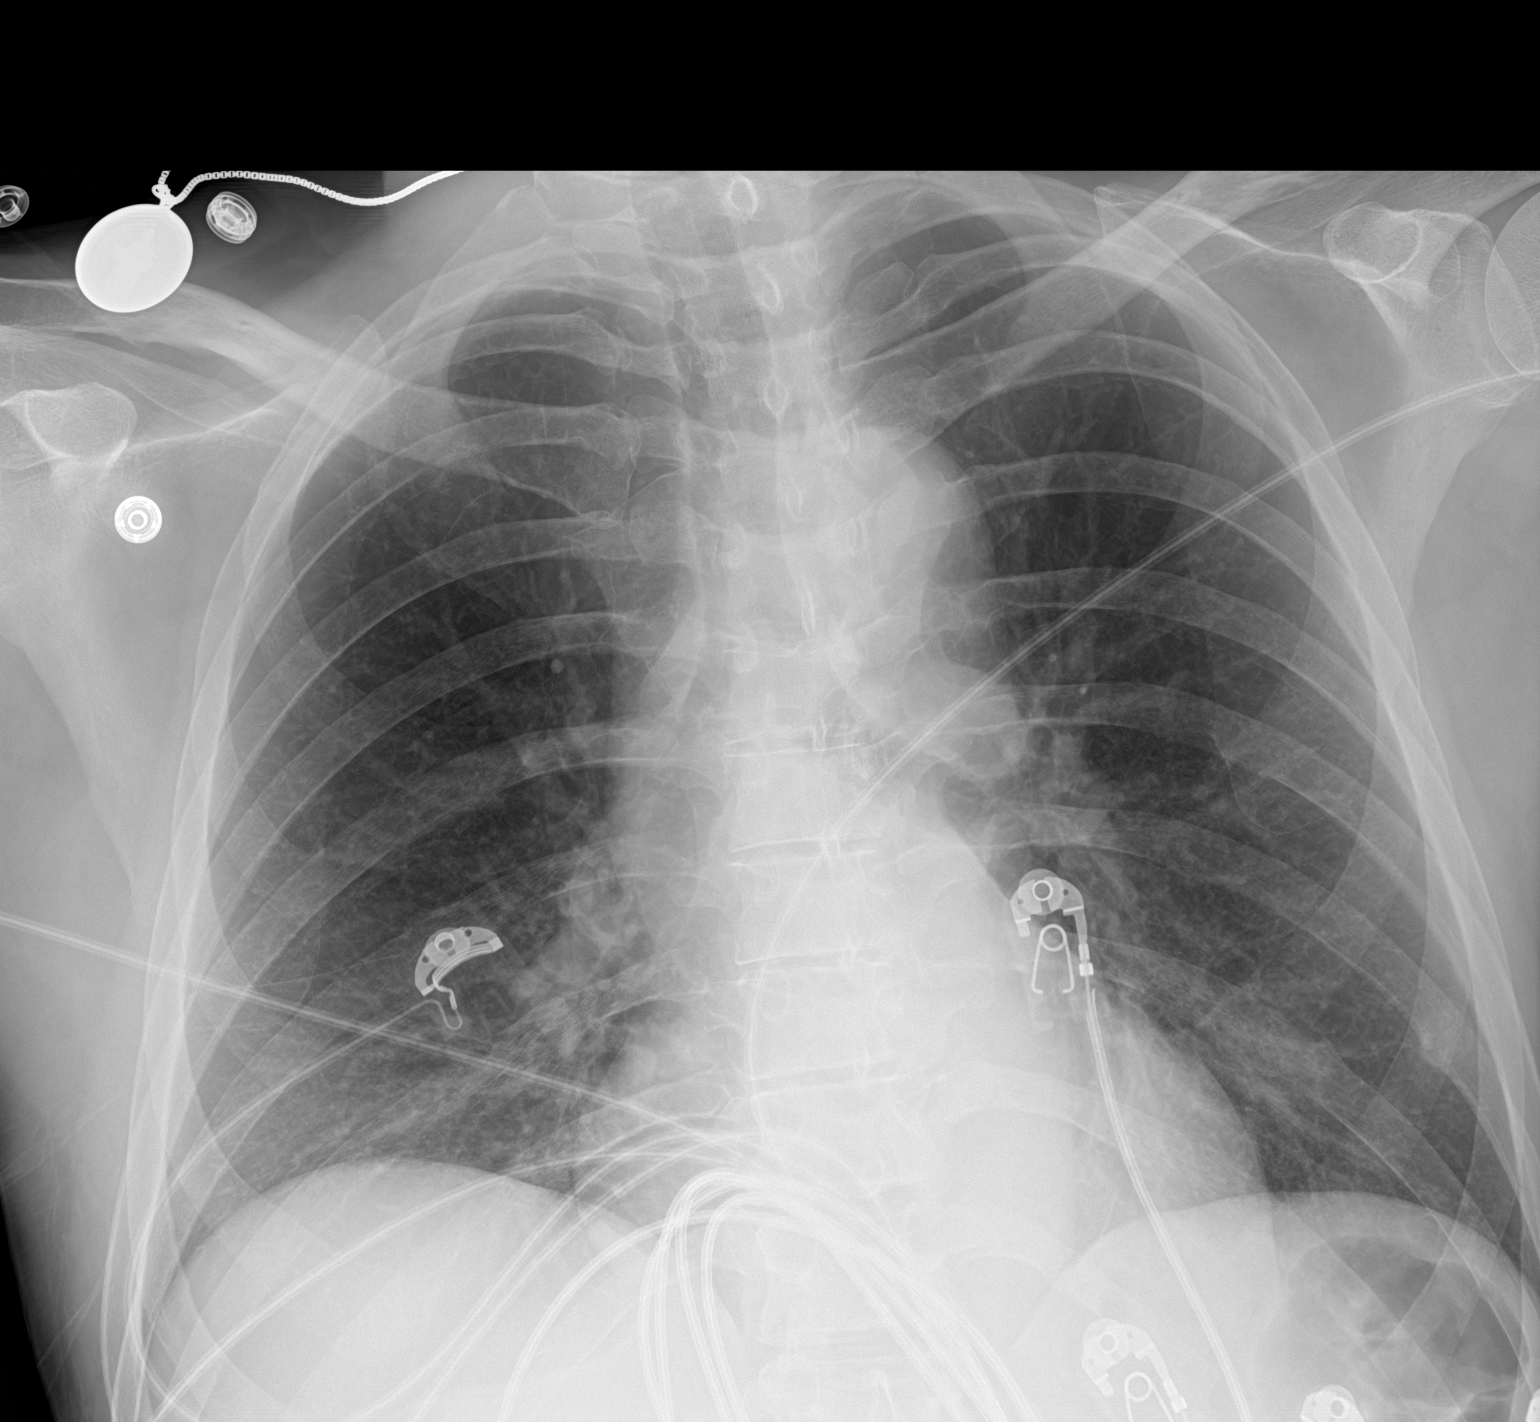

[1 of 1 positions shown; findings below may reference images not displayed]

FINDINGS: Cardiac silhouette is mildly enlarged. Mediastinal silhouette is not
suspicious. Bronchitic changes with bibasilar strandy densities. No
pleural effusion or focal consolidation. No pneumothorax. Nodular
density LEFT lung base. No pneumothorax. Soft tissue planes are non
suspicious.
IMPRESSION: 1. Mild cardiomegaly.
2. Bronchitic changes with bibasilar atelectasis/scarring.
3. LEFT lung base nodular density could reflect nipple shadow,
confluence of osseous shadows or parenchymal nodule. Recommend
follow-up PA and lateral views of the chest with nipple markers when
clinically able.

## 2019-11-10 ENCOUNTER — Other Ambulatory Visit: Payer: Self-pay | Admitting: Family Medicine

## 2019-11-25 ENCOUNTER — Encounter: Payer: Self-pay | Admitting: Family Medicine

## 2020-01-04 ENCOUNTER — Other Ambulatory Visit: Payer: Self-pay | Admitting: Family Medicine

## 2020-04-03 ENCOUNTER — Ambulatory Visit: Payer: Medicaid Other | Admitting: Student in an Organized Health Care Education/Training Program

## 2020-06-06 ENCOUNTER — Other Ambulatory Visit: Payer: Self-pay | Admitting: Family Medicine

## 2020-08-06 ENCOUNTER — Ambulatory Visit: Payer: Medicaid Other | Admitting: Family Medicine

## 2020-08-17 ENCOUNTER — Other Ambulatory Visit: Payer: Self-pay | Admitting: Family Medicine

## 2020-08-18 IMAGING — US SOFT TISSUE ULTRASOUND HEAD/NECK
1 series · 9 of 9 positions shown · non-contrast
Comparison: None.

CLINICAL DATA: 57-year-old male with a history of right lateral
neck mass

EXAM:
ULTRASOUND OF HEAD/NECK SOFT TISSUES
TECHNIQUE: Ultrasound examination of the head and neck soft tissues was
performed in the area of clinical concern.

[Series 1: soft tissue ultrasound head/neck · 9 of 9 slices shown]
[im 1/9]
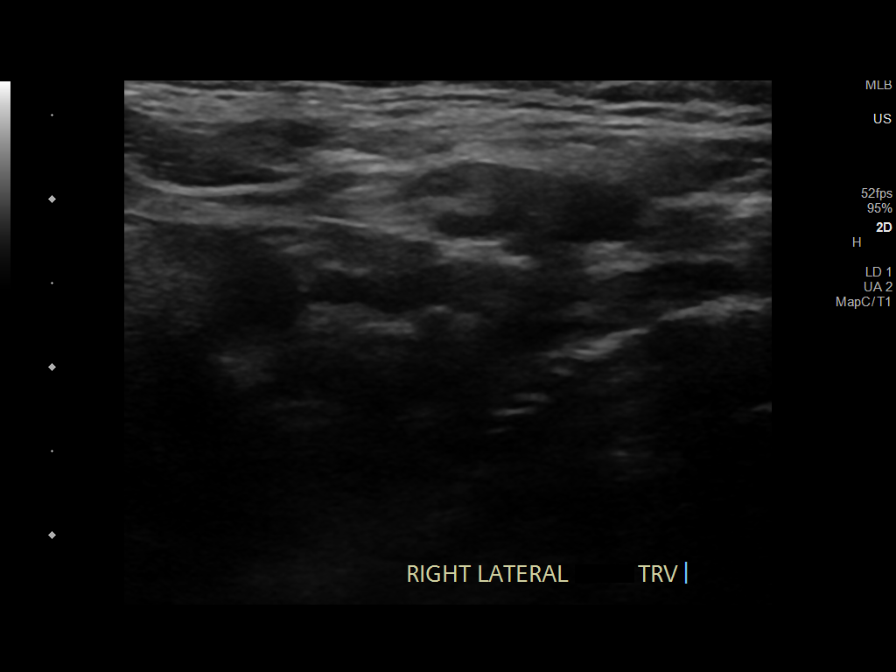
[im 2/9]
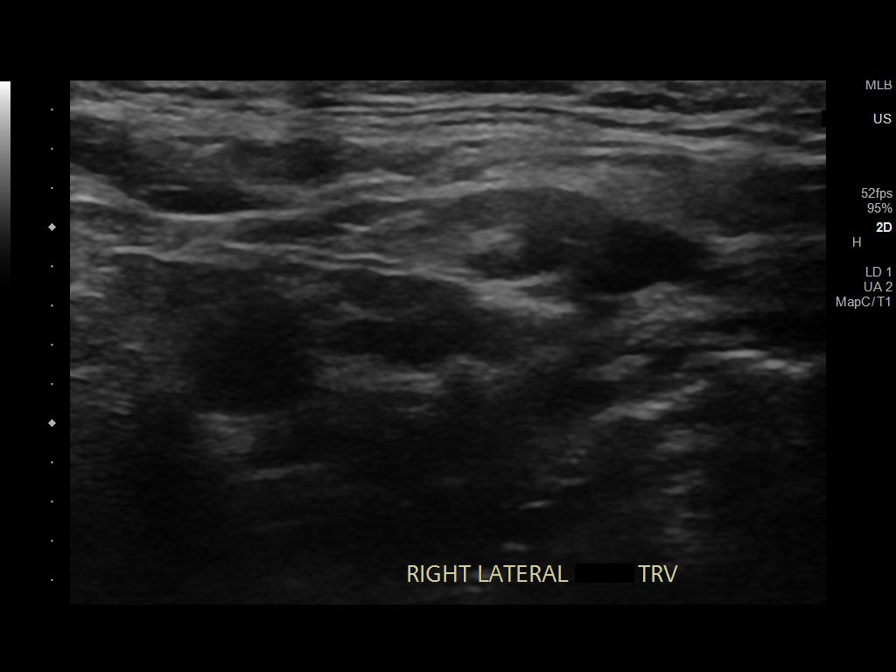
[im 3/9]
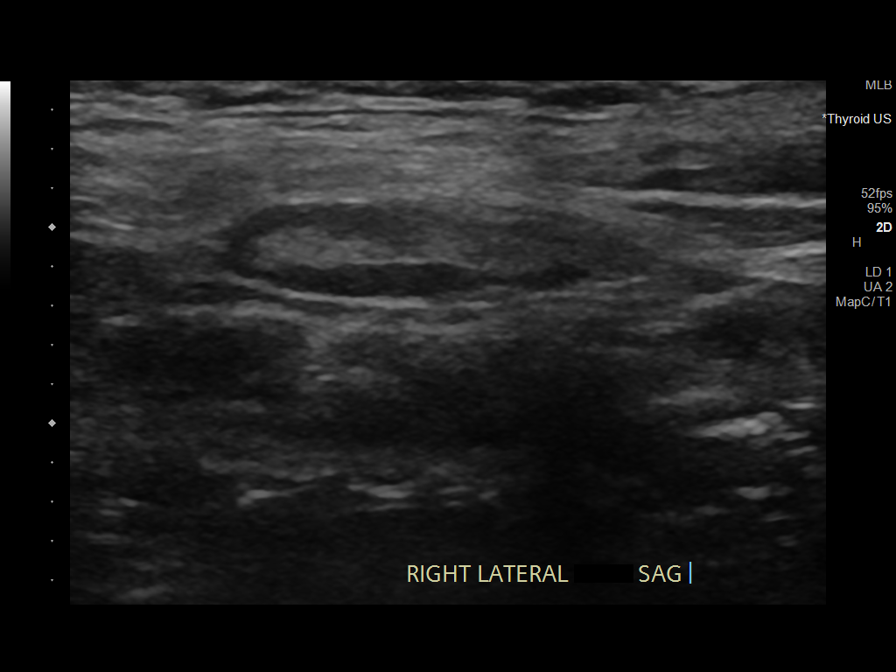
[im 4/9]
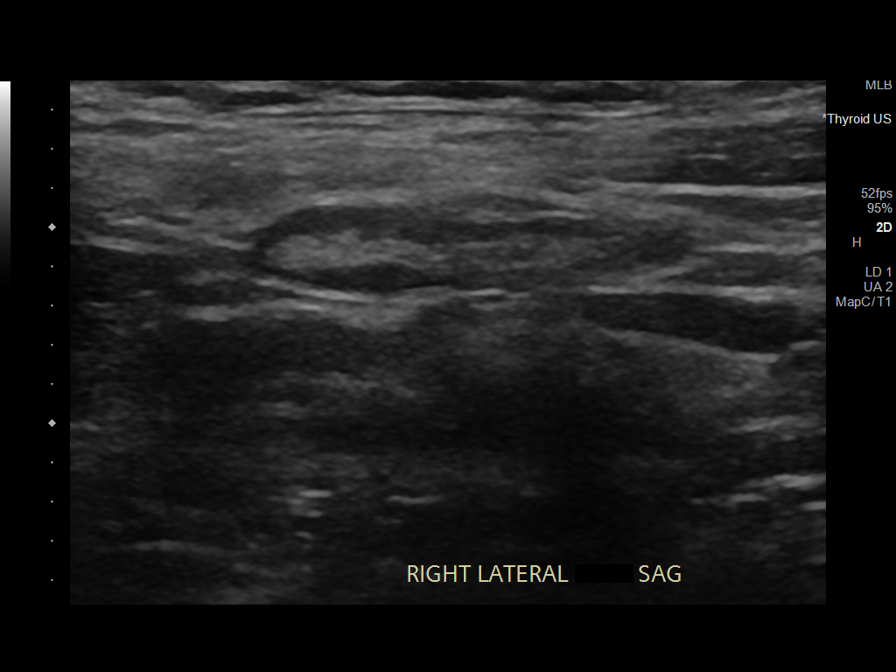
[im 5/9]
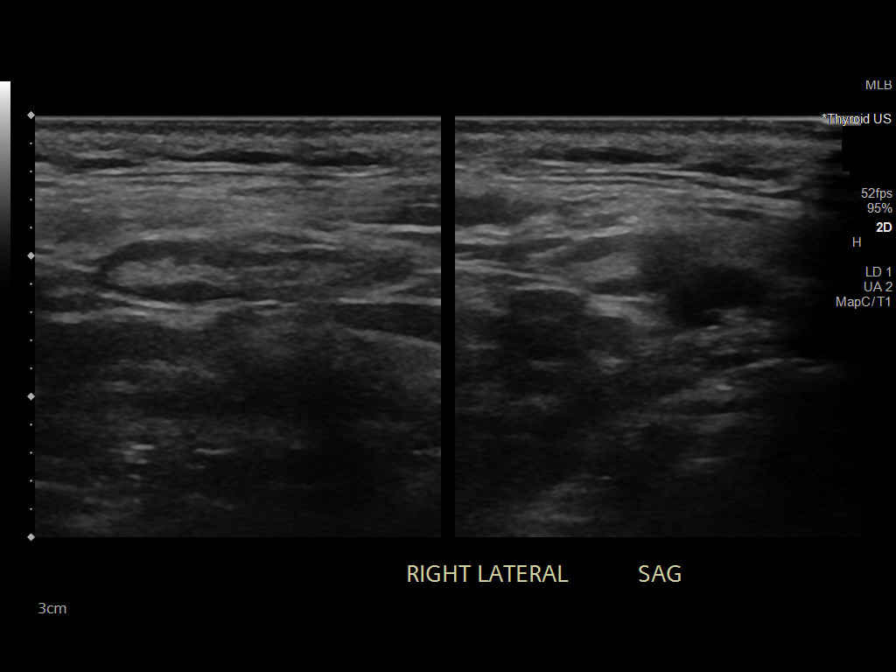
[im 6/9]
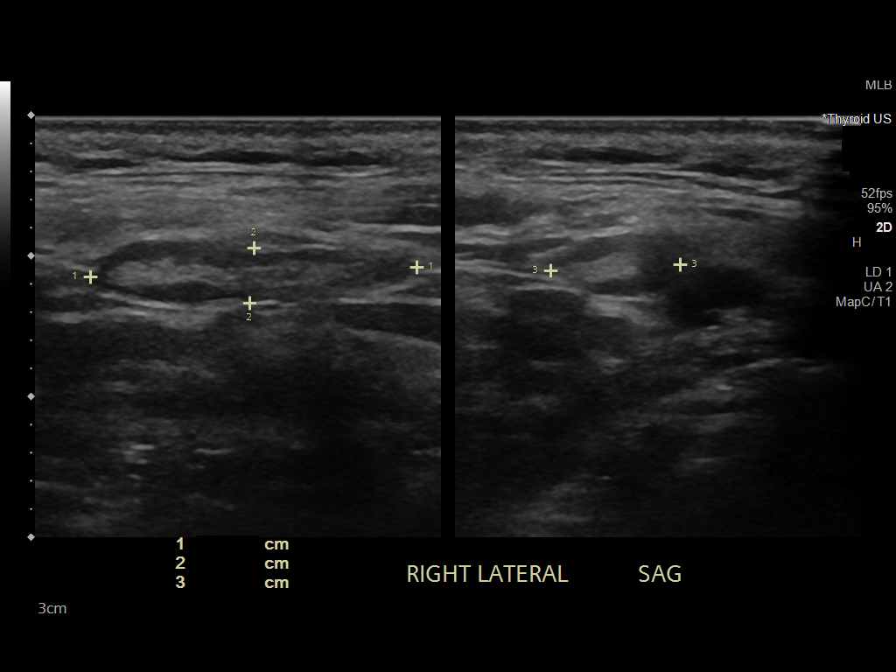
[im 7/9]
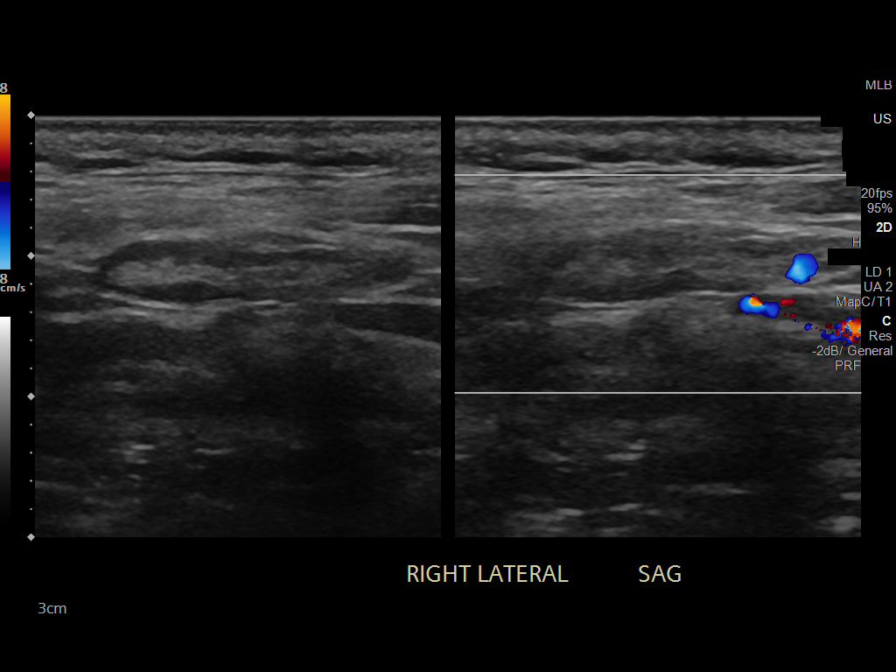
[im 8/9]
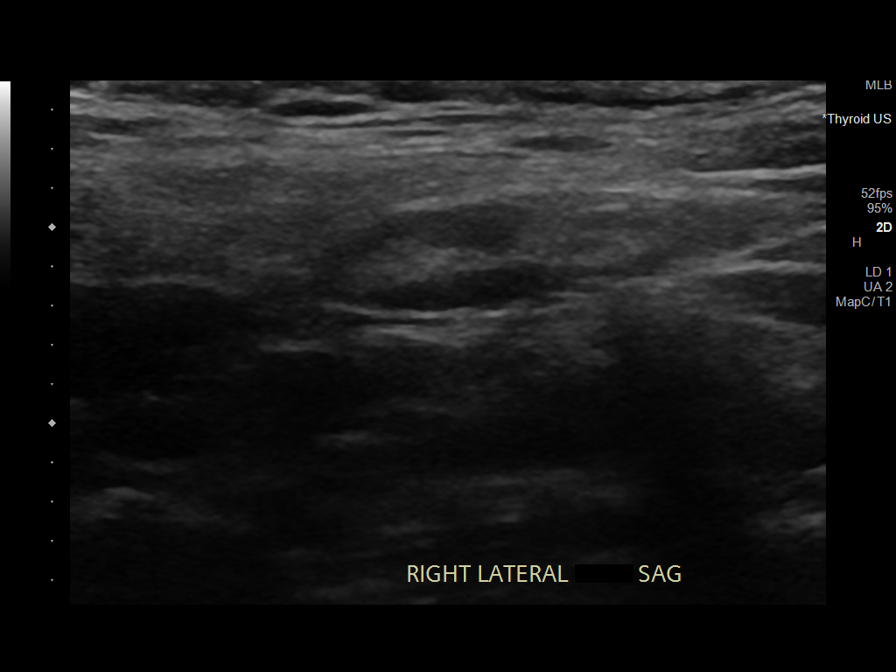
[im 9/9]
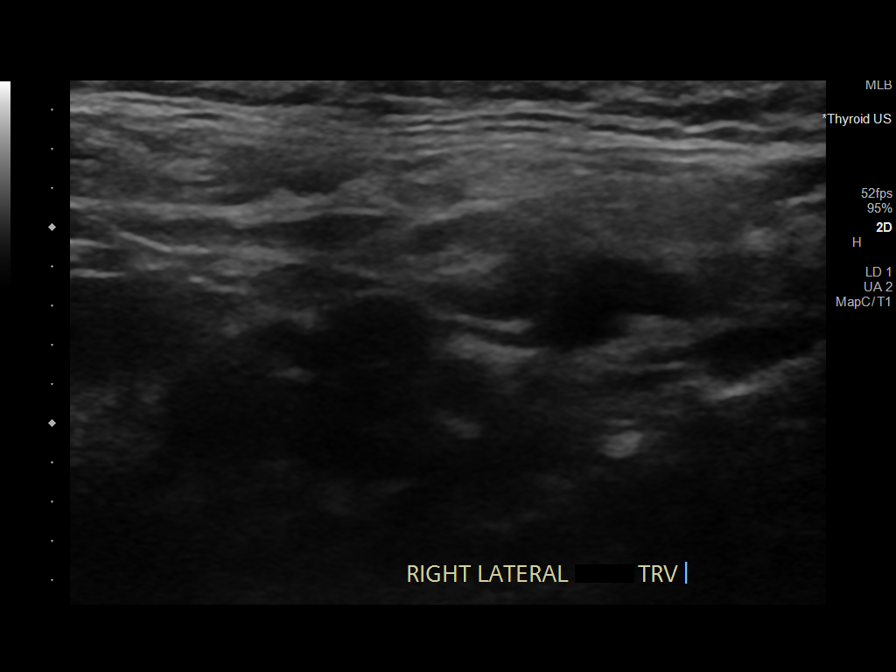

[9 of 9 positions shown; findings below may reference images not displayed]

FINDINGS: Grayscale and color duplex performed in the region of clinical
concern.

No focal fluid. No soft tissue lesion. Typical appearing lymph node
in the region clinical concern.
IMPRESSION: Typical appearing lymph node in the region of clinical concern.

## 2020-09-13 ENCOUNTER — Other Ambulatory Visit: Payer: Self-pay

## 2020-09-13 ENCOUNTER — Encounter: Payer: Self-pay | Admitting: Family Medicine

## 2020-09-13 ENCOUNTER — Ambulatory Visit (INDEPENDENT_AMBULATORY_CARE_PROVIDER_SITE_OTHER): Payer: Medicaid Other | Admitting: Family Medicine

## 2020-09-13 VITALS — BP 110/64 | HR 85 | Wt 163.8 lb

## 2020-09-13 DIAGNOSIS — K429 Umbilical hernia without obstruction or gangrene: Secondary | ICD-10-CM

## 2020-09-13 DIAGNOSIS — R0602 Shortness of breath: Secondary | ICD-10-CM

## 2020-09-13 DIAGNOSIS — M5136 Other intervertebral disc degeneration, lumbar region: Secondary | ICD-10-CM | POA: Diagnosis not present

## 2020-09-13 DIAGNOSIS — Z0000 Encounter for general adult medical examination without abnormal findings: Secondary | ICD-10-CM

## 2020-09-13 DIAGNOSIS — Z8619 Personal history of other infectious and parasitic diseases: Secondary | ICD-10-CM

## 2020-09-13 DIAGNOSIS — Z122 Encounter for screening for malignant neoplasm of respiratory organs: Secondary | ICD-10-CM

## 2020-09-13 DIAGNOSIS — Z720 Tobacco use: Secondary | ICD-10-CM

## 2020-09-13 DIAGNOSIS — E079 Disorder of thyroid, unspecified: Secondary | ICD-10-CM | POA: Diagnosis not present

## 2020-09-13 DIAGNOSIS — Z13220 Encounter for screening for lipoid disorders: Secondary | ICD-10-CM | POA: Diagnosis not present

## 2020-09-13 DIAGNOSIS — H6122 Impacted cerumen, left ear: Secondary | ICD-10-CM

## 2020-09-13 MED ORDER — NICOTINE 14 MG/24HR TD PT24: 14.0000 mg | MEDICATED_PATCH | Freq: Every day | TRANSDERMAL | 1 refills | Status: DC

## 2020-09-13 MED ORDER — DEBROX 6.5 % OT SOLN: 5.0000 [drp] | Freq: Two times a day (BID) | OTIC | 0 refills | Status: DC

## 2020-09-13 NOTE — Assessment & Plan Note (Addendum)
Patient presents today for annual physical examination.  He is up-to-date on colon cancer screening, will need repeat colonoscopy in 3 years due to abnormal findings on prior.  He has not been seen in the clinic in 2 years.  Has not had recent lab work.  Has a dentist for which she has a follow-up scheduled.  Will make appointment for vision testing.  Up-to-date on Covid and flu vaccinations.  Thyroid nodules palpated on examination today.  Patient is a longtime smoker and interested in smoking cessation. PHQ-9 elevated but patient has a history of anxiety and bipolar and is followed by Psychiatrist who manages medications. No SI.  -CMP -TSH -Lipid panel -Nicotine patch 14 mg -Low-dose CT for lung cancer screening

## 2020-09-13 NOTE — Assessment & Plan Note (Signed)
Patient with continued and limiting back pain.  Was previously prescribed gabapentin for this but states he can no longer take this.  He would like a referral to pain management for further management. -Referral to pain management

## 2020-09-13 NOTE — Patient Instructions (Addendum)
It was wonderful to see you today!  Please bring ALL of your medications with you to every visit.   Today we talked about:  We collected blood work to check your cholesterol, your liver and kidney functions and your thyroid.  I will call you to let you know the results of these labs.  I will send referrals for you to pulmonology, surgery, and pain management.  I also ordered a CT scan to assess for lung cancer given your long history of tobacco.  I have sent a prescription for nicotine patch and Debrox drops for your ears.  We will continue to work together to help you stop smoking.   Thank you for choosing [facility].   Please call [phone] with any questions about today's appointment.  Please be sure to schedule follow up at the front  desk before you leave today.   Sabino Dick, DO PGY-1 Family Medicine

## 2020-09-13 NOTE — Assessment & Plan Note (Addendum)
Patient requests referral to surgery for umbilical hernia correction.  Feels that it has been worsening.  Was previously evaluated by the surgeon but did not undergo surgery due to Covid pandemic.  Palpable and reducible umbilical hernia on examination. -Referral to [facility] surgery

## 2020-09-13 NOTE — Progress Notes (Signed)
SUBJECTIVE:   CHIEF COMPLAINT / HPI:   Annual Physical/health maintenance Patient presents for annual physical exam today.  He has some concerns that will be addressed and further problems.  He has received his COVID vaccines and booster, flu vaccine.  He has a history of hepatitis C that was treated.  His colonoscopy is up-to-date, states he had it 2 years ago.  They were some abnormal findings at the time and he was recommended for repeat screening in 5 years (3 years from now).  He is a smoker, smokes 0 to half a pack per day.  He has smoked for over 20 years.  He is interested in nicotine patch to help him stop.  He previously was able to stop smoking for 1 year with nicotine patch.  Denies any other drug use besides THC.  He drinks alcohol a couple times a months, 3 beers typically.  He has a scheduled follow-up with a dentist.  He will make a follow-up for a vision examination.  Umbilical Hernia Patient states that he was previously referred to surgery for correction of his umbilical hernia.  Unfortunately he was unable to go forward with the surgery due to the start of Covid.  He would like a referral back to [facility] [place] for correction of his umbilical hernia.  Feels that it has been getting larger.  Back Pain Patient has a longstanding history of back pain with associated neuropathy.  He was previously started on gabapentin for this pain but can no longer take it.  He would like referral to pain management for further management of his chronic and limiting pain/neuropathy.  He really is interested in surgical correction of his back but states that this has not been presented as an option for him.   Shortness of breath Patient is a longtime smoker.  He thinks at some point he has had pulmonary function testing but states it was several years ago.  He is currently using albuterol inhaler, 2 puffs daily for his shortness of breath.  He did some research and would like to try Trelegy.   He also would like a chest x-ray to look at his lungs.  History of hepatitis C Patient has history of hepatitis C that has been treated.  He would like blood work today to examine his liver function.  Is also interested in assessing his kidney function.  Excessive cerumen Patient would like eardrops for his ear.  States left ear is prone to getting cerumen impaction.  Has been using hydrogen peroxide.  Does not use Q-tips.   PERTINENT  PMH / PSH:  Past Medical History:  Diagnosis Date  . Acid reflux   . Alcohol use disorder, mild, abuse   . Anxiety   . Bipolar disorder (HCC)   . COPD (chronic obstructive pulmonary disease) (HCC)   . Degenerative disc disease, lumbar   . Depression   . History of hepatitis C 2016   treated  . Opioid dependence (HCC)   . Umbilical hernia      OBJECTIVE:   BP 110/64   Pulse 85   Wt 163 lb 12.8 oz (74.3 kg)   SpO2 95%   BMI 23.50 kg/m    General: NAD, pleasant, able to participate in exam, smells of tobacco  Cardiac: RRR, systolic murmur Respiratory: CTAB, normal effort, No wheezes, rales or rhonchi Abdomen: Bowel sounds present, nontender, nondistended, no hepatosplenomegaly. Reducible umbilical hernia present Extremities: no edema or cyanosis. Skin: warm and dry, no  rashes noted Neuro: alert, no obvious focal deficits Psych: Normal affect and mood  Depression screen [facility] 2/9 09/13/2020 02/25/2019 11/30/2018  Decreased Interest 3 0 0  Down, Depressed, Hopeless 2 1 1   PHQ - 2 Score 5 1 1   Altered sleeping 3 - -  Tired, decreased energy 3 - -  Change in appetite 1 - -  Feeling bad or failure about yourself  3 - -  Trouble concentrating 2 - -  Moving slowly or fidgety/restless 2 - -  Suicidal thoughts 0 - -  PHQ-9 Score 19 - -  Difficult doing work/chores Very difficult - -     ASSESSMENT/PLAN:   Healthcare maintenance Patient presents today for annual physical examination.  He is up-to-date on colon cancer screening, will need  repeat colonoscopy in 3 years due to abnormal findings on prior.  He has not been seen in the clinic in 2 years.  Has not had recent lab work.  Has a dentist for which she has a follow-up scheduled.  Will make appointment for vision testing.  Up-to-date on Covid and flu vaccinations.  Thyroid nodules palpated on examination today.  Patient is a longtime smoker and interested in smoking cessation. PHQ-9 elevated but patient has a history of anxiety and bipolar and is followed by Psychiatrist who manages medications. No SI.  -CMP -TSH -Lipid panel -Nicotine patch 14 mg -Low-dose CT for lung cancer screening   Tobacco abuse Patient has been smoking for over 20 years.  Unclear on exact pack year as patient is unable to tell me the amount of time he has been smoking or the exact quantity.  Has recently cut down and states he is smoking 0 to half a pack per day.  Nicotine patch has been sent to pharmacy to help with smoking cessation.  Suspect that his shortness of breath is related to COPD.  Patient does not have any recent PFTs.  Will refer to pulmonology for formal PFTs.  He is currently using albuterol solely for shortness of breath.  Would probably benefit from adjunct therapy but will hold until PFTs are performed. -Referral to pulmonology  Umbilical hernia Patient requests referral to surgery for umbilical hernia correction.  Feels that it has been worsening.  Was previously evaluated by the surgeon but did not undergo surgery due to Covid pandemic.  Palpable and reducible umbilical hernia on examination. -Referral to [facility] surgery  Degenerative disc disease, lumbar Patient with continued and limiting back pain.  Was previously prescribed gabapentin for this but states he can no longer take this.  He would like a referral to pain management for further management. -Referral to pain management  Left ear impacted cerumen Left ear with impacted cerumen, unable to visualize TM.  Patient  has been using hydrogen peroxide drops for this.  Recommend Debrox drops twice daily to left ear.  Can return for ear irrigation if not improving. -Debrox sent to pharmacy      , DO [place]    This note was prepared using Dragon voice recognition software and may include unintentional dictation errors due to the inherent limitations of voice recognition software.

## 2020-09-13 NOTE — Addendum Note (Signed)
Addended by: Sabino Dick on: 09/13/2020 04:29 PM   Modules accepted: Orders

## 2020-09-13 NOTE — Assessment & Plan Note (Signed)
Patient has been smoking for over 20 years.  Unclear on exact pack year as patient is unable to tell me the amount of time he has been smoking or the exact quantity.  Has recently cut down and states he is smoking 0 to half a pack per day.  Nicotine patch has been sent to pharmacy to help with smoking cessation.  Suspect that his shortness of breath is related to COPD.  Patient does not have any recent PFTs.  Will refer to pulmonology for formal PFTs.  He is currently using albuterol solely for shortness of breath.  Would probably benefit from adjunct therapy but will hold until PFTs are performed. -Referral to pulmonology

## 2020-09-13 NOTE — Assessment & Plan Note (Signed)
Left ear with impacted cerumen, unable to visualize TM.  Patient has been using hydrogen peroxide drops for this.  Recommend Debrox drops twice daily to left ear.  Can return for ear irrigation if not improving. -Debrox sent to pharmacy

## 2020-09-14 ENCOUNTER — Other Ambulatory Visit: Payer: Self-pay | Admitting: Family Medicine

## 2020-09-14 DIAGNOSIS — R0602 Shortness of breath: Secondary | ICD-10-CM

## 2020-09-14 DIAGNOSIS — Z720 Tobacco use: Secondary | ICD-10-CM

## 2020-09-14 LAB — CMP AND LIVER
ALT: 21 IU/L (ref 0–44)
AST: 33 IU/L (ref 0–40)
Albumin: 4.5 g/dL (ref 3.8–4.9)
Alkaline Phosphatase: 71 IU/L (ref 44–121)
BUN: 9 mg/dL (ref 6–24)
Bilirubin Total: 0.4 mg/dL (ref 0.0–1.2)
Bilirubin, Direct: 0.13 mg/dL (ref 0.00–0.40)
CO2: 23 mmol/L (ref 20–29)
Calcium: 9.5 mg/dL (ref 8.7–10.2)
Chloride: 102 mmol/L (ref 96–106)
Creatinine, Ser: 0.88 mg/dL (ref 0.76–1.27)
Glucose: 102 mg/dL — ABNORMAL HIGH (ref 65–99)
Potassium: 4.3 mmol/L (ref 3.5–5.2)
Sodium: 138 mmol/L (ref 134–144)
Total Protein: 6.8 g/dL (ref 6.0–8.5)
eGFR: 99 mL/min/{1.73_m2} (ref >59)

## 2020-09-14 LAB — LIPID PANEL
Chol/HDL Ratio: 3.2 ratio (ref 0.0–5.0)
Cholesterol, Total: 200 mg/dL — ABNORMAL HIGH (ref 100–199)
HDL: 62 mg/dL (ref >39)
LDL Chol Calc (NIH): 114 mg/dL — ABNORMAL HIGH (ref 0–99)
Triglycerides: 137 mg/dL (ref 0–149)
VLDL Cholesterol Cal: 24 mg/dL (ref 5–40)

## 2020-09-14 LAB — TSH: TSH: 1.41 u[IU]/mL (ref 0.450–4.500)

## 2020-09-17 ENCOUNTER — Other Ambulatory Visit: Payer: Self-pay | Admitting: Family Medicine

## 2020-09-17 DIAGNOSIS — E785 Hyperlipidemia, unspecified: Secondary | ICD-10-CM

## 2020-09-17 MED ORDER — ATORVASTATIN CALCIUM 20 MG PO TABS: 20.0000 mg | ORAL_TABLET | Freq: Every day | ORAL | 3 refills | Status: DC

## 2020-09-20 ENCOUNTER — Other Ambulatory Visit: Payer: Self-pay

## 2020-09-20 MED ORDER — FLOVENT HFA 44 MCG/ACT IN AERO: 2.0000 | INHALATION_SPRAY | Freq: Two times a day (BID) | RESPIRATORY_TRACT | 12 refills | Status: DC

## 2020-10-02 ENCOUNTER — Ambulatory Visit
Admission: RE | Admit: 2020-10-02 | Discharge: 2020-10-02 | Disposition: A | Discharge status: Home / Self Care | Payer: Medicaid Other | Source: Ambulatory Visit | Attending: Family Medicine | Admitting: Family Medicine

## 2020-10-02 ENCOUNTER — Encounter: Payer: Self-pay | Admitting: Physical Medicine and Rehabilitation

## 2020-10-02 DIAGNOSIS — E079 Disorder of thyroid, unspecified: Secondary | ICD-10-CM

## 2020-10-02 DIAGNOSIS — Z720 Tobacco use: Secondary | ICD-10-CM

## 2020-10-02 DIAGNOSIS — R0602 Shortness of breath: Secondary | ICD-10-CM

## 2020-10-05 ENCOUNTER — Telehealth: Payer: Self-pay

## 2020-10-05 NOTE — Telephone Encounter (Signed)
Patient calls nurse line requesting to speak with Dr. Melba Coon regarding results of CXR and [place].   Please advise.   Veronda Prude, RN

## 2020-10-08 ENCOUNTER — Other Ambulatory Visit: Payer: Self-pay | Admitting: Family Medicine

## 2020-10-08 NOTE — Telephone Encounter (Signed)
Called patient this morning to discuss findings of Thyroid U/S and CT chest.

## 2020-11-21 ENCOUNTER — Encounter: Payer: Medicaid Other | Admitting: Physical Medicine and Rehabilitation

## 2020-11-29 ENCOUNTER — Other Ambulatory Visit: Payer: Self-pay | Admitting: Family Medicine

## 2020-11-29 ENCOUNTER — Telehealth: Payer: Self-pay

## 2020-11-29 DIAGNOSIS — J45909 Unspecified asthma, uncomplicated: Secondary | ICD-10-CM

## 2020-11-29 MED ORDER — FLUTICASONE PROPIONATE 50 MCG/ACT NA SUSP: 2.0000 | Freq: Every day | NASAL | 6 refills | Status: DC

## 2020-11-29 MED ORDER — LORATADINE 10 MG PO TABS
10.0000 mg | ORAL_TABLET | Freq: Every day | ORAL | 11 refills | Status: DC
Start: 2020-11-29 — End: 2021-11-06

## 2020-11-29 NOTE — Telephone Encounter (Signed)
Patient calls nurse line requesting antibiotic for possible sinus infection. Patient reports that he has been having "burning sensation" in nasal passages, watery eyes, increased sneezing and pressure in sinuses.   Patient reports that he has tried OTC Sudafed with little relief.   Advised patient that he would likely need an appointment for antibiotic. Patient declines appointment, as he has difficulties with transportation.   Patient would like rx sent for claritin and flonase to see if these medications would help with symptoms.   Please advise.   Veronda Prude, RN

## 2020-11-30 NOTE — Telephone Encounter (Signed)
Patient returned call to nurse line after one administration of flonase and informed that he is not feeling any better.   Advised patient that he should continue for a few days, as to give medication time to work.   Patient continues to request antibiotic. Reinforced that appointment would be needed for antibiotic. Patient states again that he has issues with transportation and needs three days notice for transportation. Could patient have virtual appointment?   Please advise.   Veronda Prude, RN

## 2020-12-24 ENCOUNTER — Other Ambulatory Visit: Payer: Self-pay | Admitting: Family Medicine

## 2021-01-03 ENCOUNTER — Institutional Professional Consult (permissible substitution): Payer: Medicaid Other | Admitting: Pulmonary Disease

## 2021-02-01 ENCOUNTER — Institutional Professional Consult (permissible substitution): Payer: Medicaid Other | Admitting: Internal Medicine

## 2021-02-01 NOTE — Progress Notes (Deleted)
   Carlos Montoya, male    DOB: 1961/01/19,    MRN: [id number]   Brief patient profile:  60 yo ***   referred to pulmonary clinic 02/01/2021 by Dr   Terisa Starr for sob.     History of Present Illness  02/01/2021  Pulmonary/ 1st office eval/Carlos Montoya  No chief complaint on file.    Dyspnea:  *** Cough: *** Sleep: *** SABA use:   Past Medical History:  Diagnosis Date   Acid reflux    Alcohol use disorder, mild, abuse    Anxiety    Bipolar disorder (HCC)    COPD (chronic obstructive pulmonary disease) (HCC)    Degenerative disc disease, lumbar    Depression    History of hepatitis C 2016   treated   Opioid dependence (HCC)    Umbilical hernia     Outpatient Medications Prior to Visit  Medication Sig Dispense Refill   atorvastatin (LIPITOR) 20 MG tablet Take 1 tablet (20 mg total) by mouth daily. 90 tablet 3   carbamide peroxide (DEBROX) 6.5 % OTIC solution Place 5 drops into the left ear 2 (two) times daily. 15 mL 0   diclofenac (VOLTAREN) 75 MG EC tablet Take 1 tablet (75 mg total) by mouth 2 (two) times daily. (Patient not taking: Reported on 09/13/2020) 30 tablet 0   FLUoxetine (PROZAC) 10 MG tablet Take 10 mg by mouth daily.     fluticasone (FLONASE) 50 MCG/ACT nasal spray Place 2 sprays into both nostrils daily. 16 g 6   fluticasone (FLOVENT HFA) 44 MCG/ACT inhaler Inhale 2 puffs into the lungs 2 (two) times daily. 1 each 12   gabapentin (NEURONTIN) 600 MG tablet TAKE 1 TABLET BY MOUTH THREE TIMES A DAY (Patient not taking: Reported on 09/13/2020) 60 tablet 2   ibuprofen (ADVIL) 600 MG tablet TAKE 1 TABLET BY MOUTH EVERY 6 HOURS AS NEEDED 90 tablet 3   loratadine (CLARITIN) 10 MG tablet Take 1 tablet (10 mg total) by mouth daily. 30 tablet 11   nicotine (NICODERM CQ - DOSED IN MG/24 HOURS) 14 mg/24hr patch Place 1 patch (14 mg total) onto the skin daily. 28 patch 1   OLANZapine (ZYPREXA) 10 MG tablet Take 10 mg by mouth at bedtime.     omeprazole (PRILOSEC) 20 MG  capsule TAKE 1 CAPSULE BY MOUTH EVERY DAY 90 capsule 1   PROAIR HFA 108 (90 Base) MCG/ACT inhaler INHALE 2 PUFFS BY MOUTH EVERY 6 HOURS AS NEEDED 8.5 each 0   risperiDONE (RISPERDAL) 1 MG tablet Take 1 mg by mouth at bedtime. (Patient not taking: Reported on 09/13/2020)     No facility-administered medications prior to visit.     Objective:     There were no vitals taken for this visit.         Assessment   No problem-specific Assessment & Plan notes found for this encounter.     Carlos Hughs, MD 02/01/2021

## 2021-02-03 ENCOUNTER — Other Ambulatory Visit: Payer: Self-pay | Admitting: Family Medicine

## 2021-04-01 ENCOUNTER — Encounter (HOSPITAL_COMMUNITY): Payer: Self-pay

## 2021-04-01 ENCOUNTER — Emergency Department (HOSPITAL_COMMUNITY): Payer: Medicaid Other

## 2021-04-01 ENCOUNTER — Emergency Department (HOSPITAL_COMMUNITY)
Admission: EM | Admit: 2021-04-01 | Discharge: 2021-04-01 | Disposition: A | Discharge status: Home / Self Care | Payer: Medicaid Other | Attending: Emergency Medicine | Admitting: Emergency Medicine

## 2021-04-01 ENCOUNTER — Encounter
Payer: Medicaid Other | Attending: Physical Medicine and Rehabilitation | Admitting: Physical Medicine and Rehabilitation

## 2021-04-01 ENCOUNTER — Other Ambulatory Visit: Payer: Self-pay

## 2021-04-01 DIAGNOSIS — Y92090 Kitchen in other non-institutional residence as the place of occurrence of the external cause: Secondary | ICD-10-CM | POA: Insufficient documentation

## 2021-04-01 DIAGNOSIS — R42 Dizziness and giddiness: Secondary | ICD-10-CM | POA: Insufficient documentation

## 2021-04-01 DIAGNOSIS — Z23 Encounter for immunization: Secondary | ICD-10-CM | POA: Insufficient documentation

## 2021-04-01 DIAGNOSIS — Y93G3 Activity, cooking and baking: Secondary | ICD-10-CM | POA: Insufficient documentation

## 2021-04-01 DIAGNOSIS — S6992XA Unspecified injury of left wrist, hand and finger(s), initial encounter: Secondary | ICD-10-CM | POA: Diagnosis present

## 2021-04-01 DIAGNOSIS — W268XXA Contact with other sharp object(s), not elsewhere classified, initial encounter: Secondary | ICD-10-CM | POA: Diagnosis not present

## 2021-04-01 DIAGNOSIS — F17210 Nicotine dependence, cigarettes, uncomplicated: Secondary | ICD-10-CM | POA: Diagnosis not present

## 2021-04-01 DIAGNOSIS — J45909 Unspecified asthma, uncomplicated: Secondary | ICD-10-CM | POA: Insufficient documentation

## 2021-04-01 DIAGNOSIS — Z79899 Other long term (current) drug therapy: Secondary | ICD-10-CM | POA: Insufficient documentation

## 2021-04-01 DIAGNOSIS — S61211A Laceration without foreign body of left index finger without damage to nail, initial encounter: Secondary | ICD-10-CM | POA: Diagnosis not present

## 2021-04-01 DIAGNOSIS — J449 Chronic obstructive pulmonary disease, unspecified: Secondary | ICD-10-CM | POA: Diagnosis not present

## 2021-04-01 LAB — CBC WITH DIFFERENTIAL/PLATELET
Abs Immature Granulocytes: 0.01 10*3/uL (ref 0.00–0.07)
Basophils Absolute: 0 10*3/uL (ref 0.0–0.1)
Basophils Relative: 1 %
Eosinophils Absolute: 0.1 10*3/uL (ref 0.0–0.5)
Eosinophils Relative: 1 %
HCT: 42.9 % (ref 39.0–52.0)
Hemoglobin: 15.1 g/dL (ref 13.0–17.0)
Immature Granulocytes: 0 %
Lymphocytes Relative: 40 %
Lymphs Abs: 2.7 10*3/uL (ref 0.7–4.0)
MCH: 33.9 pg (ref 26.0–34.0)
MCHC: 35.2 g/dL (ref 30.0–36.0)
MCV: 96.4 fL (ref 80.0–100.0)
Monocytes Absolute: 0.5 10*3/uL (ref 0.1–1.0)
Monocytes Relative: 8 %
Neutro Abs: 3.5 10*3/uL (ref 1.7–7.7)
Neutrophils Relative %: 50 %
Platelets: 274 10*3/uL (ref 150–400)
RBC: 4.45 MIL/uL (ref 4.22–5.81)
RDW: 12 % (ref 11.5–15.5)
WBC: 6.8 10*3/uL (ref 4.0–10.5)
nRBC: 0 % (ref 0.0–0.2)

## 2021-04-01 LAB — COMPREHENSIVE METABOLIC PANEL
ALT: 37 U/L (ref 0–44)
AST: 36 U/L (ref 15–41)
Albumin: 4.6 g/dL (ref 3.5–5.0)
Alkaline Phosphatase: 62 U/L (ref 38–126)
Anion gap: 10 (ref 5–15)
BUN: 10 mg/dL (ref 6–20)
CO2: 30 mmol/L (ref 22–32)
Calcium: 9.5 mg/dL (ref 8.9–10.3)
Chloride: 106 mmol/L (ref 98–111)
Creatinine, Ser: 0.85 mg/dL (ref 0.61–1.24)
GFR, Estimated: >60 mL/min (ref >60)
Glucose, Bld: 101 mg/dL — ABNORMAL HIGH (ref 70–99)
Potassium: 3.9 mmol/L (ref 3.5–5.1)
Sodium: 146 mmol/L — ABNORMAL HIGH (ref 135–145)
Total Bilirubin: 0.5 mg/dL (ref 0.3–1.2)
Total Protein: 8.1 g/dL (ref 6.5–8.1)

## 2021-04-01 MED ORDER — TETANUS-DIPHTH-ACELL PERTUSSIS 5-2.5-18.5 LF-MCG/0.5 IM SUSY
0.5000 mL | PREFILLED_SYRINGE | Freq: Once | INTRAMUSCULAR | Status: AC
  Administered 2021-04-01: 0.5 mL via INTRAMUSCULAR
  Filled 2021-04-01: qty 0.5

## 2021-04-01 MED ORDER — ACETAMINOPHEN 500 MG PO TABS
1000.0000 mg | ORAL_TABLET | Freq: Once | ORAL | Status: AC
  Administered 2021-04-01: 1000 mg via ORAL
  Filled 2021-04-01: qty 2

## 2021-04-01 NOTE — ED Notes (Signed)
UNable to obtain signature at time of discharge as pt is in hall bed.

## 2021-04-01 NOTE — ED Notes (Signed)
Pt is loud and verbally abusing staff because his finger hurts and he has not yet been given pain medication. RN attempted to explain that we cannot medicate without orders from a provider but he is still upset and yelling in the hallway. He demands that we call him a cab and call a different hospital to come pick him up. RN explained that he will have to leave AMA and find his own way to a different facility if that is his choice. He then agreed to wait to be seen for a provider.

## 2021-04-01 NOTE — Discharge Instructions (Addendum)
Return for any problem.  ?

## 2021-04-01 NOTE — ED Triage Notes (Addendum)
Pt BIB EMS, pt cut his left index finger cooking last night while cooking. Pt super glued it last night. Pt states that he fell today, does not know if he had LOC.   142/84 bp 96 O2 room air  127 cbg 97.9 F 18 R

## 2021-04-01 NOTE — ED Provider Notes (Signed)
Emergency Medicine Provider Triage Evaluation Note  Carlos Montoya , a 60 y.o. male  was evaluated in triage.  Pt complains of finger lac while cooking last night.  Put superglue on the area, today is concerned it is infected. Tdap is not UTD Just prior to arrival, patient became dizzy and fell to the ground.  He thinks he might of passed out.  He reports diffuse back pain since then, has a history of chronic back pain.  He is not on blood thinners.  Review of Systems  Positive: Finger lac, dizziness, back pain Negative: numbness  Physical Exam  There were no vitals taken for this visit. Gen:   Awake, no distress   Resp:  Normal effort  MSK:   1.5 cm laceration of the left index finger between the DIP and PIP.  Surrounding erythema.  Good distal sensation and cap refill.  Diffuse tenderness palpation of the entire back without focal pain over midline spine.   Medical Decision Making  Medically screening exam initiated at 7:16 PM.  Appropriate orders placed.  Georgette Shell Porcaro was informed that the remainder of the evaluation will be completed by another provider, this initial triage assessment does not replace that evaluation, and the importance of remaining in the ED until their evaluation is complete.  Labs, UA, ekg, finger xray, tdap   Alveria Apley, PA-C 04/01/21 1923    Wynetta Fines, MD 04/01/21 [phone]

## 2021-04-01 NOTE — ED Provider Notes (Signed)
[facility]-EMERGENCY DEPT Provider Note   CSN: [id number] Arrival date & time: 04/01/21  1911     History Chief Complaint  Patient presents with   Laceration    Carlos Montoya is a 60 y.o. male.  60 year old male with prior medical history as detailed below presents for evaluation.  Patient reports that he cut his left index finger yesterday while cooking.  He cleaned the wound with hydrogen peroxide.  He then approximated the wound edges and applied superglue at home.  He presents now complaining of pain to the entire left finger.  He is loudly demanding pain medication.  He asked this provider to "apply acetone and remove the glue" so that we can then sew up the injury.  Patient reports that he was on the way to the ED for evaluation when he had an episode of feeling dizzy and lightheaded.  He denies actually passing out.  He denies associated chest pain or shortness of breath.  He denies to be comfortable and competent.  He is sitting in the hallway loudly demanding that staff provide pain medication.    The history is provided by the patient.  Laceration Location:  Finger Finger laceration location:  L index finger Length:  1.5 Pain details:    Quality:  Aching   Severity:  Mild   Timing:  Constant     Past Medical History:  Diagnosis Date   Acid reflux    Alcohol use disorder, mild, abuse    Anxiety    Bipolar disorder (HCC)    COPD (chronic obstructive pulmonary disease) (HCC)    Degenerative disc disease, lumbar    Depression    History of hepatitis C 2016   treated   Opioid dependence (HCC)    Umbilical hernia     Patient Active Problem List   Diagnosis Date Noted   Asthma 03/03/2019   Foot pain 03/03/2019   Lymphadenopathy 03/03/2019   Weight loss 11/30/2018   Chronic left shoulder pain 04/26/2018   Encounter for smoking cessation counseling 04/26/2018   Seasonal allergies 12/18/2017   Healthcare maintenance 09/10/2017    Chronic pain syndrome 09/10/2017   Left ear impacted cerumen 09/10/2017   Moderate episode of recurrent major depressive disorder (HCC) 09/10/2017   Alcohol use disorder, severe, dependence (HCC) 07/13/2017   GERD (gastroesophageal reflux disease) 03/11/2017   Umbilical hernia 03/11/2017   Degenerative disc disease, lumbar 07/17/2014   Tobacco abuse 07/17/2014    Past Surgical History:  Procedure Laterality Date   TONSILLECTOMY         Family History  Problem Relation Age of Onset   Diabetes Mother    Heart disease Father    Colon cancer Father     Social History   Tobacco Use   Smoking status: Every Day    Packs/day: 0.50    Types: Cigarettes   Smokeless tobacco: Never  Substance Use Topics   Alcohol use: Yes    Comment: drinks beer   Drug use: No    Home Medications Prior to Admission medications   Medication Sig Start Date End Date Taking? Authorizing Provider  atorvastatin (LIPITOR) 20 MG tablet Take 1 tablet (20 mg total) by mouth daily. 09/17/20   Sabino Dick, DO  carbamide peroxide (DEBROX) 6.5 % OTIC solution Place 5 drops into the left ear 2 (two) times daily. 09/13/20   Sabino Dick, DO  diclofenac (VOLTAREN) 75 MG EC tablet Take 1 tablet (75 mg total) by mouth 2 (two)  times daily. Patient not taking: Reported on 09/13/2020 04/19/18   Myrene Buddy, MD  FLUoxetine (PROZAC) 10 MG tablet Take 10 mg by mouth daily.    [provider]  fluticasone (FLONASE) 50 MCG/ACT nasal spray Place 2 sprays into both nostrils daily. 11/29/20   Sabino Dick, DO  fluticasone (FLOVENT HFA) 44 MCG/ACT inhaler Inhale 2 puffs into the lungs 2 (two) times daily. 09/20/20   Sabino Dick, DO  gabapentin (NEURONTIN) 600 MG tablet TAKE 1 TABLET BY MOUTH THREE TIMES A DAY Patient not taking: Reported on 09/13/2020 01/04/20   Myrene Buddy, MD  ibuprofen (ADVIL) 600 MG tablet TAKE 1 TABLET BY MOUTH EVERY 6 HOURS AS NEEDED 07/11/19   Myrene Buddy, MD   loratadine (CLARITIN) 10 MG tablet Take 1 tablet (10 mg total) by mouth daily. 11/29/20   Sabino Dick, DO  nicotine (NICODERM CQ - DOSED IN MG/24 HOURS) 14 mg/24hr patch Place 1 patch (14 mg total) onto the skin daily. 09/13/20   Sabino Dick, DO  OLANZapine (ZYPREXA) 10 MG tablet Take 10 mg by mouth at bedtime.    [provider]  omeprazole (PRILOSEC) 20 MG capsule TAKE 1 CAPSULE BY MOUTH EVERY DAY 02/04/21   Sabino Dick, DO  PROAIR HFA 108 (90 Base) MCG/ACT inhaler INHALE 2 PUFFS BY MOUTH EVERY 6 HOURS AS NEEDED 02/04/21   Sabino Dick, DO  risperiDONE (RISPERDAL) 1 MG tablet Take 1 mg by mouth at bedtime. Patient not taking: Reported on 09/13/2020    [provider]    Allergies    Prednisone and Diphenhydramine hcl  Review of Systems   Review of Systems  All other systems reviewed and are negative.  Physical Exam Updated Vital Signs BP 121/90 (BP Location: Right Arm)   Pulse 92   Temp 98 F (36.7 C) (Oral)   Resp 20   SpO2 97%   Physical Exam Vitals and nursing note reviewed.  Constitutional:      General: He is not in acute distress.    Appearance: Normal appearance. He is well-developed.  HENT:     Head: Normocephalic and atraumatic.  Eyes:     Conjunctiva/sclera: Conjunctivae normal.     Pupils: Pupils are equal, round, and reactive to light.  Cardiovascular:     Rate and Rhythm: Normal rate and regular rhythm.     Heart sounds: Normal heart sounds.  Pulmonary:     Effort: Pulmonary effort is normal. No respiratory distress.     Breath sounds: Normal breath sounds.  Abdominal:     General: There is no distension.     Palpations: Abdomen is soft.     Tenderness: There is no abdominal tenderness.  Musculoskeletal:        General: No deformity. Normal range of motion.     Cervical back: Normal range of motion and neck supple.  Skin:    General: Skin is warm and dry.     Comments: 1.5 cm laceration to the left index  finger.  Wound appears to be healing well.  There is no signs of infection.  There is no erythema or drainage.  Wound margins are fairly well approximated with the patient's self applied Superglue in place.  Patient with full active range of motion of his left index finger.  He does not appear to have any motor or sensory defect in the left index finger or left hand.    Neurological:     General: No focal deficit present.  Mental Status: He is alert and oriented to person, place, and time.    ED Results / Procedures / Treatments   Labs (all labs ordered are listed, but only abnormal results are displayed) Labs Reviewed  CBC WITH DIFFERENTIAL/PLATELET  COMPREHENSIVE METABOLIC PANEL  URINALYSIS, ROUTINE W REFLEX MICROSCOPIC    EKG EKG Interpretation  Date/Time:  Monday April 01 2021 20:29:20 EDT Ventricular Rate:  75 PR Interval:  130 QRS Duration: 152 QT Interval:  432 QTC Calculation: 482 R Axis:   87 Text Interpretation: Normal sinus rhythm Right bundle branch block Abnormal ECG Confirmed by Kristine Royal [phone]) on 04/01/2021 8:35:55 PM  Radiology DG Chest 2 View  Result Date: 04/01/2021 CLINICAL DATA:  Fall EXAM: CHEST - 2 VIEW COMPARISON:  10/27/2018 FINDINGS: The heart size and mediastinal contours are within normal limits. Both lungs are clear. The visualized skeletal structures are unremarkable. IMPRESSION: No active cardiopulmonary disease. Electronically Signed   By: Jasmine Pang M.D.   On: 04/01/2021 20:15   CT HEAD WO CONTRAST ( )  Result Date: 04/01/2021 CLINICAL DATA:  Head trauma dizzy EXAM: CT HEAD WITHOUT CONTRAST CT CERVICAL SPINE WITHOUT CONTRAST TECHNIQUE: Multidetector CT imaging of the head and cervical spine was performed following the standard protocol without intravenous contrast. Multiplanar CT image reconstructions of the cervical spine were also generated. COMPARISON:  CT brain 09/10/2017 FINDINGS: CT HEAD FINDINGS Brain: No acute  territorial infarction, hemorrhage or intracranial mass. Mild atrophy. Nonenlarged ventricles Vascular: No hyperdense vessel.  Carotid vascular calcification Skull: Normal. Negative for fracture or focal lesion. Sinuses/Orbits: Chronic fractures medial walls of both orbits. Other: None CT CERVICAL SPINE FINDINGS Alignment: Straightening of the cervical spine. No subluxation. Facet alignment within normal limits. Skull base and vertebrae: No acute fracture. No primary bone lesion or focal pathologic process. Soft tissues and spinal canal: No prevertebral fluid or swelling. No visible canal hematoma. Disc levels:  No significant disc space narrowing or widening. Upper chest: Negative.  Mild apical emphysema Other: None IMPRESSION: 1. No CT evidence for acute intracranial abnormality.  Mild atrophy. 2. Straightening of the cervical spine. No acute osseous abnormality Electronically Signed   By: Jasmine Pang M.D.   On: 04/01/2021 20:10   CT Cervical Spine Wo Contrast  Result Date: 04/01/2021 CLINICAL DATA:  Head trauma dizzy EXAM: CT HEAD WITHOUT CONTRAST CT CERVICAL SPINE WITHOUT CONTRAST TECHNIQUE: Multidetector CT imaging of the head and cervical spine was performed following the standard protocol without intravenous contrast. Multiplanar CT image reconstructions of the cervical spine were also generated. COMPARISON:  CT brain 09/10/2017 FINDINGS: CT HEAD FINDINGS Brain: No acute territorial infarction, hemorrhage or intracranial mass. Mild atrophy. Nonenlarged ventricles Vascular: No hyperdense vessel. Carotid vascular calcification Skull: Normal. Negative for fracture or focal lesion. Sinuses/Orbits: Chronic fractures medial walls of both orbits. Other: None CT CERVICAL SPINE FINDINGS Alignment: Straightening of the cervical spine. No subluxation. Facet alignment within normal limits. Skull base and vertebrae: No acute fracture. No primary bone lesion or focal pathologic process. Soft tissues and spinal canal:  No prevertebral fluid or swelling. No visible canal hematoma. Disc levels:  No significant disc space narrowing or widening. Upper chest: Negative. Mild apical emphysema Other: None IMPRESSION: 1. No CT evidence for acute intracranial abnormality. Mild atrophy. 2. Straightening of the cervical spine. No acute osseous abnormality Electronically Signed   By: Jasmine Pang M.D.   On: 04/01/2021 20:10   DG Finger Index Left  Result Date: 04/01/2021 CLINICAL DATA:  Laceration EXAM: LEFT INDEX FINGER  2+V COMPARISON:  None. FINDINGS: There is no evidence of fracture or dislocation. There is no evidence of arthropathy or other focal bone abnormality. Soft tissues are unremarkable. IMPRESSION: Negative. Electronically Signed   By: Jasmine Pang M.D.   On: 04/01/2021 20:12    Procedures Procedures   Medications Ordered in ED Medications  Tdap (BOOSTRIX) injection 0.5 mL (has no administration in time range)  acetaminophen (TYLENOL) tablet 1,000 mg (has no administration in time range)    ED Course  I have reviewed the triage vital signs and the nursing notes.  Pertinent labs & imaging results that were available during my care of the patient were reviewed by me and considered in my medical decision making (see chart for details).    MDM Rules/Calculators/A&P                           MDM  MSE complete  Carlos Montoya was evaluated in Emergency Department on 04/01/2021 for the symptoms described in the history of present illness. He was evaluated in the context of the global COVID-19 pandemic, which necessitated consideration that the patient might be at risk for infection with the SARS-CoV-2 virus that causes COVID-19. Institutional protocols and algorithms that pertain to the evaluation of patients at risk for COVID-19 are in a state of rapid change based on information released by regulatory bodies including the CDC and federal and state organizations. These policies and algorithms were  followed during the patient's care in the ED.  Patient is presenting with apparent accidental left index finger laceration.  He repaired his own laceration yesterday with application of superglue at home.  Today he is presenting and is loudly demanding narcotic pain medication for his finger laceration.  He reported dizziness and near syncope to triage staff.  Imaging and labs were ordered.  At the time of my evaluation the patient is offered nonnarcotic pain medication for his healing finger laceration.  He is advised to remain in the ED so that we can obtain the lab work that has not yet been drawn.  Patient has capacity to decline to refuse care.  He is advised that his episode of dizziness deserves a workup.  2120 Screening labs/imaging obtained are without significant abnormality.  Patient appears to be comfortable.  He understands need for close follow-up.  He understands basics of wound management.   Final Clinical Impression(s) / ED Diagnoses Final diagnoses:  Laceration of left index finger without foreign body without damage to nail, initial encounter    Rx / DC Orders ED Discharge Orders     None        Wynetta Fines, MD 04/01/21 2123

## 2021-07-16 NOTE — Patient Instructions (Incomplete)
It was wonderful to see you today. ? ?Please bring ALL of your medications with you to every visit.  ? ?Today we talked about: ? ?** ? ? ?Thank you for choosing [facility].  ? ?Please call [phone] with any questions about today's appointment. ? ?Please be sure to schedule follow up at the front  desk before you leave today.  ? ?Mialynn Shelvin, DO ?PGY-2 Family Medicine   ?

## 2021-07-16 NOTE — Progress Notes (Deleted)
° ° °  SUBJECTIVE:   CHIEF COMPLAINT / HPI:   Eye Concern  Carlos Montoya is a 61 y.o. male who presents to the [facility] clinic to discuss ***.    Chronic Back Pain  Referral to pain management sent at last office visit 09/2020. Several attempts were made to contact the patient but were unsuccessful and referral was closed.     PERTINENT  PMH / PSH:  Past Medical History:  Diagnosis Date   Acid reflux    Alcohol use disorder, mild, abuse    Anxiety    Bipolar disorder (HCC)    COPD (chronic obstructive pulmonary disease) (HCC)    Degenerative disc disease, lumbar    Depression    History of hepatitis C 2016   treated   Opioid dependence (HCC)    Umbilical hernia      OBJECTIVE:   There were no vitals taken for this visit.   General: NAD, pleasant, able to participate in exam Cardiac: RRR, no murmurs. Respiratory: CTAB, normal effort, No wheezes, rales or rhonchi Abdomen: Bowel sounds present, nontender, nondistended, no hepatosplenomegaly. Extremities: no edema or cyanosis. Skin: warm and dry, no rashes noted Neuro: alert, no obvious focal deficits Psych: Normal affect and mood  ASSESSMENT/PLAN:   No problem-specific Assessment & Plan notes found for this encounter.     Sabino Dick, DO [facility]

## 2021-07-17 ENCOUNTER — Ambulatory Visit: Payer: Medicaid Other

## 2021-09-28 ENCOUNTER — Other Ambulatory Visit: Payer: Self-pay | Admitting: Family Medicine

## 2021-11-06 ENCOUNTER — Other Ambulatory Visit: Payer: Self-pay

## 2021-11-06 ENCOUNTER — Encounter: Payer: Medicaid Other | Admitting: Family Medicine

## 2021-11-06 ENCOUNTER — Encounter: Payer: Self-pay | Admitting: Family Medicine

## 2021-11-06 ENCOUNTER — Ambulatory Visit: Payer: Medicaid Other | Admitting: Family Medicine

## 2021-11-06 VITALS — BP 126/79 | HR 85 | Wt 154.0 lb

## 2021-11-06 DIAGNOSIS — Z1211 Encounter for screening for malignant neoplasm of colon: Secondary | ICD-10-CM | POA: Diagnosis not present

## 2021-11-06 DIAGNOSIS — H6122 Impacted cerumen, left ear: Secondary | ICD-10-CM | POA: Diagnosis not present

## 2021-11-06 DIAGNOSIS — Z0000 Encounter for general adult medical examination without abnormal findings: Secondary | ICD-10-CM | POA: Diagnosis not present

## 2021-11-06 DIAGNOSIS — J302 Other seasonal allergic rhinitis: Secondary | ICD-10-CM | POA: Diagnosis not present

## 2021-11-06 MED ORDER — CETIRIZINE HCL 10 MG PO TABS: 10.0000 mg | ORAL_TABLET | Freq: Every day | ORAL | 0 refills | Status: DC

## 2021-11-06 MED ORDER — ALBUTEROL SULFATE HFA 108 (90 BASE) MCG/ACT IN AERS: 2.0000 | INHALATION_SPRAY | Freq: Four times a day (QID) | RESPIRATORY_TRACT | 0 refills | Status: DC | PRN

## 2021-11-06 NOTE — Progress Notes (Incomplete)
? ? ?  SUBJECTIVE:  ? ?Chief compliant/HPI: annual examination ? ?Carlos Montoya is a 61 y.o. who presents today for an annual exam.  ? ?R hip pain ?Bilat ear pain ?Eyes watering, burning; itching; claritin not working ? ?Breathing bad --> wondering about trullegy ? ? 1/2 pack twice a week --> tried to quit before with patch, gum, didn't work; not sure about interest in meds ? ?Alcohol: 4 beers twice a week ? ?Walk sometimes (back and hip pain makes it difficult) --> even stationary bike hurts ?Hip pain 6 months, worsening (r worse), sometimes left; stabbing pain when trying to get up; no radiation; better with rest for a few minutes ? ?Back pain --> degenerative disc disease, prior pain meds not working (vycoden 5 mg tid, gabapentin, 600 mg ibuprofen (sometimes helps)) ? ?Falls multiple times with hip and back pain; no major injuries; fall bc of pain, not really bc of unsteadiness; one fall in 2023 so far ? ?Liver cancer - biological father ?Breast cancer - maternal aunt ? ?Seeing a new psychologist soon (fired prior psychologist); no SI ("waiting to die") ? ?Out of Flovent; intested in switching to trulegy ? ?Has SOB, esp walking up stairs; some cp with walking up stairs, going on for a while ? ?Not using nicotine patches ? ?Not on olanzapine (didn't work); out of proair  ? ?Getting 2 hr sleep a night; nightmares ? ?***need labs ? ?History tabs reviewed and updated ***.  ? ?Review of systems form reviewed and notable for ***.  ? ?OBJECTIVE:  ? ?BP 126/79   Pulse 85   Wt 154 lb (69.9 kg)   SpO2 97%   BMI 22.10 kg/m?   ?*** ? ?ASSESSMENT/PLAN:  ? ?No problem-specific Assessment & Plan notes found for this encounter. ?  ? ?Annual Examination  ?See AVS for age appropriate recommendations.  ?PHQ score ***, reviewed and discussed.  ?Blood pressure value is *** goal, discussed.  ? ?Considered the following screening exams based upon USPSTF recommendations: ?Diabetes screening:  {discussed/ordered:14545} ?Screening for elevated cholesterol: {discussed/ordered:14545} ?HIV testing: {discussed/ordered:14545} ?Hepatitis C: {discussed/ordered:14545} ?Hepatitis B: {discussed/ordered:14545} ?Syphilis if at high risk: {discussed/ordered:14545} ?Reviewed risk factors for latent tuberculosis and {not indicated/requested/declined:14582} ?Colorectal cancer screening: {crcscreen:23821::"discussed, colonoscopy ordered"} ?Lung cancer screening: {discussed/declined/written JQZE:09233} See documentation below regarding discussion and indication.  ?PSA discussed and after engaging in discussion of possible risks, benefits and complications of screening patient elected to ***.  ? ?Follow up in 1 year or sooner if indicated.  ? ? ?Governor Rooks, Medical Student ?[facility]   ?

## 2021-11-06 NOTE — Patient Instructions (Addendum)
It was great seeing you today! ? ?You were seen for your annual physical exam.   ? ?You expressed interest in getting some labs done so we can check your PSA, check your liver enzymes and I will also check your cholesterol ? ?I am referring you to our pharmacy team to discuss smoking cessation and also lung function testing.  They will call you to schedule that appointment. ? ?For your itchy and watery eyes you can get an antihistamine eye drop over the counter. I have prescribed zyrtec to help with your allergy symptoms.  I also recommended continuing Flonase daily for your allergies. ? ?For your ears we irrigated your left ear and hopefully this gives you relief, but if you are still experiencing pain we will address this at follow-up ? ?I recommend following up with your PCP in about 2 weeks to further discuss hip pain and other things do not get to today. ? ?Please check-out at the front desk before leaving the clinic. Schedule to see your PCP in 2 weeks for follow up, but if you need to be seen earlier than that for any new issues we're happy to fit you in, just give [place] a call! ? ?Visit Reminders: ?- Stop by the pharmacy to pick up your prescriptions  ?- Continue to work on your healthy eating habits and incorporating exercise into your daily life.  ? ?Please bring all of your medications with you to each visit.  ?  ?If you haven't already, sign up for My Chart to have easy access to your labs results, and communication with your primary care physician. ? ?Feel free to call with any questions or concerns at any time, at [phone]. ?  ?Take care,  ?Dr. Cora Collum ?[facility] ?

## 2021-11-06 NOTE — Progress Notes (Deleted)
? ? ?  SUBJECTIVE:  ? ?Chief compliant/HPI: annual examination ? ?Carlos Montoya is a 61 y.o. who presents today for an annual exam.  ? ?History tabs reviewed and updated ***.  ? ?Review of systems form reviewed and notable for ***.  ? ?OBJECTIVE:  ? ?There were no vitals taken for this visit.  ?*** ? ?ASSESSMENT/PLAN:  ? ?No problem-specific Assessment & Plan notes found for this encounter. ?  ? ?Annual Examination  ?See AVS for age appropriate recommendations.  ?PHQ score ***, reviewed and discussed.  ?Blood pressure value is *** goal, discussed.  ? ?Considered the following screening exams based upon USPSTF recommendations: ?Diabetes screening: {discussed/ordered:14545} ?Screening for elevated cholesterol: {discussed/ordered:14545} ?HIV testing: {discussed/ordered:14545} ?Hepatitis C: {discussed/ordered:14545} ?Hepatitis B: {discussed/ordered:14545} ?Syphilis if at high risk: {discussed/ordered:14545} ?Reviewed risk factors for latent tuberculosis and {not indicated/requested/declined:14582} ?Colorectal cancer screening: {crcscreen:23821::"discussed, colonoscopy ordered"} ?Lung cancer screening: {discussed/declined/written LEXN:17001} See documentation below regarding discussion and indication.  ?PSA discussed and after engaging in discussion of possible risks, benefits and complications of screening patient elected to ***.  ? ?Follow up in 1 year or sooner if indicated.  ? ? ?Cora Collum, DO ?[facility]   ?

## 2021-11-07 LAB — LIPID PANEL
Chol/HDL Ratio: 2.7 ratio (ref 0.0–5.0)
Cholesterol, Total: 138 mg/dL (ref 100–199)
HDL: 52 mg/dL (ref >39)
LDL Chol Calc (NIH): 67 mg/dL (ref 0–99)
Triglycerides: 105 mg/dL (ref 0–149)
VLDL Cholesterol Cal: 19 mg/dL (ref 5–40)

## 2021-11-07 LAB — COMPREHENSIVE METABOLIC PANEL
ALT: 37 IU/L (ref 0–44)
AST: 40 IU/L (ref 0–40)
Albumin/Globulin Ratio: 2.1 (ref 1.2–2.2)
Albumin: 4.6 g/dL (ref 3.8–4.9)
Alkaline Phosphatase: 65 IU/L (ref 44–121)
BUN/Creatinine Ratio: 8 — ABNORMAL LOW (ref 10–24)
BUN: 7 mg/dL — ABNORMAL LOW (ref 8–27)
Bilirubin Total: 0.2 mg/dL (ref 0.0–1.2)
CO2: 22 mmol/L (ref 20–29)
Calcium: 9.8 mg/dL (ref 8.6–10.2)
Chloride: 101 mmol/L (ref 96–106)
Creatinine, Ser: 0.86 mg/dL (ref 0.76–1.27)
Globulin, Total: 2.2 g/dL (ref 1.5–4.5)
Glucose: 82 mg/dL (ref 70–99)
Potassium: 4.3 mmol/L (ref 3.5–5.2)
Sodium: 140 mmol/L (ref 134–144)
Total Protein: 6.8 g/dL (ref 6.0–8.5)
eGFR: 99 mL/min/{1.73_m2} (ref >59)

## 2021-11-07 LAB — PSA: Prostate Specific Ag, Serum: 1.5 ng/mL (ref 0.0–4.0)

## 2021-11-09 ENCOUNTER — Other Ambulatory Visit: Payer: Self-pay | Admitting: Family Medicine

## 2021-11-10 NOTE — Progress Notes (Signed)
? ? ?  SUBJECTIVE:  ? ?Chief compliant/HPI: annual examination ? ?Carlos Montoya is a 61 y.o. who presents today for an annual exam.  ? ?States his eyes have been watering, burning; itching; claritin not working ? ?Endorses smoking 1/2 pack twice a week --> tried to quit before with patch, gum, didn't work; not sure about interest in meds ? ?Alcohol: Drinks 4 beers twice a week ? ?For exercise states he walks sometimes (back and hip pain makes it difficult) --> even stationary bike hurts. Endorses L sided hip pain for the past 6 months, worsening. Stabbing pain when trying to get up; no radiation; better with rest for a few minutes. No loss of bladder or bowel function. Falls multiple times with hip and back pain; no major injuries; fall bc of pain, not really bc of unsteadiness; one fall in 2023 so far ? ?Liver cancer - biological father ?Breast cancer - maternal aunt ? ?Seeing a new psychologist soon (fired prior psychologist); no SI.  ? ? ?OBJECTIVE:  ? ?BP 126/79   Pulse 85   Wt 154 lb (69.9 kg)   SpO2 97%   BMI 22.10 kg/m?   ? ?General: alert, NAD ?HEENT: R TM normal, L side with complete cerumen impaction  ?CV: RRR no murmurs  ?Resp: CTAB normal WOB ?GI: soft, non distended  ?Extremities: warm, dry. No LE edema  ? ?Flowsheet Row Office Visit from 09/13/2020 in [facility]  ?PHQ-9 Total Score 19  ? ?  ?  ? ?ASSESSMENT/PLAN:  ? ?Left ear impacted cerumen ?L ear with complete cerumen impaction with resolution after irrigation. Patient felt immediate relief  ? ?Seasonal allergies ?For his itchy and watery eyes recommended an antihistamine eye drop over the counter.Prescribed zyrtec since Claritin is no longer working.  I also recommended continuing Flonase daily. ?  ? ?Annual Examination  ?See AVS for age appropriate recommendations.  ?PHQ score 19, reviewed and discussed. No active SI. Follows with psych and discussed scheduling appointment to be evaluated and medication adjusted   ?Blood pressure value is 126/79 and at goal, discussed.  ? ?Considered the following screening exams based upon USPSTF recommendations: ?Diabetes screening:  order at next visit  ?Screening for elevated cholesterol: ordered ?HIV testing:  not ordered  ?Syphilis if at high risk:  not ordered  ?Reviewed risk factors for latent tuberculosis and not indicated ?Colorectal cancer screening:  ordered  ?Lung cancer screening:  chest CT performed 1 year ago with emphysematous changes and few scattered pulmonary micronodules.  See documentation below regarding discussion and indication.  ?PSA discussed and after engaging in discussion of possible risks, benefits and complications of screening patient elected to check blood work.  ? ?Follow up with PCP in 2 week to discuss mood, hip and back pain ? ? ?Cora Collum, DO ?[facility]   ?

## 2021-11-11 ENCOUNTER — Encounter: Payer: Self-pay | Admitting: Family Medicine

## 2021-11-11 NOTE — Assessment & Plan Note (Signed)
L ear with complete cerumen impaction with resolution after irrigation. Patient felt immediate relief  ?

## 2021-11-11 NOTE — Assessment & Plan Note (Signed)
For his itchy and watery eyes recommended an antihistamine eye drop over the counter.Prescribed zyrtec since Claritin is no longer working.  I also recommended continuing Flonase daily. ?

## 2021-12-11 ENCOUNTER — Other Ambulatory Visit: Payer: Self-pay

## 2022-01-28 ENCOUNTER — Telehealth: Payer: Self-pay | Admitting: *Deleted

## 2022-01-28 NOTE — Telephone Encounter (Signed)
Patient left a message on the referral line requesting an update referral be placed for both gen surgery for his hernia and behavioral health.  Will forward to MD.  Burnard Hawthorne

## 2022-01-29 ENCOUNTER — Other Ambulatory Visit: Payer: Self-pay | Admitting: Family Medicine

## 2022-01-29 DIAGNOSIS — K429 Umbilical hernia without obstruction or gangrene: Secondary | ICD-10-CM

## 2022-01-29 DIAGNOSIS — F331 Major depressive disorder, recurrent, moderate: Secondary | ICD-10-CM

## 2022-01-29 NOTE — Telephone Encounter (Signed)
Referrals placed 

## 2022-02-16 ENCOUNTER — Other Ambulatory Visit: Payer: Self-pay | Admitting: Family Medicine

## 2022-02-24 ENCOUNTER — Ambulatory Visit (HOSPITAL_COMMUNITY): Payer: Medicaid Other | Admitting: Student

## 2022-02-27 ENCOUNTER — Encounter (HOSPITAL_COMMUNITY): Payer: Self-pay | Admitting: Student

## 2022-02-27 ENCOUNTER — Ambulatory Visit (INDEPENDENT_AMBULATORY_CARE_PROVIDER_SITE_OTHER): Payer: Medicaid Other | Admitting: Student

## 2022-02-27 VITALS — BP 146/97 | HR 91 | Temp 98.2°F | Resp 16 | Ht 71.0 in | Wt 149.0 lb

## 2022-02-27 DIAGNOSIS — G4700 Insomnia, unspecified: Secondary | ICD-10-CM | POA: Diagnosis not present

## 2022-02-27 DIAGNOSIS — F331 Major depressive disorder, recurrent, moderate: Secondary | ICD-10-CM

## 2022-02-27 DIAGNOSIS — F411 Generalized anxiety disorder: Secondary | ICD-10-CM | POA: Diagnosis not present

## 2022-02-27 MED ORDER — MIRTAZAPINE 15 MG PO TABS: ORAL_TABLET | ORAL | 2 refills | Status: DC

## 2022-02-27 NOTE — Progress Notes (Signed)
Sent message, via epic in basket, requesting orders in epic from surgeon.  

## 2022-02-28 ENCOUNTER — Ambulatory Visit: Payer: Self-pay | Admitting: General Surgery

## 2022-02-28 ENCOUNTER — Encounter (HOSPITAL_COMMUNITY): Payer: Self-pay | Admitting: Student

## 2022-02-28 DIAGNOSIS — F411 Generalized anxiety disorder: Secondary | ICD-10-CM | POA: Insufficient documentation

## 2022-02-28 DIAGNOSIS — F1020 Alcohol dependence, uncomplicated: Secondary | ICD-10-CM

## 2022-02-28 NOTE — Progress Notes (Signed)
Anesthesia Review:  PCP: Cardiologist : Chest x-ray : 04/01/21- 2 view  EKG : 04/02/21  Echo : Stress test: Cardiac Cath :  Activity level:  Sleep Study/ CPAP : Fasting Blood Sugar :      / Checks Blood Sugar -- times a day:   Blood Thinner/ Instructions /Last Dose: ASA / Instructions/ Last Dose :   81 mg aspirin

## 2022-02-28 NOTE — Patient Instructions (Signed)
SURGICAL WAITING ROOM VISITATION Patients having surgery or a procedure may have no more than 2 support people in the waiting area - these visitors may rotate.   Children under the age of 23 must have an adult with them who is not the patient. If the patient needs to stay at the hospital during part of their recovery, the visitor guidelines for inpatient rooms apply. Pre-op nurse will coordinate an appropriate time for 1 support person to accompany patient in pre-op.  This support person may not rotate.    Please refer to the [facility] website for the visitor guidelines for Inpatients (after your surgery is over and you are in a regular room).       Your procedure is scheduled on:  03/14/2022    Report to [facility] Main Entrance    Report to admitting at   1100AM   Call this number if you have problems the morning of surgery [phone]   Do not eat food :After Midnight.   After Midnight you may have the following liquids until ___ 1000___ AM  DAY OF SURGERY  Water Non-Citrus Juices (without pulp, NO RED) Carbonated Beverages Black Coffee (NO MILK/CREAM OR CREAMERS, sugar ok)  Clear Tea (NO MILK/CREAM OR CREAMERS, sugar ok) regular and decaf                             Plain Jell-O (NO RED)                                           Fruit ices (not with fruit pulp, NO RED)                                     Popsicles (NO RED)                                                               Sports drinks like Gatorade (NO RED)                            Oral Hygiene is also important to reduce your risk of infection.                                    Remember - BRUSH YOUR TEETH THE MORNING OF SURGERY WITH YOUR REGULAR TOOTHPASTE   Do NOT smoke after Midnight   Take these medicines the morning of surgery with A SIP OF WATER:  none   DO NOT TAKE ANY ORAL DIABETIC MEDICATIONS DAY OF YOUR SURGERY  Bring CPAP mask and tubing day of surgery.                               You may not have any metal on your body including hair pins, jewelry, and body piercing             Do not  wear make-up, lotions, powders, perfumes/cologne, or deodorant  Do not wear nail polish including gel and S&S, artificial/acrylic nails, or any other type of covering on natural nails including finger and toenails. If you have artificial nails, gel coating, etc. that needs to be removed by a nail salon please have this removed prior to surgery or surgery may need to be canceled/ delayed if the surgeon/ anesthesia feels like they are unable to be safely monitored.   Do not shave  48 hours prior to surgery.               Men may shave face and neck.   Do not bring valuables to the hospital. [place].   Contacts, dentures or bridgework may not be worn into surgery.   Bring small overnight bag day of surgery.   DO NOT [place]. PHARMACY WILL DISPENSE MEDICATIONS LISTED ON YOUR MEDICATION LIST TO YOU DURING YOUR ADMISSION [place]!    Patients discharged on the day of surgery will not be allowed to drive home.  Someone NEEDS to stay with you for the first 24 hours after anesthesia.   Special Instructions: Bring a copy of your healthcare power of attorney and living will documents         the day of surgery if you haven't scanned them before.              Please read over the following fact sheets you were given: IF YOU HAVE QUESTIONS ABOUT YOUR PRE-OP INSTRUCTIONS PLEASE CALL [phone]     [facility] - Preparing for Surgery Before surgery, you can play an important role.  Because skin is not sterile, your skin needs to be as free of germs as possible.  You can reduce the number of germs on your skin by washing with CHG (chlorahexidine gluconate) soap before surgery.  CHG is an antiseptic cleaner which kills germs and bonds with the skin to continue killing germs even after  washing. Please DO NOT use if you have an allergy to CHG or antibacterial soaps.  If your skin becomes reddened/irritated stop using the CHG and inform your nurse when you arrive at Short Stay. Do not shave (including legs and underarms) for at least 48 hours prior to the first CHG shower.  You may shave your face/neck. Please follow these instructions carefully:  1.  Shower with CHG Soap the night before surgery and the  morning of Surgery.  2.  If you choose to wash your hair, wash your hair first as usual with your  normal  shampoo.  3.  After you shampoo, rinse your hair and body thoroughly to remove the  shampoo.                           4.  Use CHG as you would any other liquid soap.  You can apply chg directly  to the skin and wash                       Gently with a scrungie or clean washcloth.  5.  Apply the CHG Soap to your body ONLY FROM THE NECK DOWN.   Do not use on face/ open  Wound or open sores. Avoid contact with eyes, ears mouth and genitals (private parts).                       Wash face,  Genitals (private parts) with your normal soap.             6.  Wash thoroughly, paying special attention to the area where your surgery  will be performed.  7.  Thoroughly rinse your body with warm water from the neck down.  8.  DO NOT shower/wash with your normal soap after using and rinsing off  the CHG Soap.                9.  Pat yourself dry with a clean towel.            10.  Wear clean pajamas.            11.  Place clean sheets on your bed the night of your first shower and do not  sleep with pets. Day of Surgery : Do not apply any lotions/deodorants the morning of surgery.  Please wear clean clothes to the hospital/surgery center.  FAILURE TO FOLLOW THESE INSTRUCTIONS MAY RESULT IN THE CANCELLATION OF YOUR SURGERY PATIENT SIGNATURE_________________________________  NURSE  SIGNATURE__________________________________  ________________________________________________________________________

## 2022-02-28 NOTE — Progress Notes (Signed)
Psychiatric Initial Adult Assessment  Patient Identification: Carlos Montoya MRN:  [id number] Date of Evaluation:  02/28/2022 Referral Source: PCP  Assessment:  Carlos Montoya is a 61 y.o. y.o. male with a history of anxiety, depression, umbilical hernia, chronic pain, hepatitis C who presents in person to [facility] for initial evaluation of depression, insomnia, and anxiety.  Patient reports persistent depressed mood with associated insomnia, guilt, decreased energy and concentration consistent with major depressive disorder.  He also reports racing thoughts and worry regarding multiple different topics with associated insomnia, irritability, and concentration, consistent with generalized anxiety disorder.  He reports interest in starting a new psychiatric medication to help with mood and sleep.  He is also interested and are starting therapy with the [facility].  Plan:  #Major depressive disorder Past medication trials: Prozac 20 mg for years and Zoloft (patient unsure about dosage). Status of problem: New problem to this provider Interventions: Start Remeron 7.5 mg nightly, increase to 15 mg nightly after 1 week. - Attempt to obtain previous psychiatric records from [place].  Request for information sent.  # Generalized Anxiety Disorder Past medication trials: As above Status of problem: New problem for this provider Interventions: As above -- Front desk to schedule therapy for the patient   Patient was given contact information for behavioral health clinic and was instructed to call 911 for emergencies.   Subjective:  Chief Complaint:  Chief Complaint  Patient presents with   Depression    History of Present Illness:   Carlos Montoya is a 61 year old male with a self-reported past psychiatric history of bipolar affective disorder and anxiety and depression.  The patient presents to the [facility] for a new patient appointment, requesting help with depression, insomnia, and anxiety.  The patient reports that he last received psychiatric care with [facility], specifically Dr. Wilson Singer in October of last year.  He states that he was dissatisfied with the care that he received there.  He states that he had been taking Prozac 20 mg daily for approximately 3 years and this medication did not work for him.  Regarding his present symptoms, the patient reports experiencing depression for his whole life.  He reports poor sleep, being able to fall asleep but then waking up 3 hours later and feeling tired.  He denies anhedonia but does report significant feelings of guilt as well as decreased energy and concentration.  He reports that his appetite is "so-so".  He denies experiencing any suicidal thoughts.  Reports significant anxiety symptoms, such as racing thoughts and worries about multiple different issues.  He reports panic attacks that occur "sometimes".  Regarding his stated history of bipolar affective disorder, the patient reports that he is unsure if he has had a manic episode previously.  When a manic/hypomanic episode is described, the patient says "maybe".  The patient does not wish to discuss any exposure to traumatic experiences.  The patient does report that anger and irritability are a problem for him.  He reports that he has been unable to work because of this problem.  He also reports that the last time he used the bus there were problems related to his anger.  The patient denies experiencing any auditory or visual hallucinations.  He denies experiencing any homicidal thoughts.  The patient reports a few previous psychiatric hospitalizations.  The most recent of these hospitalizations occurred 4 years ago.  The patient states he went to a psychiatric hospital  in [place] for suicidal thoughts.  He also notes that he believed then and now that there is an alien invasion that will  occur in the near future.  He states that he has a Scientist, forensic sword at home and that he has cans of food prepared in case this happens.  The patient denies access to any guns at home.  Regarding social history, the patient reports that he was born in [place].  He states that he moved to [place] in 1989.  He reports working in many jobs previously, including being in [employer], and also working in phlebotomy most recently.  The patient states that he stopped working 10 years ago.  He states that he has been receiving SSI since that time for pain and mental health reasons.  He currently lives in an apartment alone.  He reports that he does not have much social support in the area.  He states that he has a brother who lives in [place], with whom he has a distant relationship.  The patient reports that he spends most of his time watching documentaries and going to appointments.  The patient does have transportation, a Medicaid van that he is able to call.  Associated Signs/Symptoms: Depression Symptoms:  depressed mood, insomnia, feelings of worthlessness/guilt, difficulty concentrating, (Hypo) Manic Symptoms:   NA Anxiety Symptoms:  Excessive Worry, Psychotic symptoms: reports an alien invasion that is imminent  PTSD Symptoms:  not discussed per patient preference  Past Psychiatric History: as above  Previous Psychotropic Medications: Yes   Substance Abuse History in the last 12 months:  No.  Consequences of Substance Abuse: NA  Past Medical History:  Past Medical History:  Diagnosis Date   Acid reflux    Alcohol use disorder, mild, abuse    Anxiety    Bipolar disorder (HCC)    COPD (chronic obstructive pulmonary disease) (HCC)    Degenerative disc disease, lumbar    Depression    History of hepatitis C 2016   treated   Opioid dependence (HCC)    Umbilical hernia     Past Surgical History:  Procedure Laterality Date   TONSILLECTOMY      Family  Psychiatric History: denies FMHx of major mental illness or substance use  Family History:  Family History  Problem Relation Age of Onset   Diabetes Mother    Heart disease Father    Colon cancer Father     Social History:   Social History   Socioeconomic History   Marital status: Single    Spouse name: Not on file   Number of children: Not on file   Years of education: Not on file   Highest education level: Not on file  Occupational History   Not on file  Tobacco Use   Smoking status: Every Day    Packs/day: 0.50    Types: Cigarettes   Smokeless tobacco: Never  Substance and Sexual Activity   Alcohol use: Yes    Comment: drinks beer   Drug use: No   Sexual activity: Not on file  Other Topics Concern   Not on file  Social History Narrative   Not on file   Social Determinants of Health   Financial Resource Strain: Not on file  Food Insecurity: Not on file  Transportation Needs: Not on file  Physical Activity: Not on file  Stress: Not on file  Social Connections: Not on file    Additional Social History: NA  Allergies:   Allergies  Allergen Reactions   Prednisone Hives   Diphenhydramine Hcl Other (See Comments)    hyper    Current Medications: Current Outpatient Medications  Medication Sig Dispense Refill   aspirin EC 81 MG tablet Take 81 mg by mouth daily. Swallow whole.     MILK THISTLE PO Take 1 tablet by mouth daily.     mirtazapine (REMERON) 15 MG tablet Take 1/2 tablet (7.5 mg total) nightly for 6 days then INCREASE to 1 tablet (15 mg total) nightly thereafter. 30 tablet 2   atorvastatin (LIPITOR) 20 MG tablet Take 1 tablet (20 mg total) by mouth daily. (Patient not taking: Reported on 02/27/2022) 90 tablet 3   carbamide peroxide (DEBROX) 6.5 % OTIC solution Place 5 drops into the left ear 2 (two) times daily. (Patient not taking: Reported on 02/27/2022) 15 mL 0   cetirizine (ZYRTEC ALLERGY) 10 MG tablet Take 1 tablet (10 mg total) by mouth daily.  (Patient not taking: Reported on 02/27/2022) 30 tablet 0   FLOVENT HFA 44 MCG/ACT inhaler INHALE 2 PUFFS INTO THE LUNGS TWICE A DAY (Patient not taking: Reported on 02/27/2022) 10.6 each 12   fluticasone (FLONASE) 50 MCG/ACT nasal spray Place 2 sprays into both nostrils daily. (Patient not taking: Reported on 02/27/2022) 16 g 6   gabapentin (NEURONTIN) 600 MG tablet TAKE 1 TABLET BY MOUTH THREE TIMES A DAY (Patient not taking: Reported on 09/13/2020) 60 tablet 2   ibuprofen (ADVIL) 600 MG tablet TAKE 1 TABLET BY MOUTH EVERY 6 HOURS AS NEEDED (Patient not taking: Reported on 02/27/2022) 90 tablet 0   nicotine (NICODERM CQ - DOSED IN MG/24 HOURS) 14 mg/24hr patch Place 1 patch (14 mg total) onto the skin daily. (Patient not taking: Reported on 02/27/2022) 28 patch 1   omeprazole (PRILOSEC) 20 MG capsule TAKE 1 CAPSULE BY MOUTH EVERY DAY (Patient not taking: Reported on 02/27/2022) 90 capsule 1   VENTOLIN HFA 108 (90 Base) MCG/ACT inhaler TAKE 2 PUFFS BY MOUTH EVERY 6 HOURS AS NEEDED (Patient not taking: Reported on 02/27/2022) 18 each 0   No current facility-administered medications for this visit.    Objective:  Psychiatric Specialty Exam: Review of Systems  Respiratory:  Negative for shortness of breath.   Cardiovascular:  Negative for chest pain.  Gastrointestinal:  Negative for abdominal pain, constipation, diarrhea, nausea and vomiting.  Neurological:  Negative for headaches.       .BP (!) 146/97   Pulse 91   Temp 98.2 F (36.8 C)   Resp 16   Ht 5\' 11"  (1.803 m)   Wt 149 lb (67.6 kg)   SpO2 98%   BMI 20.78 kg/m    General Appearance: Fairly Groomed  Eye Contact:  Good  Speech:  Clear and Coherent  Volume:  Normal  Mood:  Euthymic  Affect:  Congruent  Thought Process:  Coherent  Orientation:  Full (Time, Place, and Person)  Thought Content: Logical   Suicidal Thoughts:  No  Homicidal Thoughts:  No  Memory:  Immediate;   Good  Judgement:  Good  Insight:  Good  Psychomotor  Activity:  Normal  Concentration:  Concentration: Good  Recall:  Good  Fund of Knowledge: Good  Language: Good  Akathisia:  No  Handed:    AIMS (if indicated): not done  Assets:  Communication Skills Desire for Improvement Financial Resources/Insurance Housing Leisure Time Physical Health  ADL's:  Intact  Cognition: WNL  Sleep:  Fair   PE: General: well-appearing; no acute distress  Pulm: no increased work of breathing on room air  Strength & Muscle Tone: within normal limits Neuro: no focal neurological deficits observed  Gait & Station: normal  Metabolic Disorder Labs: No results found for: "HGBA1C", "MPG" No results found for: "PROLACTIN" Lab Results  Component Value Date   CHOL 138 11/06/2021   TRIG 105 11/06/2021   HDL 52 11/06/2021   CHOLHDL 2.7 11/06/2021   LDLCALC 67 11/06/2021   LDLCALC 114 (H) 09/13/2020   Lab Results  Component Value Date   TSH 1.410 09/13/2020    Therapeutic Level Labs: No results found for: "LITHIUM" No results found for: "CBMZ" No results found for: "VALPROATE"  Screenings:  PHQ2-9    Flowsheet Row Office Visit from 02/27/2022 in [facility] Office Visit from 09/13/2020 in [facility] Office Visit from 02/25/2019 in [facility] Office Visit from 11/30/2018 in [facility] Office Visit from 12/11/2017 in [facility]  PHQ-2 Total Score 1 5 1 1  0  PHQ-9 Total Score -- 19 -- -- --      Flowsheet Row ED from 04/01/2021 in [facility]  C-SSRS RISK CATEGORY No Risk       Collaboration of Care: Collaboration of Care: Other none needed  Patient/Guardian was advised Release of Information must be obtained prior to any record release in order to collaborate their care with an outside provider. Patient/Guardian was advised if they have not already done so to contact the registration department  to sign all necessary forms in order for 04/03/2021 to release information regarding their care.   Consent: Patient/Guardian gives verbal consent for treatment and assignment of benefits for services provided during this visit. Patient/Guardian expressed understanding and agreed to proceed.   A total of 60 minutes was spent involved in face to face clinical care, chart review, documentation.   [place], MD 8/25/20237:19 AM

## 2022-03-03 ENCOUNTER — Telehealth (HOSPITAL_COMMUNITY): Payer: Self-pay | Admitting: *Deleted

## 2022-03-03 ENCOUNTER — Telehealth (HOSPITAL_COMMUNITY): Payer: Self-pay | Admitting: Student

## 2022-03-03 ENCOUNTER — Inpatient Hospital Stay (HOSPITAL_COMMUNITY)
Admission: RE | Admit: 2022-03-03 | Discharge: 2022-03-03 | Disposition: A | Discharge status: Home / Self Care | Payer: Medicaid Other | Source: Ambulatory Visit

## 2022-03-03 DIAGNOSIS — Z01818 Encounter for other preprocedural examination: Secondary | ICD-10-CM

## 2022-03-03 NOTE — Telephone Encounter (Signed)
Patient LVM requested to speak with provider  stated that he was told to call with any questions or concerns

## 2022-03-04 NOTE — Telephone Encounter (Signed)
Encounter opened in error

## 2022-03-05 ENCOUNTER — Encounter (HOSPITAL_COMMUNITY): Payer: Self-pay

## 2022-03-05 ENCOUNTER — Other Ambulatory Visit: Payer: Self-pay

## 2022-03-05 ENCOUNTER — Inpatient Hospital Stay (HOSPITAL_COMMUNITY)
Admission: RE | Admit: 2022-03-05 | Discharge: 2022-03-05 | Disposition: A | Discharge status: Home / Self Care | Payer: Medicaid Other | Source: Ambulatory Visit

## 2022-03-05 HISTORY — DX: Pneumonia, unspecified organism: J18.9

## 2022-03-05 NOTE — Patient Instructions (Signed)
SURGICAL WAITING ROOM VISITATION Patients having surgery or a procedure may have no more than 2 support people in the waiting area - these visitors may rotate.   Children under the age of 58 must have an adult with them who is not the patient. If the patient needs to stay at the hospital during part of their recovery, the visitor guidelines for inpatient rooms apply. Pre-op nurse will coordinate an appropriate time for 1 support person to accompany patient in pre-op.  This support person may not rotate.    Please refer to the [facility] website for the visitor guidelines for Inpatients (after your surgery is over and you are in a regular room).       Your procedure is scheduled on:  03/14/2022    Report to [facility] Main Entrance    Report to admitting at  1100 AM   Call this number if you have problems the morning of surgery [phone]   Do not eat food :After Midnight.   After Midnight you may have the following liquids until ____ 1000__ AM  DAY OF SURGERY  Water Non-Citrus Juices (without pulp, NO RED) Carbonated Beverages Black Coffee (NO MILK/CREAM OR CREAMERS, sugar ok)  Clear Tea (NO MILK/CREAM OR CREAMERS, sugar ok) regular and decaf                             Plain Jell-O (NO RED)                                           Fruit ices (not with fruit pulp, NO RED)                                     Popsicles (NO RED)                                                               Sports drinks like Gatorade (NO RED)                      The day of surgery:  Drink ONE (1) Pre-Surgery Clear Ensure or G2 at  1000 AM  ( have completed by ) the morning of surgery. Drink in one sitting. Do not sip.  This drink was given to you during your hospital  pre-op appointment visit. Nothing else to drink after completing the  Pre-Surgery Clear Ensure or G2.           Oral Hygiene is also important to reduce your risk of infection.                                     Remember - BRUSH YOUR TEETH THE MORNING OF SURGERY WITH YOUR REGULAR TOOTHPASTE   Do NOT smoke after Midnight   Take these medicines the morning of surgery with A SIP OF WATER:  Inhaler as usual , Omeprazole    DO NOT TAKE ANY ORAL DIABETIC MEDICATIONS DAY OF  YOUR SURGERY  Bring CPAP mask and tubing day of surgery.                              You may not have any metal on your body including hair pins, jewelry, and body piercing             Do not wear make-up, lotions, powders, perfumes/cologne, or deodorant  Do not wear nail polish including gel and S&S, artificial/acrylic nails, or any other type of covering on natural nails including finger and toenails. If you have artificial nails, gel coating, etc. that needs to be removed by a nail salon please have this removed prior to surgery or surgery may need to be canceled/ delayed if the surgeon/ anesthesia feels like they are unable to be safely monitored.   Do not shave  48 hours prior to surgery.               Men may shave face and neck.   Do not bring valuables to the hospital. [facility] IS NOT             RESPONSIBLE   FOR VALUABLES.   Contacts, dentures or bridgework may not be worn into surgery.   Bring small overnight bag day of surgery.   DO NOT BRING YOUR HOME MEDICATIONS TO THE HOSPITAL. PHARMACY WILL DISPENSE MEDICATIONS LISTED ON YOUR MEDICATION LIST TO YOU DURING YOUR ADMISSION IN THE HOSPITAL!    Patients discharged on the day of surgery will not be allowed to drive home.  Someone NEEDS to stay with you for the first 24 hours after anesthesia.   Special Instructions: Bring a copy of your healthcare power of attorney and living will documents         the day of surgery if you haven't scanned them before.              Please read over the following fact sheets you were given: IF YOU HAVE QUESTIONS ABOUT YOUR PRE-OP INSTRUCTIONS PLEASE CALL [phone]     [facility] - Preparing for Surgery Before surgery, you  can play an important role.  Because skin is not sterile, your skin needs to be as free of germs as possible.  You can reduce the number of germs on your skin by washing with CHG (chlorahexidine gluconate) soap before surgery.  CHG is an antiseptic cleaner which kills germs and bonds with the skin to continue killing germs even after washing. Please DO NOT use if you have an allergy to CHG or antibacterial soaps.  If your skin becomes reddened/irritated stop using the CHG and inform your nurse when you arrive at Short Stay. Do not shave (including legs and underarms) for at least 48 hours prior to the first CHG shower.  You may shave your face/neck. Please follow these instructions carefully:  1.  Shower with CHG Soap the night before surgery and the  morning of Surgery.  2.  If you choose to wash your hair, wash your hair first as usual with your  normal  shampoo.  3.  After you shampoo, rinse your hair and body thoroughly to remove the  shampoo.                           4.  Use CHG as you would any other liquid soap.  You can apply chg directly  to the skin  and wash                       Gently with a scrungie or clean washcloth.  5.  Apply the CHG Soap to your body ONLY FROM THE NECK DOWN.   Do not use on face/ open                           Wound or open sores. Avoid contact with eyes, ears mouth and genitals (private parts).                       Wash face,  Genitals (private parts) with your normal soap.             6.  Wash thoroughly, paying special attention to the area where your surgery  will be performed.  7.  Thoroughly rinse your body with warm water from the neck down.  8.  DO NOT shower/wash with your normal soap after using and rinsing off  the CHG Soap.                9.  Pat yourself dry with a clean towel.            10.  Wear clean pajamas.            11.  Place clean sheets on your bed the night of your first shower and do not  sleep with pets. Day of Surgery : Do not apply  any lotions/deodorants the morning of surgery.  Please wear clean clothes to the hospital/surgery center.  FAILURE TO FOLLOW THESE INSTRUCTIONS MAY RESULT IN THE CANCELLATION OF YOUR SURGERY PATIENT SIGNATURE_________________________________  NURSE SIGNATURE__________________________________  ________________________________________________________________________

## 2022-03-05 NOTE — Progress Notes (Signed)
PT called and stated his medical transportation did not show up for preop appt.  MEd hx and instructions completed via phone.  Pt set up appt to come in on 02/09/2022 for lab appt at 1pm.  PT will pick up his instructions along with his drink and hibiclens on that day.  Bag in lab.

## 2022-03-07 NOTE — Telephone Encounter (Cosign Needed)
The following occurred on 8/28 via telephone:  Patient had a question about potential medication side effect and his disability paperwork.  The patient reports that he "saw a spider on my bed and then I blinked and it was gone".  He is wondering if this was a hallucination induced by the medication.  The patient was given reassurance and told that he can continue taking his medication.   The patient had a question about the wording of one of the questions on the disability paperwork.  He was given reassurance and decided to select the same response he gave on his previous round of disability paperwork.

## 2022-03-12 ENCOUNTER — Encounter (HOSPITAL_COMMUNITY)
Admission: RE | Admit: 2022-03-12 | Discharge: 2022-03-12 | Disposition: A | Discharge status: Home / Self Care | Payer: Medicaid Other | Source: Ambulatory Visit | Attending: General Surgery | Admitting: General Surgery

## 2022-03-12 DIAGNOSIS — F1020 Alcohol dependence, uncomplicated: Secondary | ICD-10-CM | POA: Diagnosis not present

## 2022-03-12 DIAGNOSIS — Z01812 Encounter for preprocedural laboratory examination: Secondary | ICD-10-CM | POA: Insufficient documentation

## 2022-03-12 LAB — COMPREHENSIVE METABOLIC PANEL
ALT: 33 U/L (ref 0–44)
AST: 43 U/L — ABNORMAL HIGH (ref 15–41)
Albumin: 4 g/dL (ref 3.5–5.0)
Alkaline Phosphatase: 63 U/L (ref 38–126)
Anion gap: 6 (ref 5–15)
BUN: 9 mg/dL (ref 6–20)
CO2: 27 mmol/L (ref 22–32)
Calcium: 9.9 mg/dL (ref 8.9–10.3)
Chloride: 108 mmol/L (ref 98–111)
Creatinine, Ser: 0.94 mg/dL (ref 0.61–1.24)
GFR, Estimated: >60 mL/min (ref >60)
Glucose, Bld: 91 mg/dL (ref 70–99)
Potassium: 4.4 mmol/L (ref 3.5–5.1)
Sodium: 141 mmol/L (ref 135–145)
Total Bilirubin: 0.5 mg/dL (ref 0.3–1.2)
Total Protein: 7.3 g/dL (ref 6.5–8.1)

## 2022-03-12 LAB — CBC WITH DIFFERENTIAL/PLATELET
Abs Immature Granulocytes: 0.02 10*3/uL (ref 0.00–0.07)
Basophils Absolute: 0.1 10*3/uL (ref 0.0–0.1)
Basophils Relative: 1 %
Eosinophils Absolute: 0.2 10*3/uL (ref 0.0–0.5)
Eosinophils Relative: 3 %
HCT: 47.1 % (ref 39.0–52.0)
Hemoglobin: 16 g/dL (ref 13.0–17.0)
Immature Granulocytes: 0 %
Lymphocytes Relative: 20 %
Lymphs Abs: 1.5 10*3/uL (ref 0.7–4.0)
MCH: 34.1 pg — ABNORMAL HIGH (ref 26.0–34.0)
MCHC: 34 g/dL (ref 30.0–36.0)
MCV: 100.4 fL — ABNORMAL HIGH (ref 80.0–100.0)
Monocytes Absolute: 0.6 10*3/uL (ref 0.1–1.0)
Monocytes Relative: 7 %
Neutro Abs: 5.3 10*3/uL (ref 1.7–7.7)
Neutrophils Relative %: 69 %
Platelets: 251 10*3/uL (ref 150–400)
RBC: 4.69 MIL/uL (ref 4.22–5.81)
RDW: 12.9 % (ref 11.5–15.5)
WBC: 7.7 10*3/uL (ref 4.0–10.5)
nRBC: 0 % (ref 0.0–0.2)

## 2022-03-12 LAB — PROTIME-INR
INR: 1 (ref 0.8–1.2)
Prothrombin Time: 12.8 seconds (ref 11.4–15.2)

## 2022-03-12 NOTE — Progress Notes (Addendum)
Anesthesia Review:  PCP: DR Ruthell Rummage 11/06/21.  Cardiologist : none  Chest x-ray :04/01/21- 2 view  EKG : 04/02/21  Echo : Stress test: Cardiac Cath :  Activity level: can do a flight of stairs withouit difficutly  Sleep Study/ CPAP : none  Fasting Blood Sugar :      / Checks Blood Sugar -- times a day:   Blood Thinner/ Instructions /Last Dose: ASA / Instructions/ Last Dose :   81 mg aspirin

## 2022-03-14 ENCOUNTER — Encounter (HOSPITAL_COMMUNITY): Payer: Self-pay | Admitting: General Surgery

## 2022-03-14 ENCOUNTER — Encounter (HOSPITAL_COMMUNITY): Payer: Self-pay

## 2022-03-14 ENCOUNTER — Ambulatory Visit (HOSPITAL_COMMUNITY): Payer: Medicaid Other | Admitting: Physician Assistant

## 2022-03-14 ENCOUNTER — Ambulatory Visit (HOSPITAL_BASED_OUTPATIENT_CLINIC_OR_DEPARTMENT_OTHER): Payer: Medicaid Other | Admitting: Registered Nurse

## 2022-03-14 ENCOUNTER — Emergency Department (HOSPITAL_COMMUNITY): Payer: Medicaid Other

## 2022-03-14 ENCOUNTER — Ambulatory Visit (HOSPITAL_COMMUNITY)
Admission: RE | Admit: 2022-03-14 | Discharge: 2022-03-14 | Disposition: A | Discharge status: Home / Self Care | Payer: Medicaid Other | Attending: General Surgery | Admitting: General Surgery

## 2022-03-14 ENCOUNTER — Inpatient Hospital Stay (HOSPITAL_COMMUNITY)
Admission: EM | Admit: 2022-03-14 | Discharge: 2022-03-17 | DRG: 948 | Disposition: A | Discharge status: Home / Self Care | Payer: Medicaid Other | Attending: General Surgery | Admitting: General Surgery

## 2022-03-14 ENCOUNTER — Encounter (HOSPITAL_COMMUNITY)
Admission: RE | Disposition: A | Discharge status: Home / Self Care | Payer: Self-pay | Source: Home / Self Care | Attending: General Surgery

## 2022-03-14 ENCOUNTER — Other Ambulatory Visit: Payer: Self-pay

## 2022-03-14 DIAGNOSIS — K219 Gastro-esophageal reflux disease without esophagitis: Secondary | ICD-10-CM | POA: Diagnosis not present

## 2022-03-14 DIAGNOSIS — Z888 Allergy status to other drugs, medicaments and biological substances status: Secondary | ICD-10-CM

## 2022-03-14 DIAGNOSIS — Z7982 Long term (current) use of aspirin: Secondary | ICD-10-CM

## 2022-03-14 DIAGNOSIS — F17210 Nicotine dependence, cigarettes, uncomplicated: Secondary | ICD-10-CM

## 2022-03-14 DIAGNOSIS — J449 Chronic obstructive pulmonary disease, unspecified: Secondary | ICD-10-CM | POA: Insufficient documentation

## 2022-03-14 DIAGNOSIS — Z79899 Other long term (current) drug therapy: Secondary | ICD-10-CM

## 2022-03-14 DIAGNOSIS — Z886 Allergy status to analgesic agent status: Secondary | ICD-10-CM

## 2022-03-14 DIAGNOSIS — Z8249 Family history of ischemic heart disease and other diseases of the circulatory system: Secondary | ICD-10-CM

## 2022-03-14 DIAGNOSIS — K420 Umbilical hernia with obstruction, without gangrene: Secondary | ICD-10-CM | POA: Diagnosis present

## 2022-03-14 DIAGNOSIS — Z720 Tobacco use: Secondary | ICD-10-CM | POA: Diagnosis present

## 2022-03-14 DIAGNOSIS — K429 Umbilical hernia without obstruction or gangrene: Secondary | ICD-10-CM | POA: Diagnosis present

## 2022-03-14 DIAGNOSIS — J302 Other seasonal allergic rhinitis: Secondary | ICD-10-CM | POA: Diagnosis present

## 2022-03-14 DIAGNOSIS — Z8719 Personal history of other diseases of the digestive system: Secondary | ICD-10-CM

## 2022-03-14 DIAGNOSIS — Z833 Family history of diabetes mellitus: Secondary | ICD-10-CM

## 2022-03-14 DIAGNOSIS — F319 Bipolar disorder, unspecified: Secondary | ICD-10-CM | POA: Diagnosis present

## 2022-03-14 DIAGNOSIS — Z01818 Encounter for other preprocedural examination: Secondary | ICD-10-CM

## 2022-03-14 DIAGNOSIS — Z800 Family history of malignant neoplasm of digestive organs: Secondary | ICD-10-CM

## 2022-03-14 DIAGNOSIS — F411 Generalized anxiety disorder: Secondary | ICD-10-CM | POA: Diagnosis present

## 2022-03-14 DIAGNOSIS — G8918 Other acute postprocedural pain: Principal | ICD-10-CM

## 2022-03-14 DIAGNOSIS — F17200 Nicotine dependence, unspecified, uncomplicated: Secondary | ICD-10-CM | POA: Diagnosis present

## 2022-03-14 DIAGNOSIS — G894 Chronic pain syndrome: Secondary | ICD-10-CM | POA: Diagnosis present

## 2022-03-14 HISTORY — PX: UMBILICAL HERNIA REPAIR: SHX196

## 2022-03-14 LAB — CBC
HCT: 44.9 % (ref 39.0–52.0)
Hemoglobin: 15.2 g/dL (ref 13.0–17.0)
MCH: 34.4 pg — ABNORMAL HIGH (ref 26.0–34.0)
MCHC: 33.9 g/dL (ref 30.0–36.0)
MCV: 101.6 fL — ABNORMAL HIGH (ref 80.0–100.0)
Platelets: 247 10*3/uL (ref 150–400)
RBC: 4.42 MIL/uL (ref 4.22–5.81)
RDW: 12.8 % (ref 11.5–15.5)
WBC: 9.1 10*3/uL (ref 4.0–10.5)
nRBC: 0 % (ref 0.0–0.2)

## 2022-03-14 SURGERY — REPAIR, HERNIA, UMBILICAL, LAPAROSCOPIC
Anesthesia: General | Site: Abdomen

## 2022-03-14 MED ORDER — CHLORHEXIDINE GLUCONATE CLOTH 2 % EX PADS: 6.0000 | MEDICATED_PAD | Freq: Once | CUTANEOUS | Status: DC

## 2022-03-14 MED ORDER — FENTANYL CITRATE PF 50 MCG/ML IJ SOSY
100.0000 ug | PREFILLED_SYRINGE | Freq: Once | INTRAMUSCULAR | Status: AC
  Administered 2022-03-14: 100 ug via INTRAVENOUS
  Filled 2022-03-14: qty 2

## 2022-03-14 MED ORDER — ROCURONIUM BROMIDE 10 MG/ML (PF) SYRINGE
PREFILLED_SYRINGE | INTRAVENOUS | Status: DC | PRN
  Administered 2022-03-14: 70 mg via INTRAVENOUS

## 2022-03-14 MED ORDER — MIDAZOLAM HCL 2 MG/2ML IJ SOLN
INTRAMUSCULAR | Status: AC
  Filled 2022-03-14: qty 2

## 2022-03-14 MED ORDER — CEFAZOLIN SODIUM-DEXTROSE 2-4 GM/100ML-% IV SOLN
2.0000 g | INTRAVENOUS | Status: AC
  Administered 2022-03-14: 2 g via INTRAVENOUS
  Filled 2022-03-14: qty 100

## 2022-03-14 MED ORDER — BUPIVACAINE-EPINEPHRINE 0.25% -1:200000 IJ SOLN
INTRAMUSCULAR | Status: DC | PRN
  Administered 2022-03-14: 20 mL

## 2022-03-14 MED ORDER — SUGAMMADEX SODIUM 200 MG/2ML IV SOLN
INTRAVENOUS | Status: DC | PRN
  Administered 2022-03-14: 300 mg via INTRAVENOUS

## 2022-03-14 MED ORDER — OXYCODONE HCL 5 MG PO TABS: 5.0000 mg | ORAL_TABLET | Freq: Four times a day (QID) | ORAL | 0 refills | Status: DC | PRN

## 2022-03-14 MED ORDER — ROCURONIUM BROMIDE 10 MG/ML (PF) SYRINGE
PREFILLED_SYRINGE | INTRAVENOUS | Status: AC
Start: 2022-03-14 — End: ?
  Filled 2022-03-14: qty 10

## 2022-03-14 MED ORDER — ONDANSETRON HCL 4 MG/2ML IJ SOLN
4.0000 mg | Freq: Once | INTRAMUSCULAR | Status: DC | PRN
Start: 2022-03-14 — End: 2022-03-14

## 2022-03-14 MED ORDER — FENTANYL CITRATE (PF) 250 MCG/5ML IJ SOLN
INTRAMUSCULAR | Status: AC
  Filled 2022-03-14: qty 5

## 2022-03-14 MED ORDER — 0.9 % SODIUM CHLORIDE (POUR BTL) OPTIME
TOPICAL | Status: DC | PRN
  Administered 2022-03-14: 1000 mL

## 2022-03-14 MED ORDER — FENTANYL CITRATE (PF) 250 MCG/5ML IJ SOLN
INTRAMUSCULAR | Status: DC | PRN
  Administered 2022-03-14 (×2): 50 ug via INTRAVENOUS
  Administered 2022-03-14: 100 ug via INTRAVENOUS
  Administered 2022-03-14: 50 ug via INTRAVENOUS

## 2022-03-14 MED ORDER — MIDAZOLAM HCL 5 MG/5ML IJ SOLN
INTRAMUSCULAR | Status: DC | PRN
  Administered 2022-03-14: 2 mg via INTRAVENOUS

## 2022-03-14 MED ORDER — EPHEDRINE SULFATE-NACL 50-0.9 MG/10ML-% IV SOSY
PREFILLED_SYRINGE | INTRAVENOUS | Status: DC | PRN
  Administered 2022-03-14: 10 mg via INTRAVENOUS

## 2022-03-14 MED ORDER — OXYCODONE HCL 5 MG PO TABS
5.0000 mg | ORAL_TABLET | Freq: Once | ORAL | Status: AC | PRN
  Administered 2022-03-14: 5 mg via ORAL

## 2022-03-14 MED ORDER — ORAL CARE MOUTH RINSE
15.0000 mL | Freq: Once | OROMUCOSAL | Status: AC
Start: 2022-03-14 — End: 2022-03-14

## 2022-03-14 MED ORDER — KETOROLAC TROMETHAMINE 30 MG/ML IJ SOLN: 30.0000 mg | Freq: Once | INTRAMUSCULAR | Status: DC | PRN

## 2022-03-14 MED ORDER — ONDANSETRON HCL 4 MG/2ML IJ SOLN
4.0000 mg | Freq: Once | INTRAMUSCULAR | Status: AC
  Administered 2022-03-14: 4 mg via INTRAVENOUS
  Filled 2022-03-14: qty 2

## 2022-03-14 MED ORDER — LIDOCAINE 2% (20 MG/ML) 5 ML SYRINGE
INTRAMUSCULAR | Status: DC | PRN
  Administered 2022-03-14: 100 mg via INTRAVENOUS

## 2022-03-14 MED ORDER — ONDANSETRON HCL 4 MG/2ML IJ SOLN
INTRAMUSCULAR | Status: DC | PRN
  Administered 2022-03-14: 4 mg via INTRAVENOUS

## 2022-03-14 MED ORDER — LACTATED RINGERS IV SOLN: INTRAVENOUS | Status: DC

## 2022-03-14 MED ORDER — LACTATED RINGERS IV BOLUS
1000.0000 mL | Freq: Once | INTRAVENOUS | Status: AC
  Administered 2022-03-14: 1000 mL via INTRAVENOUS

## 2022-03-14 MED ORDER — OXYCODONE HCL 5 MG/5ML PO SOLN: 5.0000 mg | Freq: Once | ORAL | Status: AC | PRN

## 2022-03-14 MED ORDER — OXYCODONE HCL 5 MG PO TABS
ORAL_TABLET | ORAL | Status: AC
  Filled 2022-03-14: qty 1

## 2022-03-14 MED ORDER — CHLORHEXIDINE GLUCONATE 0.12 % MT SOLN
15.0000 mL | Freq: Once | OROMUCOSAL | Status: AC
  Administered 2022-03-14: 15 mL via OROMUCOSAL

## 2022-03-14 MED ORDER — DEXAMETHASONE SODIUM PHOSPHATE 10 MG/ML IJ SOLN
INTRAMUSCULAR | Status: AC
  Filled 2022-03-14: qty 1

## 2022-03-14 MED ORDER — HYDROMORPHONE HCL 1 MG/ML IJ SOLN: 0.2500 mg | INTRAMUSCULAR | Status: DC | PRN

## 2022-03-14 MED ORDER — LIDOCAINE HCL (PF) 2 % IJ SOLN
INTRAMUSCULAR | Status: AC
  Filled 2022-03-14: qty 5

## 2022-03-14 MED ORDER — PROPOFOL 10 MG/ML IV BOLUS
INTRAVENOUS | Status: DC | PRN
  Administered 2022-03-14: 140 mg via INTRAVENOUS

## 2022-03-14 MED ORDER — ONDANSETRON HCL 4 MG/2ML IJ SOLN
INTRAMUSCULAR | Status: AC
  Filled 2022-03-14: qty 2

## 2022-03-14 MED ORDER — ACETAMINOPHEN 500 MG PO TABS
1000.0000 mg | ORAL_TABLET | ORAL | Status: DC
  Filled 2022-03-14: qty 2

## 2022-03-14 MED ORDER — PROPOFOL 10 MG/ML IV BOLUS
INTRAVENOUS | Status: AC
Start: 2022-03-14 — End: ?
  Filled 2022-03-14: qty 20

## 2022-03-14 MED ORDER — EPHEDRINE 5 MG/ML INJ
INTRAVENOUS | Status: AC
  Filled 2022-03-14: qty 5

## 2022-03-14 MED ORDER — ENSURE PRE-SURGERY PO LIQD
296.0000 mL | Freq: Once | ORAL | Status: DC
  Filled 2022-03-14: qty 296

## 2022-03-14 MED ORDER — BUPIVACAINE-EPINEPHRINE (PF) 0.25% -1:200000 IJ SOLN
INTRAMUSCULAR | Status: AC
  Filled 2022-03-14: qty 30

## 2022-03-14 SURGICAL SUPPLY — 43 items
ADH SKN CLS APL DERMABOND .7 (GAUZE/BANDAGES/DRESSINGS)
APL PRP STRL LF DISP 70% ISPRP (MISCELLANEOUS) ×1
BAG COUNTER SPONGE SURGICOUNT (BAG) IMPLANT
BAG SPNG CNTER NS LX DISP (BAG)
BINDER ABDOMINAL 12 ML 46-62 (SOFTGOODS) IMPLANT
CABLE HIGH FREQUENCY MONO STRZ (ELECTRODE) IMPLANT
CHLORAPREP W/TINT 26 (MISCELLANEOUS) ×1 IMPLANT
COVER SURGICAL LIGHT HANDLE (MISCELLANEOUS) ×1 IMPLANT
DERMABOND ADVANCED (GAUZE/BANDAGES/DRESSINGS)
DERMABOND ADVANCED .7 DNX12 (GAUZE/BANDAGES/DRESSINGS) IMPLANT
DEVICE SECURE STRAP 25 ABSORB (INSTRUMENTS) IMPLANT
DEVICE TROCAR PUNCTURE CLOSURE (ENDOMECHANICALS) IMPLANT
DRAPE INCISE IOBAN 66X45 STRL (DRAPES) IMPLANT
DRSG TEGADERM 2-3/8X2-3/4 SM (GAUZE/BANDAGES/DRESSINGS) IMPLANT
ELECT REM PT RETURN 15FT ADLT (MISCELLANEOUS) ×1 IMPLANT
GLOVE BIO SURGEON STRL SZ7.5 (GLOVE) ×1 IMPLANT
GLOVE INDICATOR 8.0 STRL GRN (GLOVE) ×1 IMPLANT
GOWN STRL REUS W/ TWL XL LVL3 (GOWN DISPOSABLE) ×3 IMPLANT
GOWN STRL REUS W/TWL XL LVL3 (GOWN DISPOSABLE) ×3
IRRIG SUCT STRYKERFLOW 2 WTIP (MISCELLANEOUS)
IRRIGATION SUCT STRKRFLW 2 WTP (MISCELLANEOUS) IMPLANT
KIT BASIN OR (CUSTOM PROCEDURE TRAY) ×1 IMPLANT
KIT TURNOVER KIT A (KITS) IMPLANT
MARKER SKIN DUAL TIP RULER LAB (MISCELLANEOUS) ×1 IMPLANT
MESH VENTRALEX ST 8CM LRG (Mesh General) IMPLANT
NDL SPNL 22GX3.5 QUINCKE BK (NEEDLE) IMPLANT
NEEDLE SPNL 22GX3.5 QUINCKE BK (NEEDLE) IMPLANT
PENCIL SMOKE EVACUATOR (MISCELLANEOUS) IMPLANT
SCISSORS LAP 5X35 DISP (ENDOMECHANICALS) ×1 IMPLANT
SET TUBE SMOKE EVAC HIGH FLOW (TUBING) ×1 IMPLANT
SHEARS HARMONIC ACE PLUS 36CM (ENDOMECHANICALS) IMPLANT
SLEEVE Z-THREAD 5X100MM (TROCAR) ×1 IMPLANT
SPIKE FLUID TRANSFER (MISCELLANEOUS) ×1 IMPLANT
SPONGE GAUZE 2X2 8PLY STRL LF (GAUZE/BANDAGES/DRESSINGS) IMPLANT
STRIP CLOSURE SKIN 1/2X4 (GAUZE/BANDAGES/DRESSINGS) IMPLANT
SUT MNCRL AB 4-0 PS2 18 (SUTURE) ×1 IMPLANT
SUT NOVA 0 T19/GS 22DT (SUTURE) IMPLANT
SUT NOVA 1 T20/GS 25DT (SUTURE) ×2 IMPLANT
SUT VIC AB 3-0 SH 18 (SUTURE) ×1 IMPLANT
TOWEL OR 17X26 10 PK STRL BLUE (TOWEL DISPOSABLE) ×1 IMPLANT
TRAY LAPAROSCOPIC (CUSTOM PROCEDURE TRAY) ×1 IMPLANT
TROCAR 11X100 Z THREAD (TROCAR) ×1 IMPLANT
TROCAR Z-THREAD OPTICAL 5X100M (TROCAR) ×1 IMPLANT

## 2022-03-14 NOTE — Anesthesia Procedure Notes (Addendum)
Procedure Name: Intubation Date/Time: 03/14/2022 2:04 PM  Performed by: Talbot Grumbling, CRNAPre-anesthesia Checklist: Patient identified, Emergency Drugs available, Suction available and Patient being monitored Patient Re-evaluated:Patient Re-evaluated prior to induction Oxygen Delivery Method: Circle system utilized Preoxygenation: Pre-oxygenation with 100% oxygen Induction Type: IV induction Ventilation: Mask ventilation without difficulty Laryngoscope Size: Mac and 3 Grade View: Grade I Tube type: Oral Tube size: 7.5 mm Number of attempts: 1 Airway Equipment and Method: Stylet Placement Confirmation: ETT inserted through vocal cords under direct vision, positive ETCO2 and breath sounds checked- equal and bilateral Secured at: 22 cm Tube secured with: Tape Dental Injury: Teeth and Oropharynx as per pre-operative assessment

## 2022-03-14 NOTE — Interval H&P Note (Signed)
History and Physical Interval Note:  03/14/2022 1:27 PM  Carlos Montoya  has presented today for surgery, with the diagnosis of UMBILICAL HERNIA.  The various methods of treatment have been discussed with the patient and family. After consideration of risks, benefits and other options for treatment, the patient has consented to  Procedure(s): LAPAROSCOPIC ASSISTED REPAIR UMBILICAL HERNIA WITH MESH (N/A) as a surgical intervention.  The patient's history has been reviewed, patient examined, no change in status, stable for surgery.  I have reviewed the patient's chart and labs.  Questions were answered to the patient's satisfaction.     [name]

## 2022-03-14 NOTE — H&P (Signed)
REFERRING PHYSICIAN: Karl Luke*  PROVIDER: Dajsha Massaro Leanne Chang, MD  MRN: [id number] DOB: 10/28/1960 DATE OF ENCOUNTER: 02/20/2022  Subjective   Chief Complaint: Umbilical Hernia   History of Present Illness: Carlos Montoya is a 61 y.o. male who is seen today as an office consultation at the request of Dr. Ardelia Mems for evaluation of Umbilical Hernia .   He states he has had a known umbilical hernia for many years even before the pandemic. He was supposed to get it repaired but then the pandemic happened. He believes it is increased in size. He denies any nausea vomiting diarrhea or constipation no prior abdominal surgery. He does smoke. He states a 6 pack of cigarettes would last sometimes a day or sometimes up to for 5 days. He states a sixpack of beer will last about every 4 days. Denies any withdrawal or DTs. Denies any tremors. Denies prior blood clots. Denies any chest pain or chest pressure or shortness of breath. He is dealing with some anxiety issues and is seeing a different physician regarding that.    Review of Systems: A complete review of systems was obtained from the patient. I have reviewed this information and discussed as appropriate with the patient. See HPI as well for other ROS.  ROS   Medical History: Past Medical History:  Diagnosis Date  Anxiety  Chronic kidney disease  COPD (chronic obstructive pulmonary disease) (CMS-HCC)  DJD (degenerative joint disease)  ETOH abuse  GERD (gastroesophageal reflux disease)  Hepatitis C  Sleep apnea   Patient Active Problem List  Diagnosis  Tobacco abuse   History reviewed. No pertinent surgical history.   No Known Allergies  Current Outpatient Medications on File Prior to Visit  Medication Sig Dispense Refill  predniSONE (DELTASONE) 10 MG tablet Take 5 tabs tomorrow - day 1, 4 tabs day 2, 3 tabs day 3, 2 tabs day 4, 1 tab on day 5, then stop (Patient not taking: Reported on 02/20/2022) 10 tablet 0   No  current facility-administered medications on file prior to visit.   Family History  Problem Relation Age of Onset  Stroke Mother  Diabetes Mother  Breast cancer Mother  Skin cancer Father  Obesity Father  High blood pressure (Hypertension) Father  Hyperlipidemia (Elevated cholesterol) Father  Coronary Artery Disease (Blocked arteries around heart) Father  Diabetes Father  Deep vein thrombosis (DVT or abnormal blood clot formation) Father  Colon cancer Father    Social History   Tobacco Use  Smoking Status Every Day  Types: Cigarettes  Smokeless Tobacco Not on file    Social History   Socioeconomic History  Marital status: Single  Tobacco Use  Smoking status: Every Day  Types: Cigarettes  Substance and Sexual Activity  Alcohol use: Yes  Drug use: Yes  Comment: Karle Starch   Objective:   Vitals:  02/20/22 1352  BP: (!) 144/88  Pulse: 100  Temp: 37.1 C (98.7 F)  SpO2: 97%  Weight: 68 kg (150 lb)  Height: 180.3 cm (5\' 11" )   Body mass index is 20.92 kg/m.  Constitutional: NAD; conversant; no deformities Eyes: Moist conjunctiva; no lid lag; anicteric; PERRL Neck: Trachea midline; no thyromegaly Lungs: Normal respiratory effort; no tactile fremitus CV: RRR; no palpable thrills; no pitting edema GI: Abd soft, nontender, nondistended. Has an obvious umbilical hernia. It is mostly reducible. I think he may have a small Swiss cheese defect around the umbilicus. There is definitely 1 defect that is about 1 cm and may be  a separate defect at the 4 o'clock position that is maybe half a centimeter. Has some upper midline diastases as well; no palpable hepatosplenomegaly MSK: Normal gait; no clubbing/cyanosis Psychiatric: Appropriate affect; alert and oriented x3; a little bit of pressured speech at times Lymphatic: No palpable cervical or axillary lymphadenopathy Skin: No rash, lesions or jaundice  Labs, Imaging and Diagnostic Testing: PCP office note Nov 06, 2021  Reviewed lipid panel, comprehensive metabolic panel and PSA from May 3  CT abdomen and pelvis June 24, 2017 INDINGS:   CHEST:  ARIWAYS/LUNGS/PLEURA:Clear central tracheobronchial tree. No lung consolidation. Bilateral dependent atelectasis. No pleural effusion or pneumothorax.  MEDIASTINUM: Heart is normal in size. No pericardial effusion.  VESSELS: No traumatic aortic injury.  LYMPH NODES: No adenopathy.   ABDOMEN/PELVIS:  HEPATOBILIARY: Unremarkable liver. No biliary ductal dilatation.  GALLBLADDER: Unremarkable.  SPLEEN: Unremarkable.  PANCREAS: Unremarkable.  ADRENAL GLANDS: Unremarkable.  KIDNEYS/URETERS: Symmetric nephrograms. No hydronephrosis. No nephrolithiasis. Subcentimeter left lower pole hypodensity, too small to characterize.  BLADDER: Unremarkable.  REPRODUCTIVE: Unremarkable.  BOWEL/PERITONEUM: No bowel obstruction. No acute inflammatory process. Unremarkable appendix. No ascites.  VASCULATURE: Aorta and major branch vessels are patent.  LYMPH NODES: No adenopathy.   BONES:No acute osseous abnormality.  SOFT TISSUES: Small fat-containing umbilical hernia.   Assessment and Plan:    Diagnoses and all orders for this visit:  Umbilical hernia without obstruction and without gangrene  Tobacco abuse    We discussed the etiology of ventral umbilical hernias. We discussed the signs and symptoms of incarceration and strangulation. The patient was given educational material. I also drew diagrams.  We discussed nonoperative and operative management. With respect to operative management, we discussed laparoscopic assisted (primary muscle repair with mesh underlay). We discussed the pros and cons of each approach. I discussed the typical aftercare with each procedure and how each procedure differs.  At first the patient asked that he have no mesh repair. I told him that we could certainly do that but he would be at increased risk for recurrence since the size  of his hernia is probably larger than a quarter when the entire defect is put together and along with his tobacco abuse he would be at increased risk for recurrence. We also discussed that his tobacco use slightly increases his risk for recurrence anyway along with mesh complications. The patient feels that his hernia is getting bigger and is starting to bother him more frequently.  After much discussion he is okay with proceeding with a mesh repair  We discussed the risk and benefits of surgery including but not limited to bleeding, infection, injury to surrounding structures, hernia recurrence, mesh complications, hematoma/seroma formation, need to convert to an open procedure, blood clot formation, urinary retention, post operative ileus, general anesthesia risk, long-term abdominal pain. We discussed that this procedure can be quite uncomfortable and difficult to recover from based on how the mesh is secured to the abdominal wall. We discussed the importance of avoiding heavy lifting and straining for a period of 6 weeks. We discussed postoperative pain control with nonopioid analgesia. We discussed the typical recovery.  The patient has elected to laparoscopic assisted repair of umbilical hernia with mesh  There is a questionable history of alcohol abuse. He has normal platelet counts and a normal CBC and normal LFTs in the most recent labs. On prior imaging studies from a few years ago no evidence of ascites or varices.  This patient encounter took 45 minutes today to perform the following: take  history, perform exam, review outside records, interpret imaging, counsel the patient on their diagnosis and document encounter, findings & plan in the EHR  No follow-ups on file.  Maeleigh Buschman Sherril Cong, MD  General, Minimally Invasive, & Bariatric Surgery     Electronically signed by Gara Kroner, MD at 02/20/2022 2:38 PM EDT

## 2022-03-14 NOTE — ED Notes (Signed)
Pt placed on 2L [place] for sats 89-90 % on RA, pt fingers cold, pt every day smoker, intermittent bronchitis per pt.

## 2022-03-14 NOTE — Op Note (Addendum)
Carlos Montoya [id number] 11/01/1960 03/14/2022   Laparoscopic Assisted Repair of Incarcerated Umbilical Hernia with Mesh Procedure Note  Indications: Symptomatic incarcerated umbilical hernia  Pre-operative Diagnosis: incarcerated umbilical hernia  Post-operative Diagnosis: incarcerated umbilical hernias (incarcerated with preperitoneal fat, defect measured 3cm)  Surgeon: [name] MD FACS  Assistants: none  Anesthesia: General endotracheal anesthesia   Procedure Details  The patient was seen in the Holding Room. The risks, benefits, complications, treatment options, and expected outcomes were discussed with the patient. The possibilities of reaction to medication, pulmonary aspiration, perforation of viscus, bleeding, recurrent infection, the need for additional procedures, failure to diagnose a condition, and creating a complication requiring transfusion or operation were discussed with the patient. The patient concurred with the proposed plan, giving informed consent.  The site of surgery properly noted/marked. The patient was taken to the operating room, identified as Carlos Montoya  and the procedure verified as laparoscopic assisted repair of incarcerated umbilical hernia with mesh. A Time Out was held and the above information confirmed.    The patient was placed supine.  After establishing general anesthesia,  Left arm tucked with the appropriate padding.  The abdomen was prepped with Chloraprep and draped in standard fashion.  A 5 mm Optiview was used the cannulate the peritoneal cavity in the left upper quadrant below the costal margin.  Pneumoperitoneum was obtained by insufflating CO2, maintaining a maximum pressure of 15 mmHg.  The 5 mm 30-degree laparoscopic was inserted.  There were no significant omental adhesions to the anterior abdominal wall in and around the umbilical hernia defect.   Another 5-mm port was placed in the left lateral abdominal wall.  There  was a plug of preperitoneal fat incarcerated into the umbilical hernia.  Using traction with an atraumatic grasper I was able to reduce the incarcerated plug of preperitoneal fat that was emanating from the falciform ligament.   I then took down the falciform ligament with Endo Shears with electrocautery.   We visualized 2 fascial defects at the umbilical fascia.    I then proceeded with the open portion of the procedure.  A curvilinear infraumbilical incision was created. Dissection was carried down to the hernia sac located above the fascia and was mobilized from surrounding structures. Intact fascia was identified circumferentially around the defect.  2 fascial defects separated by about 2 mm of intact fascia. (First defect was about 0.5cm caudal to a slightly larger one of 2cm). I ended up dividing the intact fascia to create 1 larger defect.  The hernia sac and incarcerated preperitoneal fat were excised and discarded.  Skin and soft tissue was mobilized from the surface of the fascia in a circumferential manner. A finger sweep was performed underneath the fascia to ensure there were no adhesions to the anterior abdominal wall.  I then measured the defect.  The hernia defect measured 3 cm.  With adding a 5 cm overlap circumferentially this gave [place] a mesh requirement of around 8 cm.  Therefore I selected a round piece of Bard ventralex hernia patch measuring 8.4 cm We placed 1 central stay suture of 1-0 Novofil in the center of the mesh.  The mesh was then rolled up and inserted through the fascial defect. The fascia was then closed primarily over the mesh with 5 interrupted 1-0-novafil sutures.   I then returned laparoscopically.  I then infiltrated local in a regional fashion in the preperitoneal space around the umbilical fascial closure and around the expected locations of where the  tacks would be placed.  The mesh was then unrolled.  The central stay suture was then pulled up above and below the  primary umbilical closure.  Using the [facility] device.  This deployed the mesh widely over the closed fascial defects.  The stay suture was then tied down.  The Secure Strap device was then used to tack down the edges of the mesh at 1 cm intervals circumferentially. We placed a few tacks inside the outer ring of tacks.  We inspected for hemostasis.   Pneumoperitoneum was then released as we removed the remainder of the trocars.   The umbilical cavity was irrigated. The umbilical stalk was then tacked back down to the fascia with one 3-0 vicryl suture.  The port sites and umbilical incision were closed with 4-0 Monocryl.  All of the incisions and stay suture sites were then covered, steri-strips and bandages.   The patient was extubated and brought to the recovery room in stable condition.  All sponge, instrument, and needle counts were correct prior to closure and at the conclusion of the case.   Findings: Type of repair - primary suture with mesh underlay Name of mesh - Bard Ventralex hernia patch  Size of mesh - Round 8.4 cm  Mesh overlap - >5 cm  Placement of mesh - beneath fascia and into peritoneal cavity,  Estimated Blood Loss:  Minimal         Complications:  None; patient tolerated the procedure well.         Disposition: PACU - hemodynamically stable.         Condition: stable

## 2022-03-14 NOTE — Anesthesia Preprocedure Evaluation (Signed)
Anesthesia Evaluation  Patient identified by MRN, date of birth, ID band Patient awake    Reviewed: Allergy & Precautions, NPO status , Patient's Chart, lab work & pertinent test results  Airway Mallampati: II  TM Distance: >3 FB Neck ROM: Full    Dental no notable dental hx.    Pulmonary asthma , COPD,  COPD inhaler, Current SmokerPatient did not abstain from smoking.,    Pulmonary exam normal breath sounds clear to auscultation       Cardiovascular negative cardio ROS Normal cardiovascular exam Rhythm:Regular Rate:Normal     Neuro/Psych negative neurological ROS  negative psych ROS   GI/Hepatic GERD  ,(+)     substance abuse  ,   Endo/Other  negative endocrine ROS  Renal/GU negative Renal ROS  negative genitourinary   Musculoskeletal negative musculoskeletal ROS (+)   Abdominal   Peds negative pediatric ROS (+)  Hematology negative hematology ROS (+)   Anesthesia Other Findings   Reproductive/Obstetrics negative OB ROS                             Anesthesia Physical Anesthesia Plan  ASA: 3  Anesthesia Plan: General   Post-op Pain Management: Tylenol PO (pre-op)*   Induction: Intravenous  PONV Risk Score and Plan: 1 and Ondansetron, Dexamethasone and Treatment may vary due to age or medical condition  Airway Management Planned: Oral ETT  Additional Equipment:   Intra-op Plan:   Post-operative Plan: Extubation in OR  Informed Consent: I have reviewed the patients History and Physical, chart, labs and discussed the procedure including the risks, benefits and alternatives for the proposed anesthesia with the patient or authorized representative who has indicated his/her understanding and acceptance.     Dental advisory given  Plan Discussed with: CRNA and Surgeon  Anesthesia Plan Comments:         Anesthesia Quick Evaluation

## 2022-03-14 NOTE — Anesthesia Procedure Notes (Signed)
Date/Time: 03/14/2022 3:12 PM  Performed by: Minerva Ends, CRNAOxygen Delivery Method: Simple face mask Placement Confirmation: positive ETCO2 and breath sounds checked- equal and bilateral Dental Injury: Teeth and Oropharynx as per pre-operative assessment

## 2022-03-14 NOTE — Anesthesia Postprocedure Evaluation (Signed)
Anesthesia Post Note  Patient: Carlos Montoya  Procedure(s) Performed: LAPAROSCOPIC ASSISTED REPAIR UMBILICAL HERNIA WITH MESH (Abdomen)     Patient location during evaluation: PACU Anesthesia Type: General Level of consciousness: awake and alert Pain management: pain level controlled Vital Signs Assessment: post-procedure vital signs reviewed and stable Respiratory status: spontaneous breathing, nonlabored ventilation, respiratory function stable and patient connected to nasal cannula oxygen Cardiovascular status: blood pressure returned to baseline and stable Postop Assessment: no apparent nausea or vomiting Anesthetic complications: no   No notable events documented.  Last Vitals:  Vitals:   03/14/22 1530 03/14/22 1545  BP: 139/89 (!) 144/86  Pulse: 75 74  Resp: 13 16  Temp:    SpO2: 100% 90%    Last Pain:  Vitals:   03/14/22 1545  TempSrc:   PainSc: 0-No pain                 Alonda Weaber S

## 2022-03-14 NOTE — ED Triage Notes (Addendum)
Pt BIB [facility] d/t increase pain to lower abd with pain radiating to RLQ. Pt has umbilical hernia sx earlier today and has not been able to get pain under control. Pt was sent home with oxycodone. Afebrile, VSS, A&O x4. Pt reports "1 beer when I got home"

## 2022-03-14 NOTE — Transfer of Care (Signed)
Immediate Anesthesia Transfer of Care Note  Patient: Carlos Montoya  Procedure(s) Performed: LAPAROSCOPIC ASSISTED REPAIR UMBILICAL HERNIA WITH MESH (Abdomen)  Patient Location: PACU  Anesthesia Type:General  Level of Consciousness: awake and alert   Airway & Oxygen Therapy: Patient Spontanous Breathing and Patient connected to face mask oxygen  Post-op Assessment: Report given to RN and Post -op Vital signs reviewed and stable  Post vital signs: Reviewed and stable  Last Vitals:  Vitals Value Taken Time  BP 150/86 03/14/22 1520  Temp 36.5 C 03/14/22 1520  Pulse 83 03/14/22 1523  Resp 13 03/14/22 1523  SpO2 100 % 03/14/22 1523  Vitals shown include unvalidated device data.  Last Pain:  Vitals:   03/14/22 1103  TempSrc: Oral         Complications: No notable events documented.

## 2022-03-14 NOTE — Discharge Instructions (Signed)
[facility]  UMBILICAL OR INGUINAL HERNIA REPAIR: POST OP INSTRUCTIONS  Always review your discharge instruction sheet given to you by the facility where your surgery was performed. IF YOU HAVE DISABILITY OR FAMILY LEAVE FORMS, YOU MUST BRING THEM TO THE OFFICE FOR PROCESSING.   DO NOT GIVE THEM TO YOUR DOCTOR.  A  prescription for pain medication may be given to you upon discharge.  Take your pain medication as prescribed, if needed.  If narcotic pain medicine is not needed, then you may take acetaminophen (Tylenol) or ibuprofen (Advil) as needed. Take your usually prescribed medications unless otherwise directed. If you need a refill on your pain medication, please contact your pharmacy.  They will contact our office to request authorization. Prescriptions will not be filled after 5 pm or on week-ends. You should follow a light diet the first 24 hours after arrival home, such as soup and crackers, etc.  Be sure to include lots of fluids daily.  Resume your normal diet the day after surgery. Most patients will experience some swelling and bruising around the umbilicus or in the groin and scrotum.  Ice packs and reclining will help.  Swelling and bruising can take several days to resolve.  It is common to experience some constipation if taking pain medication after surgery.  Increasing fluid intake and taking a stool softener (such as Colace) will usually help or prevent this problem from occurring.  A mild laxative (Milk of Magnesia or Miralax) should be taken according to package directions if there are no bowel movements after 48 hours. Unless discharge instructions indicate otherwise, you may remove your bandages 24-48 hours after surgery, and you may shower at that time.  You may have steri-strips (small skin tapes) in place directly over the incision.  These strips should be left on the skin for 7-10 days.  If your surgeon used skin glue on the incision, you may shower in 24  hours.  The glue will flake off over the next 2-3 weeks.  Any sutures or staples will be removed at the office during your follow-up visit. ACTIVITIES:  You may resume regular (light) daily activities beginning the next day--such as daily self-care, walking, climbing stairs--gradually increasing activities as tolerated.  You may have sexual intercourse when it is comfortable.  Refrain from any heavy lifting or straining until approved by your doctor. You may drive when you are no longer taking prescription pain medication, you can comfortably wear a seatbelt, and you can safely maneuver your car and apply brakes. RETURN TO WORK:  You should see your doctor in the office for a follow-up appointment approximately 2-3 weeks after your surgery.  Make sure that you call for this appointment within a day or two after you arrive home to insure a convenient appointment time. OTHER INSTRUCTIONS:     WHEN TO CALL YOUR DOCTOR: Fever over 101.0 Inability to urinate Nausea and/or vomiting Extreme swelling or bruising Continued bleeding from incision. Increased pain, redness, or drainage from the incision  The clinic staff is available to answer your questions during regular business hours.  Please don't hesitate to call and ask to speak to one of the nurses for clinical concerns.  If you have a medical emergency, go to the nearest emergency room or call 911.  A surgeon from [facility] is always on call at the hospital   [address], [address], [place], [place]  [address] ?  [address] [address], [place], [place]     [address] [phone] ? [phone] ? FAX [phone] Web site: [email or web address]  

## 2022-03-15 ENCOUNTER — Encounter (HOSPITAL_COMMUNITY): Payer: Self-pay

## 2022-03-15 ENCOUNTER — Emergency Department (HOSPITAL_COMMUNITY): Payer: Medicaid Other

## 2022-03-15 DIAGNOSIS — G8918 Other acute postprocedural pain: Secondary | ICD-10-CM | POA: Diagnosis not present

## 2022-03-15 DIAGNOSIS — Z8719 Personal history of other diseases of the digestive system: Secondary | ICD-10-CM

## 2022-03-15 DIAGNOSIS — Z8249 Family history of ischemic heart disease and other diseases of the circulatory system: Secondary | ICD-10-CM | POA: Diagnosis not present

## 2022-03-15 DIAGNOSIS — F17210 Nicotine dependence, cigarettes, uncomplicated: Secondary | ICD-10-CM | POA: Diagnosis present

## 2022-03-15 DIAGNOSIS — F319 Bipolar disorder, unspecified: Secondary | ICD-10-CM | POA: Diagnosis present

## 2022-03-15 DIAGNOSIS — J449 Chronic obstructive pulmonary disease, unspecified: Secondary | ICD-10-CM | POA: Diagnosis present

## 2022-03-15 DIAGNOSIS — Z888 Allergy status to other drugs, medicaments and biological substances status: Secondary | ICD-10-CM | POA: Diagnosis not present

## 2022-03-15 DIAGNOSIS — Z886 Allergy status to analgesic agent status: Secondary | ICD-10-CM | POA: Diagnosis not present

## 2022-03-15 DIAGNOSIS — G894 Chronic pain syndrome: Secondary | ICD-10-CM | POA: Diagnosis present

## 2022-03-15 DIAGNOSIS — Z79899 Other long term (current) drug therapy: Secondary | ICD-10-CM | POA: Diagnosis not present

## 2022-03-15 DIAGNOSIS — Z833 Family history of diabetes mellitus: Secondary | ICD-10-CM | POA: Diagnosis not present

## 2022-03-15 DIAGNOSIS — Z7982 Long term (current) use of aspirin: Secondary | ICD-10-CM | POA: Diagnosis not present

## 2022-03-15 DIAGNOSIS — K429 Umbilical hernia without obstruction or gangrene: Secondary | ICD-10-CM | POA: Diagnosis present

## 2022-03-15 DIAGNOSIS — F411 Generalized anxiety disorder: Secondary | ICD-10-CM | POA: Diagnosis present

## 2022-03-15 DIAGNOSIS — K219 Gastro-esophageal reflux disease without esophagitis: Secondary | ICD-10-CM | POA: Diagnosis present

## 2022-03-15 DIAGNOSIS — Z800 Family history of malignant neoplasm of digestive organs: Secondary | ICD-10-CM | POA: Diagnosis not present

## 2022-03-15 LAB — CBC WITH DIFFERENTIAL/PLATELET
Abs Immature Granulocytes: 0.27 10*3/uL — ABNORMAL HIGH (ref 0.00–0.07)
Basophils Absolute: 0 10*3/uL (ref 0.0–0.1)
Basophils Relative: 0 %
Eosinophils Absolute: 0.1 10*3/uL (ref 0.0–0.5)
Eosinophils Relative: 0 %
HCT: 46 % (ref 39.0–52.0)
Hemoglobin: 15.7 g/dL (ref 13.0–17.0)
Immature Granulocytes: 2 %
Lymphocytes Relative: 9 %
Lymphs Abs: 1.3 10*3/uL (ref 0.7–4.0)
MCH: 34.3 pg — ABNORMAL HIGH (ref 26.0–34.0)
MCHC: 34.1 g/dL (ref 30.0–36.0)
MCV: 100.4 fL — ABNORMAL HIGH (ref 80.0–100.0)
Monocytes Absolute: 0.8 10*3/uL (ref 0.1–1.0)
Monocytes Relative: 5 %
Neutro Abs: 12.8 10*3/uL — ABNORMAL HIGH (ref 1.7–7.7)
Neutrophils Relative %: 84 %
Platelets: 268 10*3/uL (ref 150–400)
RBC: 4.58 MIL/uL (ref 4.22–5.81)
RDW: 12.7 % (ref 11.5–15.5)
WBC: 15.2 10*3/uL — ABNORMAL HIGH (ref 4.0–10.5)
nRBC: 0 % (ref 0.0–0.2)

## 2022-03-15 LAB — LACTIC ACID, PLASMA: Lactic Acid, Venous: 1.2 mmol/L (ref 0.5–1.9)

## 2022-03-15 LAB — BASIC METABOLIC PANEL
Anion gap: 6 (ref 5–15)
BUN: 7 mg/dL (ref 6–20)
CO2: 27 mmol/L (ref 22–32)
Calcium: 8.8 mg/dL — ABNORMAL LOW (ref 8.9–10.3)
Chloride: 105 mmol/L (ref 98–111)
Creatinine, Ser: 0.94 mg/dL (ref 0.61–1.24)
GFR, Estimated: >60 mL/min (ref >60)
Glucose, Bld: 100 mg/dL — ABNORMAL HIGH (ref 70–99)
Potassium: 3.8 mmol/L (ref 3.5–5.1)
Sodium: 138 mmol/L (ref 135–145)

## 2022-03-15 LAB — ETHANOL: Alcohol, Ethyl (B): <10 mg/dL (ref <10)

## 2022-03-15 LAB — COMPREHENSIVE METABOLIC PANEL
ALT: 33 U/L (ref 0–44)
AST: 43 U/L — ABNORMAL HIGH (ref 15–41)
Albumin: 4.3 g/dL (ref 3.5–5.0)
Alkaline Phosphatase: 68 U/L (ref 38–126)
Anion gap: 9 (ref 5–15)
BUN: 7 mg/dL (ref 6–20)
CO2: 27 mmol/L (ref 22–32)
Calcium: 9.6 mg/dL (ref 8.9–10.3)
Chloride: 103 mmol/L (ref 98–111)
Creatinine, Ser: 0.99 mg/dL (ref 0.61–1.24)
GFR, Estimated: >60 mL/min (ref >60)
Glucose, Bld: 119 mg/dL — ABNORMAL HIGH (ref 70–99)
Potassium: 4.1 mmol/L (ref 3.5–5.1)
Sodium: 139 mmol/L (ref 135–145)
Total Bilirubin: 0.9 mg/dL (ref 0.3–1.2)
Total Protein: 7.8 g/dL (ref 6.5–8.1)

## 2022-03-15 LAB — MAGNESIUM: Magnesium: 1.4 mg/dL — ABNORMAL LOW (ref 1.7–2.4)

## 2022-03-15 LAB — APTT: aPTT: 27 seconds (ref 24–36)

## 2022-03-15 LAB — CBC
HCT: 44.4 % (ref 39.0–52.0)
Hemoglobin: 15.1 g/dL (ref 13.0–17.0)
MCH: 34.5 pg — ABNORMAL HIGH (ref 26.0–34.0)
MCHC: 34 g/dL (ref 30.0–36.0)
MCV: 101.4 fL — ABNORMAL HIGH (ref 80.0–100.0)
Platelets: 228 10*3/uL (ref 150–400)
RBC: 4.38 MIL/uL (ref 4.22–5.81)
RDW: 12.9 % (ref 11.5–15.5)
WBC: 13.3 10*3/uL — ABNORMAL HIGH (ref 4.0–10.5)
nRBC: 0 % (ref 0.0–0.2)

## 2022-03-15 LAB — PROTIME-INR
INR: 1.1 (ref 0.8–1.2)
Prothrombin Time: 13.9 seconds (ref 11.4–15.2)

## 2022-03-15 LAB — PHOSPHORUS: Phosphorus: 3.7 mg/dL (ref 2.5–4.6)

## 2022-03-15 MED ORDER — PROCHLORPERAZINE MALEATE 10 MG PO TABS: 10.0000 mg | ORAL_TABLET | Freq: Four times a day (QID) | ORAL | Status: DC | PRN

## 2022-03-15 MED ORDER — METHOCARBAMOL 500 MG PO TABS: 500.0000 mg | ORAL_TABLET | Freq: Four times a day (QID) | ORAL | Status: DC | PRN

## 2022-03-15 MED ORDER — PANTOPRAZOLE SODIUM 40 MG PO TBEC
40.0000 mg | DELAYED_RELEASE_TABLET | Freq: Every day | ORAL | Status: DC
  Administered 2022-03-15 – 2022-03-17 (×3): 40 mg via ORAL
  Filled 2022-03-15 (×3): qty 1

## 2022-03-15 MED ORDER — ALUM & MAG HYDROXIDE-SIMETH 200-200-20 MG/5ML PO SUSP: 30.0000 mL | Freq: Four times a day (QID) | ORAL | Status: DC | PRN

## 2022-03-15 MED ORDER — SODIUM CHLORIDE 0.9% FLUSH: 3.0000 mL | Freq: Two times a day (BID) | INTRAVENOUS | Status: DC

## 2022-03-15 MED ORDER — ALBUTEROL SULFATE (2.5 MG/3ML) 0.083% IN NEBU: 2.5000 mg | INHALATION_SOLUTION | RESPIRATORY_TRACT | Status: DC | PRN

## 2022-03-15 MED ORDER — KCL-LACTATED RINGERS-D5W 20 MEQ/L IV SOLN
INTRAVENOUS | Status: DC
  Filled 2022-03-15: qty 1000

## 2022-03-15 MED ORDER — HYDROMORPHONE HCL 1 MG/ML IJ SOLN
0.5000 mg | INTRAMUSCULAR | Status: DC | PRN
  Administered 2022-03-15 – 2022-03-16 (×4): 1 mg via INTRAVENOUS
  Filled 2022-03-15 (×4): qty 1

## 2022-03-15 MED ORDER — CALCIUM POLYCARBOPHIL 625 MG PO TABS
625.0000 mg | ORAL_TABLET | Freq: Two times a day (BID) | ORAL | Status: DC
  Administered 2022-03-15 – 2022-03-17 (×4): 625 mg via ORAL
  Filled 2022-03-15 (×4): qty 1

## 2022-03-15 MED ORDER — SIMETHICONE 40 MG/0.6ML PO SUSP: 80.0000 mg | Freq: Four times a day (QID) | ORAL | Status: DC | PRN

## 2022-03-15 MED ORDER — FLUTICASONE PROPIONATE HFA 44 MCG/ACT IN AERO
2.0000 | INHALATION_SPRAY | Freq: Two times a day (BID) | RESPIRATORY_TRACT | Status: DC
Start: 2022-03-15 — End: 2022-03-15

## 2022-03-15 MED ORDER — LIP MEDEX EX OINT
TOPICAL_OINTMENT | Freq: Two times a day (BID) | CUTANEOUS | Status: DC
  Administered 2022-03-16: 1 via TOPICAL
  Administered 2022-03-17: 75 via TOPICAL
  Filled 2022-03-15 (×2): qty 7

## 2022-03-15 MED ORDER — GABAPENTIN 100 MG PO CAPS
200.0000 mg | ORAL_CAPSULE | Freq: Three times a day (TID) | ORAL | Status: DC
  Administered 2022-03-15 (×3): 200 mg via ORAL
  Filled 2022-03-15 (×3): qty 2

## 2022-03-15 MED ORDER — SODIUM CHLORIDE 0.9% FLUSH: 3.0000 mL | INTRAVENOUS | Status: DC | PRN

## 2022-03-15 MED ORDER — PROCHLORPERAZINE EDISYLATE 10 MG/2ML IJ SOLN: 5.0000 mg | Freq: Four times a day (QID) | INTRAMUSCULAR | Status: DC | PRN

## 2022-03-15 MED ORDER — ACETAMINOPHEN 325 MG PO TABS
325.0000 mg | ORAL_TABLET | Freq: Four times a day (QID) | ORAL | Status: DC | PRN
  Filled 2022-03-15: qty 2

## 2022-03-15 MED ORDER — BISACODYL 5 MG PO TBEC
5.0000 mg | DELAYED_RELEASE_TABLET | Freq: Every day | ORAL | Status: DC | PRN
  Administered 2022-03-16: 5 mg via ORAL
  Filled 2022-03-15: qty 1

## 2022-03-15 MED ORDER — MENTHOL 3 MG MT LOZG: 1.0000 | LOZENGE | OROMUCOSAL | Status: DC | PRN

## 2022-03-15 MED ORDER — SIMETHICONE 80 MG PO CHEW: 40.0000 mg | CHEWABLE_TABLET | Freq: Four times a day (QID) | ORAL | Status: DC | PRN

## 2022-03-15 MED ORDER — SENNOSIDES-DOCUSATE SODIUM 8.6-50 MG PO TABS: 1.0000 | ORAL_TABLET | Freq: Every evening | ORAL | Status: DC | PRN

## 2022-03-15 MED ORDER — ENOXAPARIN SODIUM 40 MG/0.4ML IJ SOSY
40.0000 mg | PREFILLED_SYRINGE | INTRAMUSCULAR | Status: DC
  Administered 2022-03-16 – 2022-03-17 (×2): 40 mg via SUBCUTANEOUS
  Filled 2022-03-15 (×2): qty 0.4

## 2022-03-15 MED ORDER — MELATONIN 3 MG PO TABS
3.0000 mg | ORAL_TABLET | Freq: Every evening | ORAL | Status: DC | PRN
  Administered 2022-03-16: 3 mg via ORAL
  Filled 2022-03-15: qty 1

## 2022-03-15 MED ORDER — METOPROLOL TARTRATE 5 MG/5ML IV SOLN: 5.0000 mg | Freq: Four times a day (QID) | INTRAVENOUS | Status: DC | PRN

## 2022-03-15 MED ORDER — OXYCODONE HCL 5 MG PO TABS
5.0000 mg | ORAL_TABLET | ORAL | Status: DC | PRN
  Administered 2022-03-15 – 2022-03-17 (×4): 10 mg via ORAL
  Filled 2022-03-15 (×5): qty 2

## 2022-03-15 MED ORDER — BUDESONIDE 0.25 MG/2ML IN SUSP
0.2500 mg | Freq: Two times a day (BID) | RESPIRATORY_TRACT | Status: DC
Start: 2022-03-15 — End: 2022-03-17
  Administered 2022-03-16 – 2022-03-17 (×3): 0.25 mg via RESPIRATORY_TRACT
  Filled 2022-03-15 (×3): qty 2

## 2022-03-15 MED ORDER — ONDANSETRON 4 MG PO TBDP: 4.0000 mg | ORAL_TABLET | Freq: Four times a day (QID) | ORAL | Status: DC | PRN

## 2022-03-15 MED ORDER — SODIUM CHLORIDE 0.9 % IV SOLN: 250.0000 mL | INTRAVENOUS | Status: DC | PRN

## 2022-03-15 MED ORDER — IBUPROFEN 200 MG PO TABS: 400.0000 mg | ORAL_TABLET | Freq: Four times a day (QID) | ORAL | Status: DC | PRN

## 2022-03-15 MED ORDER — METHOCARBAMOL 1000 MG/10ML IJ SOLN: 1000.0000 mg | Freq: Four times a day (QID) | INTRAVENOUS | Status: DC | PRN

## 2022-03-15 MED ORDER — IOHEXOL 300 MG/ML  SOLN
100.0000 mL | Freq: Once | INTRAMUSCULAR | Status: AC | PRN
  Administered 2022-03-15: 100 mL via INTRAVENOUS

## 2022-03-15 MED ORDER — LORAZEPAM 2 MG/ML IJ SOLN: 0.5000 mg | Freq: Three times a day (TID) | INTRAMUSCULAR | Status: DC | PRN

## 2022-03-15 MED ORDER — SODIUM CHLORIDE 0.9% FLUSH
3.0000 mL | Freq: Two times a day (BID) | INTRAVENOUS | Status: DC
  Administered 2022-03-15 – 2022-03-17 (×4): 3 mL via INTRAVENOUS

## 2022-03-15 MED ORDER — MIRTAZAPINE 15 MG PO TABS
15.0000 mg | ORAL_TABLET | Freq: Every day | ORAL | Status: DC
  Administered 2022-03-15 – 2022-03-16 (×2): 15 mg via ORAL
  Filled 2022-03-15 (×2): qty 1

## 2022-03-15 MED ORDER — ACETAMINOPHEN 650 MG RE SUPP: 650.0000 mg | Freq: Four times a day (QID) | RECTAL | Status: DC | PRN

## 2022-03-15 MED ORDER — SENNA 8.6 MG PO TABS
1.0000 | ORAL_TABLET | Freq: Two times a day (BID) | ORAL | Status: DC
  Administered 2022-03-15 – 2022-03-17 (×5): 8.6 mg via ORAL
  Filled 2022-03-15 (×5): qty 1

## 2022-03-15 MED ORDER — FENTANYL CITRATE PF 50 MCG/ML IJ SOSY
100.0000 ug | PREFILLED_SYRINGE | Freq: Once | INTRAMUSCULAR | Status: AC
  Administered 2022-03-15: 100 ug via INTRAVENOUS
  Filled 2022-03-15: qty 2

## 2022-03-15 MED ORDER — GABAPENTIN 300 MG PO CAPS
300.0000 mg | ORAL_CAPSULE | Freq: Two times a day (BID) | ORAL | Status: DC
  Administered 2022-03-15: 300 mg via ORAL
  Filled 2022-03-15: qty 1

## 2022-03-15 MED ORDER — PHENOL 1.4 % MT LIQD: 2.0000 | OROMUCOSAL | Status: DC | PRN

## 2022-03-15 MED ORDER — METHOCARBAMOL 500 MG PO TABS: 1000.0000 mg | ORAL_TABLET | Freq: Four times a day (QID) | ORAL | Status: DC | PRN

## 2022-03-15 MED ORDER — BISACODYL 10 MG RE SUPP: 10.0000 mg | Freq: Two times a day (BID) | RECTAL | Status: DC | PRN

## 2022-03-15 MED ORDER — SODIUM CHLORIDE 0.9 % IV SOLN: INTRAVENOUS | Status: DC | PRN

## 2022-03-15 MED ORDER — PANTOPRAZOLE SODIUM 40 MG IV SOLR
40.0000 mg | Freq: Once | INTRAVENOUS | Status: AC
Start: 2022-03-15 — End: 2022-03-15
  Administered 2022-03-15: 40 mg via INTRAVENOUS
  Filled 2022-03-15: qty 10

## 2022-03-15 MED ORDER — DIPHENHYDRAMINE HCL 50 MG/ML IJ SOLN: 12.5000 mg | Freq: Four times a day (QID) | INTRAMUSCULAR | Status: DC | PRN

## 2022-03-15 MED ORDER — LACTATED RINGERS IV BOLUS: 1000.0000 mL | Freq: Three times a day (TID) | INTRAVENOUS | Status: DC | PRN

## 2022-03-15 MED ORDER — POLYETHYLENE GLYCOL 3350 17 G PO PACK: 17.0000 g | PACK | Freq: Two times a day (BID) | ORAL | Status: DC | PRN

## 2022-03-15 MED ORDER — FENTANYL CITRATE PF 50 MCG/ML IJ SOSY
50.0000 ug | PREFILLED_SYRINGE | Freq: Once | INTRAMUSCULAR | Status: AC
  Administered 2022-03-15: 50 ug via INTRAVENOUS
  Filled 2022-03-15: qty 1

## 2022-03-15 MED ORDER — MAGIC MOUTHWASH: 15.0000 mL | Freq: Four times a day (QID) | ORAL | Status: DC | PRN

## 2022-03-15 MED ORDER — LORATADINE 10 MG PO TABS
10.0000 mg | ORAL_TABLET | Freq: Every day | ORAL | Status: DC
  Administered 2022-03-15 – 2022-03-17 (×3): 10 mg via ORAL
  Filled 2022-03-15 (×3): qty 1

## 2022-03-15 MED ORDER — HYDROMORPHONE HCL 1 MG/ML IJ SOLN
0.5000 mg | INTRAMUSCULAR | Status: DC | PRN
  Administered 2022-03-15: 1 mg via INTRAVENOUS
  Filled 2022-03-15: qty 1

## 2022-03-15 MED ORDER — METHOCARBAMOL 1000 MG/10ML IJ SOLN
1000.0000 mg | Freq: Three times a day (TID) | INTRAVENOUS | Status: DC
  Administered 2022-03-15 – 2022-03-16 (×3): 1000 mg via INTRAVENOUS
  Filled 2022-03-15 (×3): qty 1000

## 2022-03-15 MED ORDER — ONDANSETRON HCL 4 MG/2ML IJ SOLN: 4.0000 mg | Freq: Four times a day (QID) | INTRAMUSCULAR | Status: DC | PRN

## 2022-03-15 MED ORDER — KETOROLAC TROMETHAMINE 30 MG/ML IJ SOLN
30.0000 mg | Freq: Four times a day (QID) | INTRAMUSCULAR | Status: AC
  Administered 2022-03-15 – 2022-03-16 (×4): 30 mg via INTRAVENOUS
  Filled 2022-03-15 (×4): qty 1

## 2022-03-15 NOTE — H&P (Addendum)
Carlos Montoya is an 61 y.o. male.   Chief Complaint: post op pain HPI:  Pt had lap assisted repair of umbilical hernia yesterday (03/14/2022) by Dr. Redmond Pulling at [facility].  He had severe pain after discharge and came to the ED.  He is "unable to move at all."  He denies n/v.    He had Ct that was negative for obstruction or any evidence of bowel injury.  He thinks "there is something wrong with the mesh."    Past Medical History:  Diagnosis Date   Acid reflux    Alcohol use disorder, mild, abuse    Anxiety    Bipolar disorder (HCC)    COPD (chronic obstructive pulmonary disease) (HCC)    Depression    History of hepatitis C 2016   treated   Opioid dependence ([place])    Pneumonia    Umbilical hernia     Past Surgical History:  Procedure Laterality Date   COLONOSCOPY     HERNIA REPAIR     TONSILLECTOMY      Family History  Problem Relation Age of Onset   Diabetes Mother    Heart disease Father    Colon cancer Father    Social History:  reports that he has been smoking cigarettes. He has been smoking an average of .5 packs per day. He has never used smokeless tobacco. He reports current alcohol use. He reports that he does not use drugs.  Allergies:  Allergies  Allergen Reactions   Prednisone Hives   Diphenhydramine Hcl Other (See Comments)    hyper   Tylenol [Acetaminophen]     Does not wish to take acetaminophen   Medications aspirin EC 81 MG tablet atorvastatin (LIPITOR) 20 MG tablet cetirizine (ZYRTEC ALLERGY) 10 MG tablet FLOVENT HFA 44 MCG/ACT inhaler MILK THISTLE PO mirtazapine (REMERON) 15 MG tablet omeprazole (PRILOSEC) 20 MG capsule oxyCODONE (OXY IR/ROXICODONE) 5 MG immediate release tablet  Results for orders placed or performed during the hospital encounter of 03/14/22 (from the past 48 hour(s))  Comprehensive metabolic panel     Status: Abnormal   Collection Time: 03/14/22 11:40 PM  Result Value Ref Range   Sodium 139 135 - 145 mmol/L    Potassium 4.1 3.5 - 5.1 mmol/L    Comment: HEMOLYSIS AT THIS LEVEL MAY AFFECT RESULT   Chloride 103 98 - 111 mmol/L   CO2 27 22 - 32 mmol/L   Glucose, Bld 119 (H) 70 - 99 mg/dL    Comment: Glucose reference range applies only to samples taken after fasting for at least 8 hours.   BUN 7 6 - 20 mg/dL   Creatinine, Ser 0.99 0.61 - 1.24 mg/dL   Calcium 9.6 8.9 - 10.3 mg/dL   Total Protein 7.8 6.5 - 8.1 g/dL   Albumin 4.3 3.5 - 5.0 g/dL   AST 43 (H) 15 - 41 U/L    Comment: HEMOLYSIS AT THIS LEVEL MAY AFFECT RESULT   ALT 33 0 - 44 U/L    Comment: HEMOLYSIS AT THIS LEVEL MAY AFFECT RESULT   Alkaline Phosphatase 68 38 - 126 U/L   Total Bilirubin 0.9 0.3 - 1.2 mg/dL    Comment: HEMOLYSIS AT THIS LEVEL MAY AFFECT RESULT   GFR, Estimated >60 >60 mL/min    Comment: (NOTE) Calculated using the CKD-EPI Creatinine Equation (2021)    Anion gap 9 5 - 15    Comment: Performed at [facility], [place] [address], [place], [place] [address]  CBC with Differential     Status: Abnormal   Collection Time: 03/14/22 11:40 PM  Result Value Ref Range   WBC 15.2 (H) 4.0 - 10.5 K/uL   RBC 4.58 4.22 - 5.81 MIL/uL   Hemoglobin 15.7 13.0 - 17.0 g/dL   HCT 30.1 60.1 - 09.3 %   MCV 100.4 (H) 80.0 - 100.0 fL   MCH 34.3 (H) 26.0 - 34.0 pg   MCHC 34.1 30.0 - 36.0 g/dL   RDW 23.5 57.3 - 22.0 %   Platelets 268 150 - 400 K/uL   nRBC 0.0 0.0 - 0.2 %   Neutrophils Relative % 84 %   Neutro Abs 12.8 (H) 1.7 - 7.7 K/uL   Lymphocytes Relative 9 %   Lymphs Abs 1.3 0.7 - 4.0 K/uL   Monocytes Relative 5 %   Monocytes Absolute 0.8 0.1 - 1.0 K/uL   Eosinophils Relative 0 %   Eosinophils Absolute 0.1 0.0 - 0.5 K/uL   Basophils Relative 0 %   Basophils Absolute 0.0 0.0 - 0.1 K/uL   Immature Granulocytes 2 %   Abs Immature Granulocytes 0.27 (H) 0.00 - 0.07 K/uL    Comment: Performed at [facility], [address], [place], [place] [address]  Protime-INR     Status: None   Collection  Time: 03/14/22 11:40 PM  Result Value Ref Range   Prothrombin Time 13.9 11.4 - 15.2 seconds   INR 1.1 0.8 - 1.2    Comment: (NOTE) INR goal varies based on device and disease states. Performed at [facility], [address] [place], [place] [address]   APTT     Status: None   Collection Time: 03/14/22 11:40 PM  Result Value Ref Range   aPTT 27 24 - 36 seconds    Comment: Performed at [facility], [address], [place], [place] [address]  Ethanol     Status: None   Collection Time: 03/14/22 11:40 PM  Result Value Ref Range   Alcohol, Ethyl (B) <10 <10 mg/dL    Comment: (NOTE) Lowest detectable limit for serum alcohol is 10 mg/dL.  For medical purposes only. Performed at [facility], [address], [place], [place] [address]   Lactic acid, plasma     Status: None   Collection Time: 03/15/22  1:25 AM  Result Value Ref Range   Lactic Acid, Venous 1.2 0.5 - 1.9 mmol/L    Comment: Performed at [facility], [address] [place], [place] [address]  CBC     Status: Abnormal   Collection Time: 03/15/22  6:00 AM  Result Value Ref Range   WBC 13.3 (H) 4.0 - 10.5 K/uL   RBC 4.38 4.22 - 5.81 MIL/uL   Hemoglobin 15.1 13.0 - 17.0 g/dL   HCT 10.6 26.9 - 48.5 %   MCV 101.4 (H) 80.0 - 100.0 fL   MCH 34.5 (H) 26.0 - 34.0 pg   MCHC 34.0 30.0 - 36.0 g/dL   RDW 46.2 70.3 - 50.0 %   Platelets 228 150 - 400 K/uL   nRBC 0.0 0.0 - 0.2 %    Comment: Performed at [facility], [address], [place], [place] [address]  Basic metabolic panel     Status: Abnormal   Collection Time: 03/15/22  6:00 AM  Result Value Ref Range   Sodium 138 135 - 145 mmol/L   Potassium 3.8 3.5 - 5.1 mmol/L   Chloride 105  98 - 111 mmol/L   CO2 27 22 - 32 mmol/L   Glucose, Bld 100 (H) 70 - 99 mg/dL    Comment: Glucose reference range applies only to samples taken after fasting for at least 8 hours.   BUN 7 6 - 20  mg/dL   Creatinine, Ser 0.94 0.61 - 1.24 mg/dL   Calcium 8.8 (L) 8.9 - 10.3 mg/dL   GFR, Estimated >60 >60 mL/min    Comment: (NOTE) Calculated using the CKD-EPI Creatinine Equation (2021)    Anion gap 6 5 - 15    Comment: Performed at [facility], [place] [address] [place], [place] [address]  Magnesium     Status: Abnormal   Collection Time: 03/15/22  6:00 AM  Result Value Ref Range   Magnesium 1.4 (L) 1.7 - 2.4 mg/dL    Comment: Performed at [facility], [place] [address] [place], [place] [address]  Phosphorus     Status: None   Collection Time: 03/15/22  6:00 AM  Result Value Ref Range   Phosphorus 3.7 2.5 - 4.6 mg/dL    Comment: Performed at [facility], [place] [address] [place], [place] [address]   CT ABDOMEN PELVIS W CONTRAST  Result Date: 03/15/2022 CLINICAL DATA:  Abdominal pain.  Status post hernia repair mesh. EXAM: CT ABDOMEN AND PELVIS WITH CONTRAST TECHNIQUE: Multidetector CT imaging of the abdomen and pelvis was performed using the standard protocol following bolus administration of intravenous contrast. RADIATION DOSE REDUCTION: This exam was performed according to the departmental dose-optimization program which includes automated exposure control, adjustment of the mA and/or kV according to patient size and/or use of iterative reconstruction technique. CONTRAST:  156mL OMNIPAQUE IOHEXOL 300 MG/ML  SOLN COMPARISON:  None Available. FINDINGS: Lower chest: There are bibasilar subpleural atelectasis. Pneumonia is not excluded clinical correlation is recommended. Small pneumoperitoneum, postoperative.  No free fluid. Hepatobiliary: No focal liver abnormality is seen. No gallstones, gallbladder wall thickening, or biliary dilatation. Pancreas: Unremarkable. No pancreatic ductal dilatation or surrounding inflammatory changes. Spleen: Normal in size without focal abnormality. Adrenals/Urinary Tract: The adrenal glands are  unremarkable. Focal area of parenchymal atrophy and scarring in the interpolar left kidney. There is no hydronephrosis on either side. There is symmetric enhancement and excretion of contrast by both kidneys. The visualized ureters and urinary bladder appear unremarkable. Stomach/Bowel: There is no bowel obstruction or active inflammation. The appendix is normal. Vascular/Lymphatic: Moderate aortoiliac atherosclerotic disease. The IVC is unremarkable. No portal venous gas. There is no adenopathy. Reproductive: The prostate and seminal vesicles are grossly unremarkable. No pelvic mass Other: Postsurgical changes of anterior abdominal wall and ventral hernia repair with mesh. Small fat containing right inguinal hernia. Musculoskeletal: No acute or significant osseous findings. IMPRESSION: 1. No bowel obstruction. Normal appendix. 2. Small pneumoperitoneum, postoperative. 3. Bibasilar subpleural atelectasis. Pneumonia is not excluded clinical correlation is recommended. 4. Aortic Atherosclerosis (ICD10-I70.0). Electronically Signed   By: Anner Crete M.D.   On: 03/15/2022 02:32   DG Chest Port 1 View  Result Date: 03/15/2022 CLINICAL DATA:  Questionable sepsis. EXAM: PORTABLE CHEST 1 VIEW COMPARISON:  Chest radiograph dated 04/01/2021. FINDINGS: There is mild chronic interstitial coarsening and bibasilar atelectasis/scarring. No focal consolidation, pleural effusion, pneumothorax. The cardiac silhouette is within normal limits. No acute osseous pathology. IMPRESSION: No active disease. Electronically Signed   By: Anner Crete M.D.   On: 03/15/2022 00:48    Review of Systems  Gastrointestinal:  Positive for abdominal distention and abdominal  pain.  All other systems reviewed and are negative.   Blood pressure 125/77, pulse 86, temperature 98.4 F (36.9 C), temperature source Oral, resp. rate 13, height 5\' 11"  (1.803 m), weight 68.9 kg, SpO2 95 %. Physical Exam Vitals reviewed.  Constitutional:       General: He is in acute distress (looks uncomfortable).     Appearance: He is not toxic-appearing or diaphoretic.  HENT:     Head: Normocephalic and atraumatic.     Right Ear: External ear normal.     Left Ear: External ear normal.     Nose: Nose normal.  Eyes:     General: No scleral icterus.    Extraocular Movements: Extraocular movements intact.     Conjunctiva/sclera: Conjunctivae normal.  Cardiovascular:     Rate and Rhythm: Normal rate and regular rhythm.     Pulses: Normal pulses.     Heart sounds: Normal heart sounds.  Pulmonary:     Effort: Pulmonary effort is normal.     Comments: Some wheezing  Abdominal:     General: There is no distension.     Palpations: Abdomen is soft.     Tenderness: There is abdominal tenderness (diffuse). There is no guarding or rebound.     Hernia: No hernia is present.     Comments: Hypoactive bowel sounds  Musculoskeletal:        General: No swelling, tenderness or deformity.     Cervical back: Neck supple.  Skin:    General: Skin is warm and dry.     Capillary Refill: Capillary refill takes 2 to 3 seconds.     Coloration: Skin is not jaundiced.  Neurological:     General: No focal deficit present.     Mental Status: He is alert and oriented to person, place, and time.     Sensory: No sensory deficit.     Motor: No weakness.     Coordination: Coordination normal.  Psychiatric:        Mood and Affect: Mood normal.        Behavior: Behavior normal.        Thought Content: Thought content normal.        Judgment: Judgment normal.      Assessment/Plan S/p lap umbilical hernia repair 03/14/2022 Dr. 05/14/2022, POD 1  - severe pain H/o chronic pain COPD, asthma Anxiety Tobacco abuse  Do not see evidence of bowel injury. Mesh appears to lay flat on CT.   Pain most likely secondary to transfascial sutures.    Multimodal pain control - adding gabapentin, iv methocarbamol, toradol Inhalers Clears- can have soft diet later today if  tolerates.   Bowel regimen   Andrey Campanile, MD FACS Surgical Oncology, General Surgery, Trauma and Critical [facility], [facility] [place] for weekday/non holidays Check [email or web address] for coverage night/weekend/holidays

## 2022-03-15 NOTE — ED Notes (Signed)
Pt appears to be sleeping, observe even RR and unlabored, NAD noted, side rails up x2 for safety, plan of care ongoing, call light within reach, no further concerns as of present.   

## 2022-03-15 NOTE — ED Provider Notes (Signed)
[facility] DEPT Provider Note   CSN: [id number] Arrival date & time: 03/14/22  2258     History  Chief Complaint  Patient presents with   Post-op Problem    Carlos Montoya is a 61 y.o. male.  The history is provided by the patient.   Patient presents for severe abdominal pain.  Patient reports he underwent umbilical hernia repair earlier in the day on September 8, without any known complications.  He reports after going home he started having severe abdominal pain.  He took a pain pill around 5:30 PM without relief.  He took another 1 soon after but the pain became severe.  He reports nausea but no vomiting.  Reports feeling short of breath.  He reports feeling feverish.    Home Medications Prior to Admission medications   Medication Sig Start Date End Date Taking? Authorizing Provider  aspirin EC 81 MG tablet Take 81 mg by mouth daily. Swallow whole.    [provider]  atorvastatin (LIPITOR) 20 MG tablet Take 1 tablet (20 mg total) by mouth daily. 09/17/20   Sharion Settler, DO  cetirizine (ZYRTEC ALLERGY) 10 MG tablet Take 1 tablet (10 mg total) by mouth daily. Patient not taking: Reported on 02/27/2022 11/06/21   Shary Key, DO  FLOVENT HFA 44 MCG/ACT inhaler INHALE 2 PUFFS INTO THE LUNGS TWICE A DAY 09/30/21   Sharion Settler, DO  MILK THISTLE PO Take 1 tablet by mouth daily.    [provider]  mirtazapine (REMERON) 15 MG tablet Take 1/2 tablet (7.5 mg total) nightly for 6 days then INCREASE to 1 tablet (15 mg total) nightly thereafter. 02/27/22   Corky Sox, MD  omeprazole (PRILOSEC) 20 MG capsule TAKE 1 CAPSULE BY MOUTH EVERY DAY 02/17/22   Sharion Settler, DO  oxyCODONE (OXY IR/ROXICODONE) 5 MG immediate release tablet Take 1 tablet (5 mg total) by mouth every 6 (six) hours as needed for severe pain. 03/14/22   Greer Pickerel, MD      Allergies    Prednisone, Diphenhydramine hcl, and Tylenol  [acetaminophen]    Review of Systems   Review of Systems  Constitutional:  Positive for fever.  Gastrointestinal:  Positive for abdominal pain and nausea.    Physical Exam Updated Vital Signs BP (!) 143/90   Pulse 95   Temp 100 F (37.8 C) (Oral)   Resp (!) 22   Ht 1.803 m (5\' 11" )   Wt 68.9 kg   SpO2 94%   BMI 21.20 kg/m  Physical Exam CONSTITUTIONAL: Elderly, uncomfortable appearing HEAD: Normocephalic/atraumatic EYES: EOMI ENMT: Mucous membranes moist NECK: supple no meningeal signs CV: S1/S2 noted, no murmurs/rubs/gallops noted LUNGS: Lungs are clear to auscultation bilaterally, no apparent distress ABDOMEN: soft, diffuse moderate tenderness, incisions are intact without drainage or bleeding. NEURO: Pt is awake/alert/appropriate, moves all extremitiesx4.  No facial droop.   EXTREMITIES:full ROM SKIN: warm, color normal PSYCH: Anxious ED Results / Procedures / Treatments   Labs (all labs ordered are listed, but only abnormal results are displayed) Labs Reviewed  COMPREHENSIVE METABOLIC PANEL - Abnormal; Notable for the following components:      Result Value   Glucose, Bld 119 (*)    AST 43 (*)    All other components within normal limits  CBC WITH DIFFERENTIAL/PLATELET - Abnormal; Notable for the following components:   WBC 15.2 (*)    MCV 100.4 (*)    MCH 34.3 (*)    Neutro Abs 12.8 (*)  Abs Immature Granulocytes 0.27 (*)    All other components within normal limits  CULTURE, BLOOD (ROUTINE X 2)  CULTURE, BLOOD (ROUTINE X 2)  LACTIC ACID, PLASMA  PROTIME-INR  APTT  ETHANOL  LACTIC ACID, PLASMA    EKG EKG Interpretation  Date/Time:  Friday March 14 2022 23:48:16 EDT Ventricular Rate:  86 PR Interval:  120 QRS Duration: 145 QT Interval:  389 QTC Calculation: 466 R Axis:   125 Text Interpretation: Sinus rhythm RBBB and LPFB Confirmed by Zadie Rhine ([id number]) on 03/14/2022 11:59:31 PM  Radiology CT ABDOMEN PELVIS W CONTRAST  Result  Date: 03/15/2022 CLINICAL DATA:  Abdominal pain.  Status post hernia repair mesh. EXAM: CT ABDOMEN AND PELVIS WITH CONTRAST TECHNIQUE: Multidetector CT imaging of the abdomen and pelvis was performed using the standard protocol following bolus administration of intravenous contrast. RADIATION DOSE REDUCTION: This exam was performed according to the departmental dose-optimization program which includes automated exposure control, adjustment of the mA and/or kV according to patient size and/or use of iterative reconstruction technique. CONTRAST:  OMNIPAQUE IOHEXOL 300 MG/ML  SOLN COMPARISON:  None Available. FINDINGS: Lower chest: There are bibasilar subpleural atelectasis. Pneumonia is not excluded clinical correlation is recommended. Small pneumoperitoneum, postoperative.  No free fluid. Hepatobiliary: No focal liver abnormality is seen. No gallstones, gallbladder wall thickening, or biliary dilatation. Pancreas: Unremarkable. No pancreatic ductal dilatation or surrounding inflammatory changes. Spleen: Normal in size without focal abnormality. Adrenals/Urinary Tract: The adrenal glands are unremarkable. Focal area of parenchymal atrophy and scarring in the interpolar left kidney. There is no hydronephrosis on either side. There is symmetric enhancement and excretion of contrast by both kidneys. The visualized ureters and urinary bladder appear unremarkable. Stomach/Bowel: There is no bowel obstruction or active inflammation. The appendix is normal. Vascular/Lymphatic: Moderate aortoiliac atherosclerotic disease. The IVC is unremarkable. No portal venous gas. There is no adenopathy. Reproductive: The prostate and seminal vesicles are grossly unremarkable. No pelvic mass Other: Postsurgical changes of anterior abdominal wall and ventral hernia repair with mesh. Small fat containing right inguinal hernia. Musculoskeletal: No acute or significant osseous findings. IMPRESSION: 1. No bowel obstruction. Normal  appendix. 2. Small pneumoperitoneum, postoperative. 3. Bibasilar subpleural atelectasis. Pneumonia is not excluded clinical correlation is recommended. 4. Aortic Atherosclerosis (ICD10-I70.0). Electronically Signed   By: Elgie Collard M.D.   On: 03/15/2022 02:32   DG Chest Port 1 View  Result Date: 03/15/2022 CLINICAL DATA:  Questionable sepsis. EXAM: PORTABLE CHEST 1 VIEW COMPARISON:  Chest radiograph dated 04/01/2021. FINDINGS: There is mild chronic interstitial coarsening and bibasilar atelectasis/scarring. No focal consolidation, pleural effusion, pneumothorax. The cardiac silhouette is within normal limits. No acute osseous pathology. IMPRESSION: No active disease. Electronically Signed   By: Elgie Collard M.D.   On: 03/15/2022 00:48    Procedures Procedures    Medications Ordered in ED Medications  fentaNYL (SUBLIMAZE) injection 50 mcg (has no administration in time range)  lactated ringers bolus 1,000 mL (0 mLs Intravenous Stopped 03/15/22 0139)  fentaNYL (SUBLIMAZE) injection 100 mcg (100 mcg Intravenous Given 03/14/22 2347)  ondansetron (ZOFRAN) injection 4 mg (4 mg Intravenous Given 03/14/22 2345)  fentaNYL (SUBLIMAZE) injection 100 mcg (100 mcg Intravenous Given 03/15/22 0117)  pantoprazole (PROTONIX) injection 40 mg (40 mg Intravenous Given 03/15/22 0218)  iohexol (OMNIPAQUE) 300 MG/ML solution 100 mL (100 mLs Intravenous Contrast Given 03/15/22 0147)    ED Course/ Medical Decision Making/ A&P Clinical Course as of 03/15/22 0302  Sat Mar 15, 2022  0051 Discussed with Dr. Donell Beers  with general surgery.  She recommends CT abdomen pelvis with IV and p.o. contrast to evaluate for any leak [DW]  0301 CT findings showed expected postoperative changes, no acute issues at this time.  Patient still with significant diffuse abdominal pain.  He is very uncomfortable.  Patient will be admitted.  Discussed with Dr. Donell Beers who will admit to general surgery [DW]    Clinical Course User Index [DW]  Zadie Rhine, MD                           Medical Decision Making Amount and/or Complexity of Data Reviewed Labs: ordered. Radiology: ordered. ECG/medicine tests: ordered.  Risk Prescription drug management. Decision regarding hospitalization.   This patient presents to the ED for concern of postoperative abdominal pain, this involves an extensive number of treatment options, and is a complaint that carries with it a high risk of complications and morbidity.  The differential diagnosis includes but is not limited to bowel obstruction, bowel perforation, vascular injury  Comorbidities that complicate the patient evaluation: Patient's presentation is complicated by their history of hyperlipidemia  Social Determinants of Health: Patient's  tobacco use   increases the complexity of managing their presentation  Additional history obtained: Records reviewed  OR notes reviewed  Lab Tests: I Ordered, and personally interpreted labs.  The pertinent results include: Leukocytosis  Imaging Studies ordered: I ordered imaging studies including X-ray chest   I independently visualized and interpreted imaging which showed no acute findings I agree with the radiologist interpretation CT abdomen pelvis does not reveal any acute postoperative issues  Cardiac Monitoring: The patient was maintained on a cardiac monitor.  I personally viewed and interpreted the cardiac monitor which showed an underlying rhythm of:  sinus rhythm  Medicines ordered and prescription drug management: I ordered medication including fentanyl for pain Reevaluation of the patient after these medicines showed that the patient    stayed the same   Consultations Obtained: I requested consultation with the consultant General surgery , and discussed  findings as well as pertinent plan - they recommend: Admit  Reevaluation: After the interventions noted above, I reevaluated the patient and found that they have :stayed  the same  Complexity of problems addressed: Patient's presentation is most consistent with  acute presentation with potential threat to life or bodily function  Disposition: After consideration of the diagnostic results and the patient's response to treatment,  I feel that the patent would benefit from admission   .           Final Clinical Impression(s) / ED Diagnoses Final diagnoses:  Post-op pain    Rx / DC Orders ED Discharge Orders     None         Zadie Rhine, MD 03/15/22 [phone]

## 2022-03-15 NOTE — ED Notes (Signed)
Pt reporting burning with urination

## 2022-03-16 MED ORDER — GABAPENTIN 300 MG PO CAPS
300.0000 mg | ORAL_CAPSULE | Freq: Three times a day (TID) | ORAL | Status: DC
  Administered 2022-03-16 – 2022-03-17 (×3): 300 mg via ORAL
  Filled 2022-03-16 (×4): qty 1

## 2022-03-16 MED ORDER — POLYETHYLENE GLYCOL 3350 17 G PO PACK
51.0000 g | PACK | Freq: Once | ORAL | Status: AC
  Administered 2022-03-16: 51 g via ORAL
  Filled 2022-03-16: qty 3

## 2022-03-16 MED ORDER — METHOCARBAMOL 500 MG PO TABS: 1000.0000 mg | ORAL_TABLET | Freq: Four times a day (QID) | ORAL | Status: DC | PRN

## 2022-03-16 MED ORDER — NAPROXEN 250 MG PO TABS
500.0000 mg | ORAL_TABLET | Freq: Two times a day (BID) | ORAL | Status: DC
  Administered 2022-03-16 – 2022-03-17 (×2): 500 mg via ORAL
  Filled 2022-03-16 (×2): qty 2

## 2022-03-16 MED ORDER — METHOCARBAMOL 500 MG PO TABS
1000.0000 mg | ORAL_TABLET | Freq: Three times a day (TID) | ORAL | Status: DC
  Administered 2022-03-16 – 2022-03-17 (×3): 1000 mg via ORAL
  Filled 2022-03-16 (×3): qty 2

## 2022-03-16 NOTE — Progress Notes (Addendum)
Carlos Montoya [id number] [birth year]  CARE TEAM:  PCP: Sabino Dick, DO  Outpatient Care Team: Patient Care Team: Sabino Dick, DO as PCP - General (Family Medicine)  Inpatient Treatment Team: Treatment Team: Attending Provider: [name], MD; Utilization Review: Helyn Numbers, RN; Registered Nurse: Sherian Maroon, RN; Mobility Specialist: Lorina Rabon   Problem List:   Principal Problem:   Umbilical hernia Active Problems:   H/O hernia repair      Laparoscopic Assisted Repair of Incarcerated Umbilical Hernia with Mesh Procedure Note   Indications: Symptomatic incarcerated umbilical hernia  Pre-operative Diagnosis: incarcerated umbilical hernia   Post-operative Diagnosis: incarcerated umbilical hernias (incarcerated with preperitoneal fat, defect measured 3cm)   Surgeon: [name] MD FACS        Assessment  Challenging pain control patient with prior opioid dependence and hepatitis C.  [facility] Stay = 1 days)  Plan:  -Work to try and reassure the patient that there is no evidence of any cellulitis or mesh disruption or injury.  He seems better reassured.  -Multimodal pain control.  He declines acetaminophen given his history of hepatitis C in the past and was told not to use that.  I tried to counter would be safe but he still declines.  We will try naproxen.  Increase gabapentin.  Scheduled methocarbamol. Continue ice pack.  He can do showers and heating pad.  Again offered binder.  Given his history of prior opioid dependence/addiction, I think he is trying to avoid using narcotics but does not want to talk about it.  We will have it as a backup but hold off on scheduled for now.  Suspect some constipation issues as well.  Continue fiber twice daily and Senokot with MiraLAX for breakthrough.  Give extra MiraLAX to kick start things.  Hopefully it should help.  -VTE prophylaxis- SCDs, etc  -mobilize as tolerated to  help recovery.  Try and worked her soreness by walking in hallways to make sure he can reach goals.  STOP SMOKING! We talked to the patient about the dangers of smoking.  We stressed that tobacco use dramatically increases the risk of peri-operative complications such as infection, tissue necrosis leaving to problems with incision/wound and organ healing, hernia, chronic pain, heart attack, stroke, DVT, pulmonary embolism, and death.  We noted there are programs in our community to help stop smoking.  Information was available.   He was hesitant to leave today and wishes to try to increase his oral pain control before leaving.  That is not unreasonable.  Hopefully can leave tomorrow.  We will see.  Disposition:  Disposition:  The patient is from: Home  Anticipate discharge to:  Home  Anticipated Date of Discharge is:  September 11,2023    Barriers to discharge:  Pending Clinical improvement (more likely than not)  Patient currently is NOT MEDICALLY STABLE for discharge from the hospital from a surgery standpoint.      I reviewed nursing notes, last 24 h vitals and pain scores, last 48 h intake and output, last 24 h labs and trends, and last 24 h imaging results. I have reviewed this patient's available data, including medical history, events of note, test results, etc as part of my evaluation.  A significant portion of that time was spent in counseling.  Care during the described time interval was provided by me.  This care required moderate level of medical decision making.  03/16/2022    Subjective: (Chief complaint)  Patient still with some  soreness but starting to feel little better.  Having flatus but no bowel movement.  Difficulty sleeping.  Objective:  Vital signs:  Vitals:   03/15/22 2110 03/16/22 0133 03/16/22 0555 03/16/22 0640  BP: 111/73 99/72 127/83   Pulse: 86 82 98   Resp: 16 16 18    Temp: 98.2 F (36.8 C) 98.5 F (36.9 C) 100.1 F (37.8 C)   TempSrc:  Oral Oral Oral   SpO2: 91% 90% (!) 83% 92%  Weight:      Height:        Last BM Date : 03/13/22  Intake/Output   Yesterday:  09/09 0701 - 09/10 0700 In: 972.9 [P.O.:360; I.V.:392.9; IV Piggyback:220] Out: 850 [Urine:850] This shift:  No intake/output data recorded.  Bowel function:  Flatus: YES  BM:  No  Drain: (No drain)   Physical Exam:  General: Pt awake/alert in no acute distress.  Smells of tobacco.  Hygiene good. Eyes: PERRL, normal EOM.  Sclera clear.  No icterus Neuro: CN II-XII intact w/o focal sensory/motor deficits. Lymph: No head/neck/groin lymphadenopathy Psych:  No delerium/psychosis/paranoia.  Oriented x 4 HENT: Normocephalic, Mucus membranes moist.  No thrush Neck: Supple, No tracheal deviation.  No obvious thyromegaly Chest: No pain to chest wall compression.  Good respiratory excursion.  No audible wheezing CV:  Pulses intact.  Regular rhythm.  No major extremity edema MS: Normal AROM mjr joints.  No obvious deformity  Abdomen: Soft.  Mildy distended.  Tenderness at periumbilical incisions only. .  Minimal erythema around region consistent with postoperative result.  No evidence of any cellulitis purulence or drainage or abscess.  No guarding.  No evidence of peritonitis.  No incarcerated hernias.  Ext:   No deformity.  No mjr edema.  No cyanosis Skin: No petechiae / purpurea.  No major sores.  Warm and dry    Results:   Cultures: Recent Results (from the past 720 hour(s))  Blood Culture (routine x 2)     Status: None (Preliminary result)   Collection Time: 03/14/22 11:28 PM   Specimen: BLOOD  Result Value Ref Range Status   Specimen Description   Final    BLOOD RIGHT ANTECUBITAL Performed at [facility], [address], [place], [place]    Special Requests   Final    BOTTLES DRAWN AEROBIC AND ANAEROBIC Blood Culture adequate volume Performed at [facility], [address], [place],  [place]    Culture   Final    NO GROWTH 1 DAY Performed at [facility], [address], [place], [place]    Report Status PENDING  Incomplete  Blood Culture (routine x 2)     Status: None (Preliminary result)   Collection Time: 03/14/22 11:40 PM   Specimen: BLOOD  Result Value Ref Range Status   Specimen Description   Final    BLOOD LEFT ANTECUBITAL Performed at [facility], [address] [place], [place]    Special Requests   Final    BOTTLES DRAWN AEROBIC AND ANAEROBIC Blood Culture adequate volume Performed at [facility], [address], [place], [place]    Culture   Final    NO GROWTH 1 DAY Performed at [facility], [address], [place], [place]    Report Status PENDING  Incomplete    Labs: Results for orders placed or performed during the hospital encounter of 03/14/22 (from the past 48  hour(s))  Blood Culture (routine x 2)     Status: None (Preliminary result)   Collection Time: 03/14/22 11:28 PM   Specimen: BLOOD  Result Value Ref Range   Specimen Description      BLOOD RIGHT ANTECUBITAL Performed at [facility], [address] [place], [place] [address]    Special Requests      BOTTLES DRAWN AEROBIC AND ANAEROBIC Blood Culture adequate volume Performed at [facility], [address], [place], [place] [address]    Culture      NO GROWTH 1 DAY Performed at [facility], [address], [place], [place] [address]    Report Status PENDING   Comprehensive metabolic panel     Status: Abnormal   Collection Time: 03/14/22 11:40 PM  Result Value Ref Range   Sodium 139 135 - 145 mmol/L   Potassium 4.1 3.5 - 5.1 mmol/L    Comment: HEMOLYSIS AT THIS LEVEL MAY AFFECT RESULT   Chloride 103 98 - 111 mmol/L   CO2 27 22 - 32 mmol/L   Glucose, Bld 119 (H) 70 - 99 mg/dL    Comment: Glucose reference range applies only to samples  taken after fasting for at least 8 hours.   BUN 7 6 - 20 mg/dL   Creatinine, Ser 4.13 0.61 - 1.24 mg/dL   Calcium 9.6 8.9 - 24.4 mg/dL   Total Protein 7.8 6.5 - 8.1 g/dL   Albumin 4.3 3.5 - 5.0 g/dL   AST 43 (H) 15 - 41 U/L    Comment: HEMOLYSIS AT THIS LEVEL MAY AFFECT RESULT   ALT 33 0 - 44 U/L    Comment: HEMOLYSIS AT THIS LEVEL MAY AFFECT RESULT   Alkaline Phosphatase 68 38 - 126 U/L   Total Bilirubin 0.9 0.3 - 1.2 mg/dL    Comment: HEMOLYSIS AT THIS LEVEL MAY AFFECT RESULT   GFR, Estimated >60 >60 mL/min    Comment: (NOTE) Calculated using the CKD-EPI Creatinine Equation (2021)    Anion gap 9 5 - 15    Comment: Performed at [facility], [address], [place], [place] [address]  CBC with Differential     Status: Abnormal   Collection Time: 03/14/22 11:40 PM  Result Value Ref Range   WBC 15.2 (H) 4.0 - 10.5 K/uL   RBC 4.58 4.22 - 5.81 MIL/uL   Hemoglobin 15.7 13.0 - 17.0 g/dL   HCT 25.3 66.4 - 40.3 %   MCV 100.4 (H) 80.0 - 100.0 fL   MCH 34.3 (H) 26.0 - 34.0 pg   MCHC 34.1 30.0 - 36.0 g/dL   RDW 47.4 25.9 - 56.3 %   Platelets 268 150 - 400 K/uL   nRBC 0.0 0.0 - 0.2 %   Neutrophils Relative % 84 %   Neutro Abs 12.8 (H) 1.7 - 7.7 K/uL   Lymphocytes Relative 9 %   Lymphs Abs 1.3 0.7 - 4.0 K/uL   Monocytes Relative 5 %   Monocytes Absolute 0.8 0.1 - 1.0 K/uL   Eosinophils Relative 0 %   Eosinophils Absolute 0.1 0.0 - 0.5 K/uL   Basophils Relative 0 %   Basophils Absolute 0.0 0.0 - 0.1 K/uL   Immature Granulocytes 2 %   Abs Immature Granulocytes 0.27 (H) 0.00 - 0.07 K/uL    Comment: Performed at [facility], [address], [place], [place] [address]  Protime-INR     Status: None   Collection Time: 03/14/22  11:40 PM  Result Value Ref Range   Prothrombin Time 13.9 11.4 - 15.2 seconds   INR 1.1 0.8 - 1.2    Comment: (NOTE) INR goal varies based on device and disease states. Performed at [facility], [address] [place], [place] [address]   APTT     Status: None   Collection Time: 03/14/22 11:40 PM  Result Value Ref Range   aPTT 27 24 - 36 seconds    Comment: Performed at [facility], [address] [place], [place] [address]  Blood Culture (routine x 2)     Status: None (Preliminary result)   Collection Time: 03/14/22 11:40 PM   Specimen: BLOOD  Result Value Ref Range   Specimen Description      BLOOD LEFT ANTECUBITAL Performed at [facility], [address], [place], [place] [address]    Special Requests      BOTTLES DRAWN AEROBIC AND ANAEROBIC Blood Culture adequate volume Performed at [facility], [address] [place], [place] [address]    Culture      NO GROWTH 1 DAY Performed at [facility], [address], [place], [place] [address]    Report Status PENDING   Ethanol     Status: None   Collection Time: 03/14/22 11:40 PM  Result Value Ref Range   Alcohol, Ethyl (B) <10 <10 mg/dL    Comment: (NOTE) Lowest detectable limit for serum alcohol is 10 mg/dL.  For medical purposes only. Performed at [facility], [address] [place], [place] [address]   Lactic acid, plasma     Status: None   Collection Time: 03/15/22  1:25 AM  Result Value Ref Range   Lactic Acid, Venous 1.2 0.5 - 1.9 mmol/L    Comment: Performed at [facility], [address], [place], [place] [address]  CBC     Status: Abnormal   Collection Time: 03/15/22  6:00 AM  Result Value Ref Range   WBC 13.3 (H) 4.0 - 10.5 K/uL   RBC 4.38 4.22 - 5.81 MIL/uL   Hemoglobin 15.1 13.0 - 17.0 g/dL   HCT 93.7 90.2 - 40.9 %   MCV 101.4 (H) 80.0 - 100.0 fL   MCH 34.5 (H) 26.0 - 34.0 pg   MCHC 34.0 30.0 - 36.0 g/dL   RDW 73.5 32.9 - 92.4 %   Platelets 228 150 - 400 K/uL   nRBC 0.0 0.0 - 0.2 %    Comment: Performed at [facility], [address] [place], [place] [address]  Basic  metabolic panel     Status: Abnormal   Collection Time: 03/15/22  6:00 AM  Result Value Ref Range   Sodium 138 135 - 145 mmol/L   Potassium 3.8 3.5 - 5.1 mmol/L   Chloride 105 98 - 111 mmol/L   CO2 27 22 - 32 mmol/L   Glucose, Bld 100 (H) 70 - 99 mg/dL    Comment: Glucose reference range applies only to samples taken after fasting for at least 8 hours.   BUN 7 6 - 20 mg/dL   Creatinine, Ser 1.96 0.61 - 1.24 mg/dL   Calcium 8.8 (L) 8.9 - 10.3 mg/dL   GFR, Estimated >22 >29 mL/min    Comment: (NOTE) Calculated using the CKD-EPI Creatinine Equation (2021)    Anion gap 6 5 - 15    Comment: Performed at [facility], [address] [place], [place] [address]  Magnesium     Status: Abnormal   Collection Time: 03/15/22  6:00 AM  Result Value Ref Range   Magnesium 1.4 (L) 1.7 - 2.4 mg/dL    Comment: Performed at [facility], [address] [place], [place] [address]  Phosphorus     Status: None   Collection Time: 03/15/22  6:00 AM  Result Value Ref Range   Phosphorus 3.7 2.5 - 4.6 mg/dL    Comment: Performed at [facility], [address] [place], [place] [address]    Imaging / Studies: CT ABDOMEN PELVIS W CONTRAST  Result Date: 03/15/2022 CLINICAL DATA:  Abdominal pain.  Status post hernia repair mesh. EXAM: CT ABDOMEN AND PELVIS WITH CONTRAST TECHNIQUE: Multidetector CT imaging of the abdomen and pelvis was performed using the standard protocol following bolus administration of intravenous contrast. RADIATION DOSE REDUCTION: This exam was performed according to the departmental dose-optimization program which includes automated exposure control, adjustment of the mA and/or kV according to patient size and/or use of iterative reconstruction technique. CONTRAST:  100mL OMNIPAQUE IOHEXOL 300 MG/ML  SOLN COMPARISON:  None Available. FINDINGS: Lower chest: There are bibasilar subpleural atelectasis. Pneumonia is not excluded clinical  correlation is recommended. Small pneumoperitoneum, postoperative.  No free fluid. Hepatobiliary: No focal liver abnormality is seen. No gallstones, gallbladder wall thickening, or biliary dilatation. Pancreas: Unremarkable. No pancreatic ductal dilatation or surrounding inflammatory changes. Spleen: Normal in size without focal abnormality. Adrenals/Urinary Tract: The adrenal glands are unremarkable. Focal area of parenchymal atrophy and scarring in the interpolar left kidney. There is no hydronephrosis on either side. There is symmetric enhancement and excretion of contrast by both kidneys. The visualized ureters and urinary bladder appear unremarkable. Stomach/Bowel: There is no bowel obstruction or active inflammation. The appendix is normal. Vascular/Lymphatic: Moderate aortoiliac atherosclerotic disease. The IVC is unremarkable. No portal venous gas. There is no adenopathy. Reproductive: The prostate and seminal vesicles are grossly unremarkable. No pelvic mass Other: Postsurgical changes of anterior abdominal wall and ventral hernia repair with mesh. Small fat containing right inguinal hernia. Musculoskeletal: No acute or significant osseous findings. IMPRESSION: 1. No bowel obstruction. Normal appendix. 2. Small pneumoperitoneum, postoperative. 3. Bibasilar subpleural atelectasis. Pneumonia is not excluded clinical correlation is recommended. 4. Aortic Atherosclerosis (ICD10-I70.0). Electronically Signed   By: Elgie CollardArash  Radparvar M.D.   On: 03/15/2022 02:32   DG Chest Port 1 View  Result Date: 03/15/2022 CLINICAL DATA:  Questionable sepsis. EXAM: PORTABLE CHEST 1 VIEW COMPARISON:  Chest radiograph dated 04/01/2021. FINDINGS: There is mild chronic interstitial coarsening and bibasilar atelectasis/scarring. No focal consolidation, pleural effusion, pneumothorax. The cardiac silhouette is within normal limits. No acute osseous pathology. IMPRESSION: No active disease. Electronically Signed   By: Elgie CollardArash   Radparvar M.D.   On: 03/15/2022 00:48    Medications / Allergies: per chart  Antibiotics: Anti-infectives (From admission, onward)    None         Note: Portions of this report may have been transcribed using voice recognition software. Every effort was made to ensure accuracy; however, inadvertent computerized transcription errors may be present.   Any transcriptional errors that result from this process are unintentional.    Ardeth SportsmanSteven C. Brayton Baumgartner, MD, FACS, MASCRS Esophageal, Gastrointestinal & Colorectal Surgery Robotic and Minimally Invasive Surgery  [facility] [address] [place], [place] [id number] [phone] Fax [phone] Main  CONTACT INFORMATION:  Weekday (9AM-5PM): Call [facility] main  office at [phone]  Weeknight (5PM-9AM) or Weekend/Holiday: Check [email or web address] (password " [email or web address]") for General Surgery [facility] coverage  (Please, do not use SecureChat as it is not reliable communication to reach operating surgeons for immediate patient care)      03/16/2022  9:11 AM

## 2022-03-17 MED ORDER — GABAPENTIN 300 MG PO CAPS: 300.0000 mg | ORAL_CAPSULE | Freq: Three times a day (TID) | ORAL | 0 refills | Status: DC

## 2022-03-17 MED ORDER — METHOCARBAMOL 1000 MG PO TABS: 1000.0000 mg | ORAL_TABLET | Freq: Three times a day (TID) | ORAL | 0 refills | Status: AC

## 2022-03-17 MED ORDER — POLYETHYLENE GLYCOL 3350 17 G PO PACK: 17.0000 g | PACK | Freq: Two times a day (BID) | ORAL | 0 refills | Status: DC | PRN

## 2022-03-17 NOTE — Discharge Summary (Signed)
Physician Discharge Summary  Carlos Montoya JKK:[id number] DOB: [birth year] DOA: 03/14/2022  PCP: Sabino Dick, DO  Admit date: 03/14/2022 Discharge date: 03/17/2022  Recommendations for Outpatient Follow-up:     Follow-up Information     Carlos Adu, MD Follow up.   Specialty: General Surgery Contact information: [address] [place] [address] [phone]                Discharge Diagnoses:  Incarcerated umbilical hernia status post repair Postoperative pain  Surgical Procedure: Laparoscopic assisted repair of incarcerated umbilical hernia with mesh September 8  Discharge Condition: Good Disposition: Home  Diet recommendation: Regular  Filed Weights   03/14/22 2309  Weight: 68.9 kg    History of present illness:  61 year old gentleman who underwent planned repair of incarcerated umbilical hernia with mesh.  He underwent primary repair with laparoscopic placement of mesh.  He was discharged from PACU.    Hospital Course:  Patient came back to the hospital later that evening complaining of severe pain and difficulty moving given how much pain he was in.  Imaging and labs were performed which were unremarkable.  There is no evidence of a postoperative complication.  He was admitted for pain control.  He was placed on scheduled Neurontin, NSAID, muscle relaxant and as needed opioid narcotic.  The patient was trying to avoid opioid use is much as possible.  On September 11 he was felt stable for discharge.  His pain which is much more controlled.  He was tolerating a diet.  He had no nausea or vomiting.  His vital signs were stable.  We discussed pain control.  He was also placed in an abdominal binder.  BP 123/78 (BP Location: Right Arm)   Pulse 83   Temp 97.6 F (36.4 C) (Oral)   Resp 18   Ht 5\' 11"  (1.803 m)   Wt 68.9 kg   SpO2 93%   BMI 21.20 kg/m   Gen: alert, NAD, non-toxic appearing Pupils: equal, no scleral icterus Pulm:  Lungs clear to auscultation, symmetric chest rise CV: regular rate and rhythm Abd: soft, mild approp tender, mild distension. A little puffiness around umbilicus. No cellulitis. No incisional hernia Ext: no edema, no calf tenderness Skin: no rash, no jaundice    Discharge Instructions  Discharge Instructions     Call MD for:   Complete by: As directed    Temperature >101   Call MD for:  hives   Complete by: As directed    Call MD for:  persistant dizziness or light-headedness   Complete by: As directed    Call MD for:  persistant nausea and vomiting   Complete by: As directed    Call MD for:  redness, tenderness, or signs of infection (pain, swelling, redness, odor or green/yellow discharge around incision site)   Complete by: As directed    Call MD for:  severe uncontrolled pain   Complete by: As directed    Diet - low sodium heart healthy   Complete by: As directed    Discharge instructions   Complete by: As directed    See [facility] discharge instructions   Increase activity slowly   Complete by: As directed       Allergies as of 03/17/2022       Reactions   Prednisone Hives   Diphenhydramine Hcl Other (See Comments)   Has opposite effect   Tylenol [acetaminophen] Other (See Comments)   Told to avoid due to having hepatitis  C previously        Medication List     TAKE these medications    albuterol 108 (90 Base) MCG/ACT inhaler Commonly known as: VENTOLIN HFA Inhale 2 puffs into the lungs every 6 (six) hours as needed for wheezing or shortness of breath.   aspirin EC 81 MG tablet Take 81 mg by mouth daily.   atorvastatin 20 MG tablet Commonly known as: LIPITOR Take 1 tablet (20 mg total) by mouth daily.   cetirizine 10 MG tablet Commonly known as: ZyrTEC Allergy Take 1 tablet (10 mg total) by mouth daily. What changed:  when to take this reasons to take this   Flovent HFA 44 MCG/ACT inhaler Generic drug: fluticasone INHALE 2 PUFFS INTO THE LUNGS  TWICE A DAY What changed: See the new instructions.   fluticasone 50 MCG/ACT nasal spray Commonly known as: FLONASE Place 1-2 sprays into both nostrils daily as needed for allergies.   gabapentin 300 MG capsule Commonly known as: NEURONTIN Take 1 capsule (300 mg total) by mouth 3 (three) times daily.   ibuprofen 600 MG tablet Commonly known as: ADVIL Take 600 mg by mouth every 6 (six) hours as needed for mild pain.   LUBRICATING EYE DROPS OP Place 1 drop into both eyes daily as needed (burning eyes).   Methocarbamol 1000 MG Tabs Take 1,000 mg by mouth 3 (three) times daily for 14 days.   MILK THISTLE PO Take 1 tablet by mouth daily.   mirtazapine 15 MG tablet Commonly known as: Remeron Take 1/2 tablet (7.5 mg total) nightly for 6 days then INCREASE to 1 tablet (15 mg total) nightly thereafter. What changed:  how much to take how to take this when to take this additional instructions   omeprazole 20 MG capsule Commonly known as: PRILOSEC TAKE 1 CAPSULE BY MOUTH EVERY DAY What changed: how much to take   oxyCODONE 5 MG immediate release tablet Commonly known as: Oxy IR/ROXICODONE Take 1 tablet (5 mg total) by mouth every 6 (six) hours as needed for severe pain.   polyethylene glycol 17 g packet Commonly known as: MIRALAX / GLYCOLAX Take 17 g by mouth every 12 (twelve) hours as needed for mild constipation, moderate constipation or severe constipation.        Follow-up Information     Carlos Adu, MD Follow up.   Specialty: General Surgery Contact information: [address] [place] [address] [phone]                  The results of significant diagnostics from this hospitalization (including imaging, microbiology, ancillary and laboratory) are listed below for reference.    Significant Diagnostic Studies: CT ABDOMEN PELVIS W CONTRAST  Result Date: 03/15/2022 CLINICAL DATA:  Abdominal pain.  Status post hernia repair mesh. EXAM:  CT ABDOMEN AND PELVIS WITH CONTRAST TECHNIQUE: Multidetector CT imaging of the abdomen and pelvis was performed using the standard protocol following bolus administration of intravenous contrast. RADIATION DOSE REDUCTION: This exam was performed according to the departmental dose-optimization program which includes automated exposure control, adjustment of the mA and/or kV according to patient size and/or use of iterative reconstruction technique. CONTRAST:  OMNIPAQUE IOHEXOL 300 MG/ML  SOLN COMPARISON:  None Available. FINDINGS: Lower chest: There are bibasilar subpleural atelectasis. Pneumonia is not excluded clinical correlation is recommended. Small pneumoperitoneum, postoperative.  No free fluid. Hepatobiliary: No focal liver abnormality is seen. No gallstones, gallbladder wall thickening, or biliary dilatation. Pancreas: Unremarkable. No pancreatic  ductal dilatation or surrounding inflammatory changes. Spleen: Normal in size without focal abnormality. Adrenals/Urinary Tract: The adrenal glands are unremarkable. Focal area of parenchymal atrophy and scarring in the interpolar left kidney. There is no hydronephrosis on either side. There is symmetric enhancement and excretion of contrast by both kidneys. The visualized ureters and urinary bladder appear unremarkable. Stomach/Bowel: There is no bowel obstruction or active inflammation. The appendix is normal. Vascular/Lymphatic: Moderate aortoiliac atherosclerotic disease. The IVC is unremarkable. No portal venous gas. There is no adenopathy. Reproductive: The prostate and seminal vesicles are grossly unremarkable. No pelvic mass Other: Postsurgical changes of anterior abdominal wall and ventral hernia repair with mesh. Small fat containing right inguinal hernia. Musculoskeletal: No acute or significant osseous findings. IMPRESSION: 1. No bowel obstruction. Normal appendix. 2. Small pneumoperitoneum, postoperative. 3. Bibasilar subpleural atelectasis.  Pneumonia is not excluded clinical correlation is recommended. 4. Aortic Atherosclerosis (ICD10-I70.0). Electronically Signed   By: Elgie Collard M.D.   On: 03/15/2022 02:32   DG Chest Port 1 View  Result Date: 03/15/2022 CLINICAL DATA:  Questionable sepsis. EXAM: PORTABLE CHEST 1 VIEW COMPARISON:  Chest radiograph dated 04/01/2021. FINDINGS: There is mild chronic interstitial coarsening and bibasilar atelectasis/scarring. No focal consolidation, pleural effusion, pneumothorax. The cardiac silhouette is within normal limits. No acute osseous pathology. IMPRESSION: No active disease. Electronically Signed   By: Elgie Collard M.D.   On: 03/15/2022 00:48    Microbiology: Recent Results (from the past 240 hour(s))  Blood Culture (routine x 2)     Status: None (Preliminary result)   Collection Time: 03/14/22 11:28 PM   Specimen: BLOOD  Result Value Ref Range Status   Specimen Description   Final    BLOOD RIGHT ANTECUBITAL Performed at [facility], [address] [place], [place] [address]    Special Requests   Final    BOTTLES DRAWN AEROBIC AND ANAEROBIC Blood Culture adequate volume Performed at [facility], [address], [place], [place] [address]    Culture   Final    NO GROWTH 2 DAYS Performed at [facility], [address] [place], [place] [address]    Report Status PENDING  Incomplete  Blood Culture (routine x 2)     Status: None (Preliminary result)   Collection Time: 03/14/22 11:40 PM   Specimen: BLOOD  Result Value Ref Range Status   Specimen Description   Final    BLOOD LEFT ANTECUBITAL Performed at [facility], [address], [place], [place] [address]    Special Requests   Final    BOTTLES DRAWN AEROBIC AND ANAEROBIC Blood Culture adequate volume Performed at [facility], [address] [place], [place] [address]    Culture   Final    NO GROWTH 2 DAYS Performed at [facility], [address] [place], [place] [address]    Report Status PENDING  Incomplete     Labs: Basic Metabolic Panel: Recent Labs  Lab 03/12/22 1258 03/14/22 2340 03/15/22 0600  NA 141 139 138  K 4.4 4.1 3.8  CL 108 103 105  CO2 27 27 27   GLUCOSE 91 119* 100*  BUN 9 7 7   CREATININE 0.94 0.99 0.94  CALCIUM 9.9 9.6 8.8*  MG  --   --  1.4*  PHOS  --   --  3.7   Liver Function Tests: Recent Labs  Lab 03/12/22 1258 03/14/22 2340  AST 43* 43*  ALT 33 33  ALKPHOS 63 68  BILITOT 0.5 0.9  PROT 7.3 7.8  ALBUMIN 4.0 4.3   No results for input(s): "LIPASE", "AMYLASE" in the last 168 hours. No results for input(s): "AMMONIA" in the last 168 hours. CBC: Recent Labs  Lab 03/12/22 1258 03/14/22 1128 03/14/22 2340 03/15/22 0600  WBC 7.7 9.1 15.2* 13.3*  NEUTROABS 5.3  --  12.8*  --   HGB 16.0 15.2 15.7 15.1  HCT 47.1 44.9 46.0 44.4  MCV 100.4* 101.6* 100.4* 101.4*  PLT 251 247 268 228   Cardiac Enzymes: No results for input(s): "CKTOTAL", "CKMB", "CKMBINDEX", "TROPONINI" in the last 168 hours. BNP: BNP (last 3 results) No results for input(s): "BNP" in the last 8760 hours.  ProBNP (last 3 results) No results for input(s): "PROBNP" in the last 8760 hours.  CBG: No results for input(s): "GLUCAP" in the last 168 hours.  Principal Problem:   Umbilical hernia Active Problems:   Chronic pain syndrome   GERD (gastroesophageal reflux disease)   Tobacco abuse   Seasonal allergies   GAD (generalized anxiety disorder)   H/O hernia repair   Time coordinating discharge: 25 min  Signed:  Atilano Ina, MD [facility], [place] [phone] 03/17/2022, 2:51 PM

## 2022-03-17 NOTE — TOC CM/SW Note (Signed)
  Transition of Care [facility]) Screening Note   Patient Details  Name: Carlos Montoya Date of Birth: March 14, 1961   Transition [facility]) CM/SW Contact:    Darleene Cleaver, LCSW Phone Number: 03/17/2022, 9:39 AM    Transition of Care Department [facility]) has reviewed patient and no TOC needs have been identified at this time. We will continue to monitor patient advancement through interdisciplinary progression rounds. If new patient transition needs arise, please place a TOC consult.

## 2022-03-17 NOTE — Progress Notes (Signed)
Discharge instructions given to patient oral and written. Patient personal belongings with patient at time of d/c. Patient in stable condition.

## 2022-03-17 NOTE — Discharge Instructions (Signed)
[facility] LAPAROSCOPIC SURGERY: POST OP INSTRUCTIONS Always review your discharge instruction sheet given to you by the facility where your surgery was performed. IF YOU HAVE DISABILITY OR FAMILY LEAVE FORMS, YOU MUST BRING THEM TO THE OFFICE FOR PROCESSING.   DO NOT GIVE THEM TO YOUR DOCTOR.  PAIN CONTROL  First take acetaminophen (Tylenol) AND/or ibuprofen (Advil) to control your pain after surgery.  Follow directions on package.  Taking acetaminophen (Tylenol) and/or ibuprofen (Advil) regularly after surgery will help to control your pain and lower the amount of prescription pain medication you may need.  You should not take more than 3,000 mg (3 grams) of acetaminophen (Tylenol) in 24 hours.  You should not take ibuprofen (Advil), aleve, motrin, naprosyn or other NSAIDS if you have a history of stomach ulcers or chronic kidney disease.  A prescription for pain medication may be given to you upon discharge.  Take your pain medication as prescribed, if you still have uncontrolled pain after taking acetaminophen (Tylenol) or ibuprofen (Advil). Use ice packs to help control pain. If you need a refill on your pain medication, please contact your pharmacy.  They will contact our office to request authorization. Prescriptions will not be filled after 5pm or on week-ends.  HOME MEDICATIONS Take your usually prescribed medications unless otherwise directed.  DIET You should follow a light diet the first few days after arrival home.  Be sure to include lots of fluids daily. Avoid fatty, fried foods.   CONSTIPATION It is common to experience some constipation after surgery and if you are taking pain medication.  Increasing fluid intake and taking a stool softener (such as Colace) will usually help or prevent this problem from occurring.  A mild laxative (Milk of Magnesia or Miralax) should be taken according to package instructions if there are no bowel movements after 48  hours.  WOUND/INCISION CARE Most patients will experience some swelling and bruising in the area of the incisions.  Ice packs will help.  Swelling and bruising can take several days to resolve.  Unless discharge instructions indicate otherwise, follow guidelines below  STERI-STRIPS - you may remove your outer bandages 48 hours after surgery, and you may shower at that time.  You have steri-strips (small skin tapes) in place directly over the incision.  These strips should be left on the skin for 7-10 days.   DERMABOND/SKIN GLUE - you may shower in 24 hours.  The glue will flake off over the next 2-3 weeks. Any sutures or staples will be removed at the office during your follow-up visit.  ACTIVITIES You may resume regular (light) daily activities beginning the next day--such as daily self-care, walking, climbing stairs--gradually increasing activities as tolerated.  You may have sexual intercourse when it is comfortable.  Refrain from any heavy lifting or straining until approved by your doctor. You may drive when you are no longer taking prescription pain medication, you can comfortably wear a seatbelt, and you can safely maneuver your car and apply brakes.  FOLLOW-UP You should see your doctor in the office for a follow-up appointment approximately 2-3 weeks after your surgery.  You should have been given your post-op/follow-up appointment when your surgery was scheduled.  If you did not receive a post-op/follow-up appointment, make sure that you call for this appointment within a day or two after you arrive home to insure a convenient appointment time.  OTHER INSTRUCTIONS Wear abdominal binder during daytime Take omeprazole while taking ibuprofen  WHEN TO CALL  YOUR DOCTOR: Fever over 101.0 Inability to urinate Continued bleeding from incision. Increased pain, redness, or drainage from the incision. Increasing abdominal pain  The clinic staff is available to answer your questions during  regular business hours.  Please don't hesitate to call and ask to speak to one of the nurses for clinical concerns.  If you have a medical emergency, go to the nearest emergency room or call 911.  A surgeon from [facility] is always on call at the hospital. [address], [place], [place]  [address] ? [address] [address], [place], [place]   [address] [phone] ? [phone] ? FAX [phone] Web site: [email or web address]

## 2022-03-20 LAB — CULTURE, BLOOD (ROUTINE X 2)
Culture: NO GROWTH
Culture: NO GROWTH
Special Requests: ADEQUATE
Special Requests: ADEQUATE

## 2022-03-27 ENCOUNTER — Encounter (HOSPITAL_COMMUNITY): Payer: Medicaid Other | Admitting: Student

## 2022-03-27 ENCOUNTER — Ambulatory Visit (INDEPENDENT_AMBULATORY_CARE_PROVIDER_SITE_OTHER): Payer: Medicaid Other | Admitting: Student

## 2022-03-27 VITALS — BP 120/79 | HR 95 | Temp 98.8°F | Ht 71.0 in | Wt 150.6 lb

## 2022-03-27 DIAGNOSIS — F331 Major depressive disorder, recurrent, moderate: Secondary | ICD-10-CM | POA: Diagnosis not present

## 2022-03-27 MED ORDER — MIRTAZAPINE 30 MG PO TABS: 30.0000 mg | ORAL_TABLET | Freq: Every day | ORAL | 2 refills | Status: DC

## 2022-03-28 NOTE — Progress Notes (Signed)
BH MD Outpatient Progress Note  03/28/2022 8:50 AM Carlos Montoya  MRN:  [id number]  Assessment:  Carlos Montoya presents for follow-up evaluation in-person. Today, 03/28/22, patient reports unchanged mood symptoms since his last visit, when he was started on Remeron 15 mg.  Identifying Information: Carlos Montoya is a 61 y.o. y.o. male with a history of major depressive disorder and generalized anxiety disorder, as well as a medical history of hepatitis C s/p treatment, and umbilical hernia s/p recent repair who is an established patient with [facility] for management of depression and anxiety.   Plan:  #Major depressive disorder  irritability, anger Past medication trials: Prozac 20 mg for years and Zoloft (patient unsure about dosage). Status of problem: acute Interventions: Increase Remeron to 30 mg nightly for depression R/B/SE to this medication were discussed Patient to follow-up in 5 weeks Future medications may include lamotrigine, alpha-2 agonist, SNRIs.    # Generalized Anxiety Disorder Past medication trials: As above Status of problem: New problem for this provider Interventions: As above -- Front desk to schedule therapy for the patient  Patient was given contact information for behavioral health clinic and was instructed to call 911 for emergencies.   Subjective:  Chief Complaint:  Chief Complaint  Patient presents with   Depression    Interval History:   On interview and assessment today, the patient reports a recent hernia repair which involved significant postoperative pain.  He reports that he had to return to the emergency department to be admitted for pain management.  States that currently his pain is a 2 out of 10 but that he has some reduced appetite as a result.  He reports that his mood is relatively unchanged and that his sleep did not improve with either the 7.5 mg of the 15 mg dose of Remeron.  He reports  taking his medication each day.  He states that he still finds himself thinking about death frequently.  He vehemently denies having any suicidal thoughts and states he has a strong will to live.  He denies experiencing any auditory or visual hallucinations or homicidal thoughts.  He reports continued interest in returning to work.  He used to previously work as a Hospital doctorphlebotomy tech.  He states that his irritability and anger continue to be barriers for him returning to work.  Visit Diagnosis:    ICD-10-CM   1. Major depressive disorder, recurrent episode, moderate (HCC)  F33.1       Past Psychiatric History: as above  Past Medical History:  Past Medical History:  Diagnosis Date   Acid reflux    Alcohol use disorder, mild, abuse    Anxiety    Bipolar disorder (HCC)    COPD (chronic obstructive pulmonary disease) (HCC)    Depression    History of hepatitis C 2016   treated   Opioid dependence (HCC)    Pneumonia    Umbilical hernia     Past Surgical History:  Procedure Laterality Date   COLONOSCOPY     HERNIA REPAIR     TONSILLECTOMY     UMBILICAL HERNIA REPAIR N/A 03/14/2022   Procedure: LAPAROSCOPIC ASSISTED REPAIR UMBILICAL HERNIA WITH MESH;  Surgeon: Gaynelle AduWilson, Eric, MD;  Location: [facility];  Service: General;  Laterality: N/A;    Family Psychiatric History: Per initial note  Family History:  Family History  Problem Relation Age of Onset   Diabetes Mother    Heart disease Father    Colon cancer Father  Social History:  Social History   Socioeconomic History   Marital status: Single    Spouse name: Not on file   Number of children: Not on file   Years of education: Not on file   Highest education level: Not on file  Occupational History   Not on file  Tobacco Use   Smoking status: Every Day    Packs/day: 0.50    Types: Cigarettes   Smokeless tobacco: Never  Vaping Use   Vaping Use: Never used  Substance and Sexual Activity   Alcohol use: Yes    Comment: 1  beer today   Drug use: No   Sexual activity: Not on file  Other Topics Concern   Not on file  Social History Narrative   Not on file   Social Determinants of Health   Financial Resource Strain: Not on file  Food Insecurity: Not on file  Transportation Needs: Not on file  Physical Activity: Not on file  Stress: Not on file  Social Connections: Not on file    Allergies:  Allergies  Allergen Reactions   Prednisone Hives   Diphenhydramine Hcl Other (See Comments)    Has opposite effect   Tylenol [Acetaminophen] Other (See Comments)    Told to avoid due to having hepatitis C previously    Current Medications: Current Outpatient Medications  Medication Sig Dispense Refill   albuterol (VENTOLIN HFA) 108 (90 Base) MCG/ACT inhaler Inhale 2 puffs into the lungs every 6 (six) hours as needed for wheezing or shortness of breath.     aspirin EC 81 MG tablet Take 81 mg by mouth daily.     ibuprofen (ADVIL) 600 MG tablet Take 600 mg by mouth every 6 (six) hours as needed for mild pain.     methocarbamol 1000 MG TABS Take 1,000 mg by mouth 3 (three) times daily for 14 days. 42 tablet 0   MILK THISTLE PO Take 1 tablet by mouth daily.     mirtazapine (REMERON) 30 MG tablet Take 1 tablet (30 mg total) by mouth at bedtime. 30 tablet 2   atorvastatin (LIPITOR) 20 MG tablet Take 1 tablet (20 mg total) by mouth daily. (Patient not taking: Reported on 03/15/2022) 90 tablet 3   Carboxymethylcellul-Glycerin (LUBRICATING EYE DROPS OP) Place 1 drop into both eyes daily as needed (burning eyes). (Patient not taking: Reported on 03/27/2022)     cetirizine (ZYRTEC ALLERGY) 10 MG tablet Take 1 tablet (10 mg total) by mouth daily. (Patient taking differently: Take 10 mg by mouth daily as needed for allergies.) 30 tablet 0   FLOVENT HFA 44 MCG/ACT inhaler INHALE 2 PUFFS INTO THE LUNGS TWICE A DAY (Patient taking differently: Inhale 2 puffs into the lungs daily as needed (wheezing, shortness of breath).) 10.6 each  12   fluticasone (FLONASE) 50 MCG/ACT nasal spray Place 1-2 sprays into both nostrils daily as needed for allergies.     gabapentin (NEURONTIN) 300 MG capsule Take 1 capsule (300 mg total) by mouth 3 (three) times daily. (Patient not taking: Reported on 03/27/2022) 90 capsule 0   omeprazole (PRILOSEC) 20 MG capsule TAKE 1 CAPSULE BY MOUTH EVERY DAY (Patient taking differently: Take 20 mg by mouth daily.) 90 capsule 1   oxyCODONE (OXY IR/ROXICODONE) 5 MG immediate release tablet Take 1 tablet (5 mg total) by mouth every 6 (six) hours as needed for severe pain. (Patient not taking: Reported on 03/27/2022) 15 tablet 0   polyethylene glycol (MIRALAX / GLYCOLAX) 17 g packet Take  17 g by mouth every 12 (twelve) hours as needed for mild constipation, moderate constipation or severe constipation. 14 each 0   No current facility-administered medications for this visit.    Psychiatric Specialty Exam: Review of Systems  Respiratory:  Negative for shortness of breath.   Cardiovascular:  Negative for chest pain.  Gastrointestinal:  Negative for constipation, diarrhea, nausea and vomiting. Positive for mild ABD pain.  Neurological:  Negative for headaches.       BP 120/79   Pulse 95   Temp 98.8 F (37.1 C)   Wt 150 lb 9.6 oz (68.3 kg)   SpO2 95%   BMI 21.00 kg/m    General Appearance: Fairly Groomed  Eye Contact:  Good  Speech:  Clear and Coherent  Volume:  Normal  Mood:  depressed, irritable  Affect:  Congruent  Thought Process:  Coherent  Orientation:  Full (Time, Place, and Person)  Thought Content: Logical   Suicidal Thoughts:  No  Homicidal Thoughts:  No  Memory:  Immediate;   Good  Judgement:  fair  Insight: fair  Psychomotor Activity:  Normal  Concentration:  Concentration: Good  Recall:  Good  Fund of Knowledge: Good  Language: Good  Akathisia:  No  Handed:    AIMS (if indicated): not done  Assets:  Communication Skills Desire for Improvement Financial  Resources/Insurance Housing Leisure Time Physical Health  ADL's:  Intact  Cognition: WNL  Sleep:  Fair    PE: General: well-appearing; no acute distress  Pulm: no increased work of breathing on room air  Strength & Muscle Tone: within normal limits Neuro: no focal neurological deficits observed  Gait & Station: normal  Metabolic Disorder Labs: No results found for: "HGBA1C", "MPG" No results found for: "PROLACTIN" Lab Results  Component Value Date   CHOL 138 11/06/2021   TRIG 105 11/06/2021   HDL 52 11/06/2021   CHOLHDL 2.7 11/06/2021   LDLCALC 67 11/06/2021   LDLCALC 114 (H) 09/13/2020   Lab Results  Component Value Date   TSH 1.410 09/13/2020    Therapeutic Level Labs: No results found for: "LITHIUM" No results found for: "VALPROATE" No results found for: "CBMZ"  Screenings: PHQ2-9    Flowsheet Row Office Visit from 02/27/2022 in [facility] Office Visit from 09/13/2020 in [facility] Office Visit from 02/25/2019 in [facility] Office Visit from 11/30/2018 in [facility] Office Visit from 12/11/2017 in [facility]  PHQ-2 Total Score 1 5 1 1  0  PHQ-9 Total Score -- 19 -- -- --      Flowsheet Row ED to Hosp-Admission (Discharged) from 03/14/2022 in [facility] General Surgery ED from 04/01/2021 in [facility]  C-SSRS RISK CATEGORY No Risk No Risk       Collaboration of Care: Collaboration of Care: Other none necessary  Patient/Guardian was advised Release of Information must be obtained prior to any record release in order to collaborate their care with an outside provider. Patient/Guardian was advised if they have not already done so to contact the registration department to sign all necessary forms in order for 04/03/2021 to release information regarding their care.   Consent: Patient/Guardian gives verbal consent for treatment  and assignment of benefits for services provided during this visit. Patient/Guardian expressed understanding and agreed to proceed.   A total of 30 minutes was spent involved in face to face clinical care, chart review,  documentation.   Corky Sox, MD 03/28/2022, 8:50 AM

## 2022-04-02 ENCOUNTER — Other Ambulatory Visit: Payer: Self-pay | Admitting: Family Medicine

## 2022-05-01 ENCOUNTER — Encounter (HOSPITAL_COMMUNITY): Payer: Self-pay | Admitting: Student

## 2022-05-01 ENCOUNTER — Ambulatory Visit (INDEPENDENT_AMBULATORY_CARE_PROVIDER_SITE_OTHER): Payer: Medicaid Other | Admitting: Student

## 2022-05-01 DIAGNOSIS — F3132 Bipolar disorder, current episode depressed, moderate: Secondary | ICD-10-CM | POA: Diagnosis not present

## 2022-05-01 MED ORDER — LAMOTRIGINE 25 MG PO TABS: ORAL_TABLET | ORAL | 1 refills | Status: DC

## 2022-05-01 NOTE — Progress Notes (Signed)
[facility] MD Outpatient Progress Note  05/02/2022 7:04 AM [name]  MRN:  [id number]  Assessment:  Carlos Montoya presents for follow-up evaluation in-person. Today, 05/02/22, patient reports unchanged mood symptoms since his last visit, when his Remeron was increased from 15 mg to 30 mg. We will consider this a failed trial given that the patient has reported consistent compliance with no benefit to his mood or sleep. We are considering that the patient may have bipolar depression based on his treatment refractory depression and his report of a manic episode many years ago. It is notable that the patient reports his previous psychiatrist of many years diagnosed him with Bipolar II. We will start lamotrigine for bipolar depression. This medication may also help with the patient's irritability.   Identifying Information: Carlos Montoya is a 61 y.o. y.o. male with a history of major depressive disorder and generalized anxiety disorder, as well as a medical history of hepatitis C s/p treatment, and umbilical hernia s/p recent repair who is an established patient with [facility] for management of depression and anxiety.   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  Status of problem: acute Interventions: Start lamotrigine 25 mg daily for two weeks, then increase to 50 mg daily for two weeks R/B/SE to this medication were discussed, including risk of SJS and need to contact clinic in case of a rash Patient to follow-up in 4 weeks Future medications may include Prazosin for nightmares or Effexor (may help with pain).     # Generalized Anxiety Disorder Past medication trials: As above Status of problem: New problem for this provider Interventions: As above -- Front desk to schedule therapy for the patient  Patient was given  contact information for behavioral health clinic and was instructed to call 911 for emergencies.   Subjective:  Chief Complaint:  Chief Complaint  Patient presents with   Depression    Interval History:  On interview and assessment today, the patient reports no improvement in mood or sleep from the Remeron 30 mg over the past 4 weeks. He denies suicidal thoughts but reports a stubbornly depressed mood and poor sleep.  The patient shared that his former psychiatrist at [facility] diagnosed him with PTSD and bipolar 2.  He is unsure what medications he has tried previously for this.  He is asked about previous manic episodes.  He reports that when he was younger he spent a month in a psychiatric hospital because "I thought I was a prophet".  Discussed the potential utility of lamotrigine.  The patient is interested.  Discussed the risk of severe rash and the need for monitoring on the part of the patient during titration.  Discussed that if the patient stops taking the medication for 4 days or more, that he should contact us instead of restarting the medication.  The patient was interested in trying the medication.  He reports he would not be able to pick it up until next week.  The patient also reported significant nightmares at this visit.  He reports that these are disturbing and bother his sleep.  Discussed prazosin for the patient's nightmares.  The patient elected to focus on 1 medication at a time.  Discussed again with the patient that therapy would be helpful.  The patient wishes to defer this discussion until the next visit.  Visit Diagnosis:  ICD-10-CM   1. Bipolar affective disorder, currently depressed, moderate (HCC)  F31.32 lamoTRIgine (LAMICTAL) 25 MG tablet       Past Psychiatric History: as above  Past Medical History:  Past Medical History:  Diagnosis Date   Acid reflux    Alcohol use disorder, mild, abuse    Anxiety    Bipolar disorder ([place])    COPD (chronic  obstructive pulmonary disease) ([place])    Depression    History of hepatitis C 2016   treated   Opioid dependence ([place])    Pneumonia    Umbilical hernia     Past Surgical History:  Procedure Laterality Date   COLONOSCOPY     HERNIA REPAIR     TONSILLECTOMY     UMBILICAL HERNIA REPAIR N/A 03/14/2022   Procedure: LAPAROSCOPIC ASSISTED REPAIR UMBILICAL HERNIA WITH MESH;  Surgeon: Greer Pickerel, MD;  Location: [facility];  Service: General;  Laterality: N/A;    Family Psychiatric History: Per initial note  Family History:  Family History  Problem Relation Age of Onset   Diabetes Mother    Heart disease Father    Colon cancer Father     Social History:  Social History   Socioeconomic History   Marital status: Single    Spouse name: Not on file   Number of children: Not on file   Years of education: Not on file   Highest education level: Not on file  Occupational History   Not on file  Tobacco Use   Smoking status: Every Day    Packs/day: 0.50    Types: Cigarettes   Smokeless tobacco: Never  Vaping Use   Vaping Use: Never used  Substance and Sexual Activity   Alcohol use: Yes    Comment: 1 beer today   Drug use: No   Sexual activity: Not on file  Other Topics Concern   Not on file  Social History Narrative   Not on file   Social Determinants of Health   Financial Resource Strain: Not on file  Food Insecurity: Not on file  Transportation Needs: Not on file  Physical Activity: Not on file  Stress: Not on file  Social Connections: Not on file    Allergies:  Allergies  Allergen Reactions   Prednisone Hives   Diphenhydramine Hcl Other (See Comments)    Has opposite effect   Tylenol [Acetaminophen] Other (See Comments)    Told to avoid due to having hepatitis C previously    Current Medications: Current Outpatient Medications  Medication Sig Dispense Refill   aspirin EC 81 MG tablet Take 81 mg by mouth daily.     FLOVENT HFA 44 MCG/ACT inhaler INHALE 2 PUFFS  INTO THE LUNGS TWICE A DAY (Patient taking differently: Inhale 2 puffs into the lungs daily as needed (wheezing, shortness of breath).) 10.6 each 12   fluticasone (FLONASE) 50 MCG/ACT nasal spray Place 1-2 sprays into both nostrils daily as needed for allergies.     ibuprofen (ADVIL) 600 MG tablet Take 600 mg by mouth every 6 (six) hours as needed for mild pain.     lamoTRIgine (LAMICTAL) 25 MG tablet Take 1 tablet (25 mg total) daily for 2 weeks, then take 2 tablets (50 mg total) daily thereafter 90 tablet 1   MILK THISTLE PO Take 1 tablet by mouth daily.     mirtazapine (REMERON) 30 MG tablet Take 1 tablet (30 mg total) by mouth at bedtime. 30 tablet 2   omeprazole (PRILOSEC) 20 MG capsule  TAKE 1 CAPSULE BY MOUTH EVERY DAY (Patient taking differently: Take 20 mg by mouth daily.) 90 capsule 1   VENTOLIN HFA 108 (90 Base) MCG/ACT inhaler INHALE 2 PUFFS BY MOUTH EVERY 6 HOURS AS NEEDED 18 each 0   atorvastatin (LIPITOR) 20 MG tablet Take 1 tablet (20 mg total) by mouth daily. (Patient not taking: Reported on 03/15/2022) 90 tablet 3   Carboxymethylcellul-Glycerin (LUBRICATING EYE DROPS OP) Place 1 drop into both eyes daily as needed (burning eyes). (Patient not taking: Reported on 03/27/2022)     cetirizine (ZYRTEC ALLERGY) 10 MG tablet Take 1 tablet (10 mg total) by mouth daily. (Patient not taking: Reported on 05/01/2022) 30 tablet 0   gabapentin (NEURONTIN) 300 MG capsule Take 1 capsule (300 mg total) by mouth 3 (three) times daily. (Patient not taking: Reported on 03/27/2022) 90 capsule 0   oxyCODONE (OXY IR/ROXICODONE) 5 MG immediate release tablet Take 1 tablet (5 mg total) by mouth every 6 (six) hours as needed for severe pain. (Patient not taking: Reported on 03/27/2022) 15 tablet 0   polyethylene glycol (MIRALAX / GLYCOLAX) 17 g packet Take 17 g by mouth every 12 (twelve) hours as needed for mild constipation, moderate constipation or severe constipation. (Patient not taking: Reported on 05/01/2022)  14 each 0   No current facility-administered medications for this visit.    Psychiatric Specialty Exam: Review of Systems  Respiratory:  Negative for shortness of breath.   Cardiovascular:  Negative for chest pain.  Gastrointestinal:  Negative for constipation, diarrhea, nausea and vomiting. Positive for mild ABD pain.  Neurological:  Negative for headaches.       BP 130/86   Pulse 82   Wt 152 lb (68.9 kg)   BMI 21.20 kg/m    General Appearance: Fairly Groomed  Eye Contact:  Good  Speech:  Clear and Coherent  Volume:  Normal  Mood:  depressed, irritable  Affect:  Congruent  Thought Process:  Coherent  Orientation:  Full (Time, Place, and Person)  Thought Content: Logical   Suicidal Thoughts:  No  Homicidal Thoughts:  No  Memory:  Immediate;   Good  Judgement:  fair  Insight: fair  Psychomotor Activity:  Normal  Concentration:  Concentration: Good  Recall:  Good  Fund of Knowledge: Good  Language: Good  Akathisia:  No  Handed:    AIMS (if indicated): not done  Assets:  Communication Skills Desire for Improvement Financial Resources/Insurance Housing Leisure Time Physical Health  ADL's:  Intact  Cognition: WNL  Sleep:  Fair    PE: General: well-appearing; no acute distress  Pulm: no increased work of breathing on room air  Strength & Muscle Tone: within normal limits Neuro: no focal neurological deficits observed  Gait & Station: normal  Metabolic Disorder Labs: No results found for: "HGBA1C", "MPG" No results found for: "PROLACTIN" Lab Results  Component Value Date   CHOL 138 11/06/2021   TRIG 105 11/06/2021   HDL 52 11/06/2021   CHOLHDL 2.7 11/06/2021   LDLCALC 67 11/06/2021   LDLCALC 114 (H) 09/13/2020   Lab Results  Component Value Date   TSH 1.410 09/13/2020    Therapeutic Level Labs: No results found for: "LITHIUM" No results found for: "VALPROATE" No results found for: "CBMZ"  Screenings: [facility] Row Office Visit from  02/27/2022 in [facility] Office Visit from 09/13/2020 in [facility] Office Visit from 02/25/2019 in [employer] Office Visit  from 11/30/2018 in [place] [facility] Office Visit from 12/11/2017 in [facility]  PHQ-2 Total Score 1 5 1 1  0  PHQ-9 Total Score -- 19 -- -- --      Flowsheet Row ED to Hosp-Admission (Discharged) from 03/14/2022 in [facility] from 04/01/2021 in [place] [facility] DEPT  C-SSRS RISK CATEGORY No Risk No Risk       Collaboration of Care: Collaboration of Care: Other none necessary  Patient/Guardian was advised Release of Information must be obtained prior to any record release in order to collaborate their care with an outside provider. Patient/Guardian was advised if they have not already done so to contact the registration department to sign all necessary forms in order for [place] to release information regarding their care.   Consent: Patient/Guardian gives verbal consent for treatment and assignment of benefits for services provided during this visit. Patient/Guardian expressed understanding and agreed to proceed.   A total of 30 minutes was spent involved in face to face clinical care, chart review, documentation.   Corky Sox, MD 05/02/2022, 7:04 AM

## 2022-05-02 ENCOUNTER — Encounter (HOSPITAL_COMMUNITY): Payer: Self-pay | Admitting: Student

## 2022-05-07 ENCOUNTER — Other Ambulatory Visit: Payer: Self-pay | Admitting: Family Medicine

## 2022-05-20 ENCOUNTER — Ambulatory Visit: Payer: Medicaid Other | Admitting: Family Medicine

## 2022-06-05 ENCOUNTER — Encounter (HOSPITAL_COMMUNITY): Payer: Medicaid Other | Admitting: Student

## 2022-06-09 ENCOUNTER — Ambulatory Visit (INDEPENDENT_AMBULATORY_CARE_PROVIDER_SITE_OTHER): Payer: Medicaid Other | Admitting: Family Medicine

## 2022-06-09 ENCOUNTER — Encounter: Payer: Self-pay | Admitting: Family Medicine

## 2022-06-09 VITALS — BP 112/70 | HR 68 | Temp 97.6°F | Ht 70.0 in | Wt 154.8 lb

## 2022-06-09 DIAGNOSIS — G894 Chronic pain syndrome: Secondary | ICD-10-CM

## 2022-06-09 DIAGNOSIS — R252 Cramp and spasm: Secondary | ICD-10-CM | POA: Insufficient documentation

## 2022-06-09 DIAGNOSIS — G8929 Other chronic pain: Secondary | ICD-10-CM

## 2022-06-09 DIAGNOSIS — F17200 Nicotine dependence, unspecified, uncomplicated: Secondary | ICD-10-CM

## 2022-06-09 DIAGNOSIS — Z720 Tobacco use: Secondary | ICD-10-CM | POA: Diagnosis not present

## 2022-06-09 DIAGNOSIS — J4540 Moderate persistent asthma, uncomplicated: Secondary | ICD-10-CM

## 2022-06-09 DIAGNOSIS — Z8719 Personal history of other diseases of the digestive system: Secondary | ICD-10-CM

## 2022-06-09 DIAGNOSIS — Z9889 Other specified postprocedural states: Secondary | ICD-10-CM

## 2022-06-09 MED ORDER — DEBROX 6.5 % OT SOLN: 5.0000 [drp] | Freq: Two times a day (BID) | OTIC | 0 refills | Status: DC

## 2022-06-09 MED ORDER — ALBUTEROL SULFATE HFA 108 (90 BASE) MCG/ACT IN AERS: 2.0000 | INHALATION_SPRAY | Freq: Four times a day (QID) | RESPIRATORY_TRACT | 0 refills | Status: DC | PRN

## 2022-06-09 NOTE — Assessment & Plan Note (Signed)
Lung cancer screening ordered. Has tried patches in the past without success. He is interested in cessation. Information provided in AVS for smoking cessation hotline and methods to help with cessation. Also recommend that he also follow with Dr. Raymondo Band.

## 2022-06-09 NOTE — Assessment & Plan Note (Signed)
Recommend f/u with his surgeon as scheduled. Question if paraesthesias are from meralgia paraesthetica.

## 2022-06-09 NOTE — Assessment & Plan Note (Signed)
Albuterol refilled today. No PFT on file, would benefit from them. May have component of Asthma/COPD. Scheduled for PFT with Dr. Raymondo Band next week. Recommended smoking cessation, information provided in AVS.

## 2022-06-09 NOTE — Assessment & Plan Note (Signed)
Referral placed to pain management per patient request. Previously followed by [facility] internal medicine clinic for his chronic pain but was discharged given broken pain contract.

## 2022-06-09 NOTE — Progress Notes (Signed)
SUBJECTIVE:   Chief compliant/HPI: annual examination  Carlos Montoya is a 61 y.o. who presents today for an annual exam.  Current Concerns: Reports that he has an upcoming appointment with Dr. Andrey Campanile for follow up on his recent hernia repair. Still having pain to the area of surgery. 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 He has tried Lidocaine patch and Gabapentin which don't seem to help. He also notes frequent cramping of his legs. States that he was supposed to go to pain management clinic but was unable to make his appointment. Would like another referral.   History tabs reviewed and updated.    OBJECTIVE:   BP 112/70   Pulse 68   Temp 97.6 F (36.4 C)   Ht 5\' 10"  (1.778 m)   Wt 154 lb 12.8 oz (70.2 kg)   SpO2 93%   BMI 22.21 kg/m   General: Awake, alert, oriented, in no acute distress, pleasant and cooperative with examination HEENT: Normocephalic, atraumatic, nares patent, dentition is fair, oropharynx without erythema or exudates, b/l auditory canals with cerumen but not impacted, no thyroid nodules palpated Cardio: RRR without murmur, 2+ radial pulses b/l Respiratory: CTAB without wheezing/rhonchi/rales Abdomen: Soft, non-tender to palpation of all quadrants, non-distended MSK: Able to move all extremities spontaneously, good muscle strength Extremities: without edema or cyanosis Neuro: Speech is clear and intact, no focal deficits, no facial asymmetry, follows commands  Psych: Normal mood and affect     06/09/2022   11:48 AM 02/27/2022    2:13 PM 09/13/2020    2:27 PM  Depression screen PHQ 2/9  Decreased Interest 3 0 3  Down, Depressed, Hopeless 3 1 2   PHQ - 2 Score 6 1 5   Altered sleeping 3  3  Tired, decreased energy 3  3  Change in appetite 1  1  Feeling bad or failure about yourself  3  3  Trouble concentrating 3  2  Moving slowly or fidgety/restless 1  2  Suicidal thoughts 0  0  PHQ-9 Score  20  19  Difficult doing work/chores Extremely dIfficult  Very difficult    ASSESSMENT/PLAN:   Asthma Albuterol refilled today. No PFT on file, would benefit from them. May have component of Asthma/COPD. Scheduled for PFT with Dr. 4/9 next week. Recommended smoking cessation, information provided in AVS.  Chronic pain syndrome Referral placed to pain management per patient request. Previously followed by [facility] internal medicine clinic for his chronic pain but was discharged given broken pain contract.  H/O hernia repair Recommend f/u with his surgeon as scheduled. Question if paraesthesias are from meralgia paraesthetica.   Leg cramping Will check electrolytes and Mg today. Last Mg level in September was low. Last CBC post-op in September was normal.  Tobacco use disorder Lung cancer screening ordered. Has tried patches in the past without success. He is interested in cessation. Information provided in AVS for smoking cessation hotline and methods to help with cessation. Also recommend that he also follow with [facility]. [facility].     Annual Examination  See AVS for age appropriate recommendations.  PHQ score 20, reviewed and discussed.  Blood pressure value is at goal, discussed.   Considered the following screening exams based upon USPSTF recommendations: Diabetes screening:  not ordered Screening for elevated cholesterol:  checked in May, LDL 67 HIV testing:  declined Hepatitis C: discussed and declined Hepatitis B:  declined Syphilis if at high risk: discussed and declined  Reviewed risk factors for latent tuberculosis and not indicated Colorectal cancer screening: up to date on screening for CRC. Needs repeat 2025. Lung cancer screening:  ordered  See documentation below regarding discussion and indication. Smoking for 40 years on and off. Most was half a pack per day.  Follow up in 1 year or sooner if indicated.    Sharion Settler, [place]

## 2022-06-09 NOTE — Assessment & Plan Note (Signed)
Will check electrolytes and Mg today. Last Mg level in September was low. Last CBC post-op in September was normal.

## 2022-06-09 NOTE — Patient Instructions (Signed)
It was wonderful to see you today.  Please bring ALL of your medications with you to every visit.   Today we talked about:  We have gotten you scheduled for a lung function test with Dr. Raymondo Band next week. Please hold off on using Albuterol for about 24 hours prior to appointment.   I have sent a referral to chronic pain.   Tobacco use is damaging to your body. It increases your risk of stroke, heart attack, lung cancer, and serious lung disease in the future. It also reduces your fertility.   Quitting tobacco is the best thing for your health but is a challenge---nicotine, a chemical in cigarettes, is highly addictive.   You can call 1 800 QUIT NOW ([phone])---you will be connected with a Careers information officer. They can also mail you nicotine gums, lozenges, and patches to quit.   Ask me about patches (which you wear all day) and gums (which you use when you have a craving) to help you quit.   There are safe, effective medications to help you quit--  Varencline---also called Chantix---- is the most common medication used to help people stop smoking. It starts a low dose and is increased. I recommended choosing a quit date then starting the medication 8 days before this. Side effects include mild headache, difficulty sleeping, and odd dreams. The medication is typically very well tolerated.   Bupropion---also called Zyban---- is started 1 week before your quit date. You take 1 pill for three days then increase to 1 pill twice per day. Side effects include a mild headache and anxiety---this usually goes away. Some patients experience weight loss.     Today at your annual preventive visit we talked about the following measures I recommend 150 minutes of exercise per week-try 30 minutes 5 days per week We discussed reducing sugary beverages (like soda and juice) and increasing leafy greens and whole fruits.  We discussed avoiding tobacco and alcohol.  I recommend avoiding illicit  substances.  Your blood pressure is  at goal of <130/80.    Thank you for coming to your visit as scheduled. We have had a large "no-show" problem lately, and this significantly limits our ability to see and care for patients. As a friendly reminder- if you cannot make your appointment please call to cancel. We do have a no show policy for those who do not cancel within 24 hours. Our policy is that if you miss or fail to cancel an appointment within 24 hours, 3 times in a 105-month period, you may be dismissed from our clinic.   Thank you for choosing [facility].   Please call [phone] with any questions about today's appointment.  Please be sure to schedule follow up at the front  desk before you leave today.   Sabino Dick, DO PGY-3 Family Medicine

## 2022-06-10 ENCOUNTER — Encounter: Payer: Self-pay | Admitting: Family Medicine

## 2022-06-10 LAB — BASIC METABOLIC PANEL
BUN/Creatinine Ratio: 10 (ref 10–24)
BUN: 9 mg/dL (ref 8–27)
CO2: 22 mmol/L (ref 20–29)
Calcium: 9.4 mg/dL (ref 8.6–10.2)
Chloride: 99 mmol/L (ref 96–106)
Creatinine, Ser: 0.86 mg/dL (ref 0.76–1.27)
Glucose: 87 mg/dL (ref 70–99)
Potassium: 4.3 mmol/L (ref 3.5–5.2)
Sodium: 136 mmol/L (ref 134–144)
eGFR: 99 mL/min/{1.73_m2} (ref >59)

## 2022-06-10 LAB — MAGNESIUM: Magnesium: 1.7 mg/dL (ref 1.6–2.3)

## 2022-06-13 ENCOUNTER — Other Ambulatory Visit: Payer: Self-pay | Admitting: Family Medicine

## 2022-06-13 ENCOUNTER — Telehealth: Payer: Self-pay

## 2022-06-13 MED ORDER — OLOPATADINE HCL 0.1 % OP SOLN
1.0000 [drp] | Freq: Two times a day (BID) | OPHTHALMIC | 12 refills | Status: DC
Start: 2022-06-13 — End: 2023-03-06

## 2022-06-13 NOTE — Telephone Encounter (Signed)
Rx eye drops.

## 2022-06-13 NOTE — Telephone Encounter (Signed)
Patient calls nurse line requesting eye drops.   He reports he was recently seen and under the impression eye drops were going to be called in.   Patient reports watery itchy eyes. Denies any drainage.  Will forward to PCP.

## 2022-06-18 ENCOUNTER — Ambulatory Visit: Payer: Medicaid Other | Admitting: Pharmacist

## 2022-06-19 ENCOUNTER — Telehealth: Payer: Self-pay

## 2022-06-19 NOTE — Telephone Encounter (Signed)
Patient will call and reschedule because he will be out of town for the holidays.  Patient given number to Centralized Scheduling [phone].  Glennie Hawk, CMA

## 2022-06-24 ENCOUNTER — Telehealth (HOSPITAL_COMMUNITY): Payer: Self-pay | Admitting: Student

## 2022-06-24 ENCOUNTER — Encounter: Payer: Self-pay | Admitting: Physical Medicine and Rehabilitation

## 2022-06-26 ENCOUNTER — Encounter (HOSPITAL_COMMUNITY): Payer: Medicaid Other | Admitting: Student

## 2022-06-26 ENCOUNTER — Ambulatory Visit (INDEPENDENT_AMBULATORY_CARE_PROVIDER_SITE_OTHER): Payer: Medicaid Other | Admitting: Student

## 2022-06-26 VITALS — BP 121/90 | HR 96 | Ht 71.0 in | Wt 150.0 lb

## 2022-06-26 DIAGNOSIS — F4310 Post-traumatic stress disorder, unspecified: Secondary | ICD-10-CM

## 2022-06-26 DIAGNOSIS — G4700 Insomnia, unspecified: Secondary | ICD-10-CM

## 2022-06-26 NOTE — Progress Notes (Signed)
BH MD Outpatient Progress Note  06/27/2022 8:01 AM Carlos Montoya  MRN:  [id number]  Assessment:  Carlos Montoya presents for follow-up evaluation in-person. Today, 06/27/22, the patient reports no benefit from 50 mg of lamotrigine and states that he discontinued the medication several weeks ago.  His current most bothersome symptom is nightmares and insomnia.  We will plan to start prazosin.  The patient continues to decline therapy.  Identifying Information: Carlos Montoya is a 61 y.o. y.o. male with a history of major depressive disorder and generalized anxiety disorder, as well as a medical history of hepatitis C s/p treatment, and umbilical hernia s/p recent repair who is an established patient with [facility] for management of depression, anxiety, and insomnia.   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  Status of problem: acute Interventions: Hold lamotrigine for now, and given patient's lack of interest in continuing this medication.  Will not call this a failed medication trial given that the patient never reached a therapeutic dose.  #PTSD  nightmares  insomnia Status of problem: Acute Interventions: Start prazosin 1 mg nightly, increase to 2 mg nightly after 1 week if needed Discussed side effect of orthostasis and the need for the patient to be careful upon arising in the morning.  # Generalized Anxiety Disorder Past medication trials: As above Status of problem: New problem for this provider Interventions: As above -- Front desk to schedule therapy for the patient  Patient was given contact information for behavioral health clinic and was instructed to call 911 for emergencies.   Subjective:  Chief Complaint:  Chief Complaint  Patient presents with   Insomnia    Interval History:  The  patient reports a variety of different issues.  He reports taking lamotrigine faithfully for several weeks but then stopped taking it because it was ineffective.  Discussed with him that he was not on full therapeutic dose and that I asked him specifically at the previous visit to continue taking the medication even if he did not feel a benefit.  He reports anxiety related to recent thoughts surrounding his physical health.  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  Discussed with him the need for good sleep hygiene and best sleep practices.  Discussed CBT-I practices.  Patient was receptive to this.  Also discussed the addition of prazosin, which the patient was also receptive of.  The patient continues to complain of significant pain in his leg but states that he has a pain management appointment 1 month from now that should be helpful.  The patient reports some mild depression but states that his main problems are anxiety, insomnia, and pain.  He denies experiencing any suicidal thoughts and states that he has a strong will to live.  He denies experiencing auditory or visual hallucinations.   Visit Diagnosis:    ICD-10-CM   1. Insomnia, unspecified type  G47.00 prazosin (MINIPRESS) 1 MG capsule    2. PTSD (post-traumatic stress disorder)  F43.10 prazosin (MINIPRESS) 1 MG capsule        Past Psychiatric History: as above  Past Medical History:  Past Medical History:  Diagnosis Date   Acid reflux  Alcohol use disorder, mild, abuse    Anxiety    Bipolar disorder (HCC)    COPD (chronic obstructive pulmonary disease) (HCC)    Depression    History of hepatitis C 2016   treated   Opioid dependence (HCC)    Pneumonia    Umbilical hernia     Past Surgical History:  Procedure  Laterality Date   COLONOSCOPY     HERNIA REPAIR     TONSILLECTOMY     UMBILICAL HERNIA REPAIR N/A 03/14/2022   Procedure: LAPAROSCOPIC ASSISTED REPAIR UMBILICAL HERNIA WITH MESH;  Surgeon: [name], MD;  Location: [facility];  Service: General;  Laterality: N/A;    Family Psychiatric History: Per initial note  Family History:  Family History  Problem Relation Age of Onset   Diabetes Mother    Heart disease Father    Colon cancer Father     Social History:  Social History   Socioeconomic History   Marital status: Single    Spouse name: Not on file   Number of children: Not on file   Years of education: Not on file   Highest education level: Not on file  Occupational History   Not on file  Tobacco Use   Smoking status: Every Day    Packs/day: 0.50    Types: Cigarettes   Smokeless tobacco: Never  Vaping Use   Vaping Use: Never used  Substance and Sexual Activity   Alcohol use: Yes    Comment: 1 beer today   Drug use: No   Sexual activity: Not on file  Other Topics Concern   Not on file  Social History Narrative   Not on file   Social Determinants of Health   Financial Resource Strain: Not on file  Food Insecurity: Not on file  Transportation Needs: Not on file  Physical Activity: Not on file  Stress: Not on file  Social Connections: Not on file    Allergies:  Allergies  Allergen Reactions   Prednisone Hives   Diphenhydramine Hcl Other (See Comments)    Has opposite effect   Tylenol [Acetaminophen] Other (See Comments)    Told to avoid due to having hepatitis C previously    Current Medications: Current Outpatient Medications  Medication Sig Dispense Refill   prazosin (MINIPRESS) 1 MG capsule Take 1 capsule (1 mg) by mouth at bedtime. Can increase to 2 capsules (2 mg) at bedtime after one week. 60 capsule 1   albuterol (VENTOLIN HFA) 108 (90 Base) MCG/ACT inhaler Inhale 2 puffs into the lungs every 6 (six) hours as needed. 18 each 0   aspirin EC 81 MG  tablet Take 81 mg by mouth daily.     atorvastatin (LIPITOR) 20 MG tablet Take 1 tablet (20 mg total) by mouth daily. (Patient not taking: Reported on 03/15/2022) 90 tablet 3   carbamide peroxide (DEBROX) 6.5 % OTIC solution Place 5 drops into both ears 2 (two) times daily. 15 mL 0   Carboxymethylcellul-Glycerin (LUBRICATING EYE DROPS OP) Place 1 drop into both eyes daily as needed (burning eyes).     cetirizine (ZYRTEC ALLERGY) 10 MG tablet Take 1 tablet (10 mg total) by mouth daily. 30 tablet 0   FLOVENT HFA 44 MCG/ACT inhaler INHALE 2 PUFFS INTO THE LUNGS TWICE A DAY (Patient not taking: Reported on 06/09/2022) 10.6 each 12   fluticasone (FLONASE) 50 MCG/ACT nasal spray Place 1-2 sprays into both nostrils daily as needed for allergies.     gabapentin (NEURONTIN) 300 MG  capsule Take 1 capsule (300 mg total) by mouth 3 (three) times daily. 90 capsule 0   ibuprofen (ADVIL) 600 MG tablet Take 600 mg by mouth every 6 (six) hours as needed for mild pain.     lamoTRIgine (LAMICTAL) 25 MG tablet Take 1 tablet (25 mg total) daily for 2 weeks, then take 2 tablets (50 mg total) daily thereafter (Patient not taking: Reported on 06/09/2022) 90 tablet 1   MILK THISTLE PO Take 1 tablet by mouth daily.     mirtazapine (REMERON) 30 MG tablet Take 1 tablet (30 mg total) by mouth at bedtime. (Patient not taking: Reported on 06/09/2022) 30 tablet 2   olopatadine (PATANOL) 0.1 % ophthalmic solution Place 1 drop into both eyes 2 (two) times daily. 5 mL 12   omeprazole (PRILOSEC) 20 MG capsule TAKE 1 CAPSULE BY MOUTH EVERY DAY (Patient taking differently: Take 20 mg by mouth daily.) 90 capsule 1   oxyCODONE (OXY IR/ROXICODONE) 5 MG immediate release tablet Take 1 tablet (5 mg total) by mouth every 6 (six) hours as needed for severe pain. (Patient not taking: Reported on 03/27/2022) 15 tablet 0   polyethylene glycol (MIRALAX / GLYCOLAX) 17 g packet Take 17 g by mouth every 12 (twelve) hours as needed for mild constipation,  moderate constipation or severe constipation. (Patient not taking: Reported on 05/01/2022) 14 each 0   No current facility-administered medications for this visit.    Psychiatric Specialty Exam: Review of Systems  Respiratory:  Negative for shortness of breath.   Cardiovascular:  Negative for chest pain.  Gastrointestinal:  Negative for constipation, diarrhea, nausea and vomiting. Positive for mild ABD pain.  Neurological:  Negative for headaches.       BP (!) 121/90   Pulse 96   Ht 5\' 11"  (1.803 m)   Wt 150 lb (68 kg)   BMI 20.92 kg/m    General Appearance: Fairly Groomed  Eye Contact:  Good  Speech:  Clear and Coherent  Volume:  Normal  Mood:  depressed, irritable  Affect:  Congruent  Thought Process:  Coherent  Orientation:  Full (Time, Place, and Person)  Thought Content: Logical   Suicidal Thoughts:  No  Homicidal Thoughts:  No  Memory:  Immediate;   Good  Judgement:  fair  Insight: fair  Psychomotor Activity:  Normal  Concentration:  Concentration: Good  Recall:  Good  Fund of Knowledge: Good  Language: Good  Akathisia:  No  Handed:    AIMS (if indicated): not done  Assets:  Communication Skills Desire for Improvement Financial Resources/Insurance Housing Leisure Time Physical Health  ADL's:  Intact  Cognition: WNL  Sleep:  Fair    PE: General: well-appearing; no acute distress  Pulm: no increased work of breathing on room air  Strength & Muscle Tone: within normal limits Neuro: no focal neurological deficits observed  Gait & Station: normal  Metabolic Disorder Labs: No results found for: "HGBA1C", "MPG" No results found for: "PROLACTIN" Lab Results  Component Value Date   CHOL 138 11/06/2021   TRIG 105 11/06/2021   HDL 52 11/06/2021   CHOLHDL 2.7 11/06/2021   LDLCALC 67 11/06/2021   LDLCALC 114 (H) 09/13/2020   Lab Results  Component Value Date   TSH 1.410 09/13/2020    Therapeutic Level Labs: No results found for: "LITHIUM" No  results found for: "VALPROATE" No results found for: "CBMZ"  Screenings: PHQ2-9    Flowsheet Row [facility]  [facility] Visit from [facility]/27/2022 in [facility] Office Visit from 09/13/2020 in [facility] Office Visit from 02/25/2019 in [facility] Office Visit from 11/30/2018 in [facility]  PHQ-2 Total Score 6 1 5 1 1   PHQ-9 Total Score 20 -- 19 -- --      Flowsheet Row ED to Hosp-Admission (Discharged) from 03/14/2022 in [facility] General Surgery ED from 04/01/2021 in [facility]  C-SSRS RISK CATEGORY No Risk No Risk       Collaboration of Care: Collaboration of Care: Other none necessary  Patient/Guardian was advised Release of Information must be obtained prior to any record release in order to collaborate their care with an outside provider. Patient/Guardian was advised if they have not already done so to contact the registration department to sign all necessary forms in order for 04/03/2021 to release information regarding their care.   Consent: Patient/Guardian gives verbal consent for treatment and assignment of benefits for services provided during this visit. Patient/Guardian expressed understanding and agreed to proceed.   A total of 30 minutes was spent involved in face to face clinical care, chart review, documentation.   [place], MD 06/27/2022, 8:01 AM

## 2022-06-27 ENCOUNTER — Encounter (HOSPITAL_COMMUNITY): Payer: Self-pay | Admitting: Student

## 2022-06-27 MED ORDER — PRAZOSIN HCL 1 MG PO CAPS: ORAL_CAPSULE | ORAL | 1 refills | Status: DC

## 2022-07-03 ENCOUNTER — Ambulatory Visit (HOSPITAL_COMMUNITY): Payer: Medicaid Other

## 2022-07-03 ENCOUNTER — Other Ambulatory Visit: Payer: Self-pay | Admitting: Family Medicine

## 2022-07-10 ENCOUNTER — Ambulatory Visit (HOSPITAL_COMMUNITY): Payer: Medicaid Other

## 2022-07-17 ENCOUNTER — Ambulatory Visit: Payer: Medicaid Other | Admitting: Pharmacist

## 2022-07-22 ENCOUNTER — Ambulatory Visit (HOSPITAL_COMMUNITY): Payer: Medicaid Other

## 2022-08-06 ENCOUNTER — Encounter
Payer: Medicaid Other | Attending: Physical Medicine and Rehabilitation | Admitting: Physical Medicine and Rehabilitation

## 2022-08-06 VITALS — BP 134/84 | HR 90 | Ht 71.0 in | Wt 147.0 lb

## 2022-08-06 DIAGNOSIS — M25552 Pain in left hip: Secondary | ICD-10-CM

## 2022-08-06 DIAGNOSIS — F319 Bipolar disorder, unspecified: Secondary | ICD-10-CM | POA: Diagnosis present

## 2022-08-06 DIAGNOSIS — M792 Neuralgia and neuritis, unspecified: Secondary | ICD-10-CM

## 2022-08-06 DIAGNOSIS — G894 Chronic pain syndrome: Secondary | ICD-10-CM

## 2022-08-06 DIAGNOSIS — Z91148 Patient's other noncompliance with medication regimen for other reason: Secondary | ICD-10-CM

## 2022-08-06 DIAGNOSIS — M25551 Pain in right hip: Secondary | ICD-10-CM | POA: Insufficient documentation

## 2022-08-06 MED ORDER — GABAPENTIN 300 MG PO CAPS: 300.0000 mg | ORAL_CAPSULE | Freq: Three times a day (TID) | ORAL | 3 refills | Status: DC

## 2022-08-06 NOTE — Patient Instructions (Signed)
  Degenerative disc disease, lumbar -     Ambulatory referral to Physical Therapy -     DG Lumbar Spine 2-3 Views; Future  Chronic pain syndrome Assessment & Plan: Start PT aquatherapy for mobilization and pain control.  Follow up in 1 month; be prepared for UDS and pain contract at that time. Bring all medications to that appointment.   Orders: -     DG Lumbar Spine 2-3 Views; Future -     DG HIPS BILAT W OR W/O PELVIS 2V; Future  Bilateral hip pain Assessment & Plan: Xrays ordered for both hips and your low back  I will call you once these results are back, and if appropriate, we may start a prescription strength NSAID for arthritis  Orders: -     Ambulatory referral to Physical Therapy -     DG HIPS BILAT W OR W/O PELVIS 2V; Future  Neuropathic pain of right lower extremity -     Ambulatory referral to Physical Therapy -     DG Lumbar Spine 2-3 Views; Future  Other orders -     Gabapentin; Take 1 capsule (300 mg total) by mouth 3 (three) times daily.  Dispense: 90 capsule; Refill: 3

## 2022-08-06 NOTE — Assessment & Plan Note (Addendum)
Xrays ordered for both hips and your low back  I will call you once these results are back, and if appropriate, we may start a prescription strength NSAID for arthritis  Discussed possible hip injections if OA, patient states he will refuse all injections

## 2022-08-06 NOTE — Assessment & Plan Note (Addendum)
Discharged from [facility] IM 7/24 d/t UDS + oxymorphone, aberrantly.  Patient states he had taken a leftover script from the ER after running out from his prescribed medications, and attempted to contact the office regarding this. Will look over records for further clarification.  Will plan for UDS and pain contract next visit; no guarantee of narcotic medications, and would remain low-dose with Q1M follow up initially if started.

## 2022-08-06 NOTE — Assessment & Plan Note (Addendum)
Resume gabapentin 300 mg TID; can discuss increase or switch to Lyrica at follow up if tolerating  Discussed based on MRI findings, ESI may be the most effective intervention for localized and long-lasting symptom control. Patient states he will refuse injections of any type, even when offered pre-medication. He would not elaborate further on his hesitation. Will re-address next visit.

## 2022-08-06 NOTE — Assessment & Plan Note (Signed)
Established with [facility]; follows with them every 6 weeks. Currently symptoms well controlled.

## 2022-08-06 NOTE — Assessment & Plan Note (Addendum)
Current R leg pain differential R L4-5 radiculopathy vs. Meralgia paresthetica vs. R hip OA vs impingement. Additional likely L hip OA vs. Impingement, myofascial pain and lumbar facet arthropathy.   Start PT aquatherapy for mobilization and pain control.  Follow up in 1 month; be prepared for UDS and pain contract at that time. Bring all medications to that appointment.
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
## 2022-08-14 ENCOUNTER — Ambulatory Visit (INDEPENDENT_AMBULATORY_CARE_PROVIDER_SITE_OTHER): Payer: Medicaid Other | Admitting: Student

## 2022-08-14 VITALS — BP 138/86 | HR 82 | Ht 71.0 in | Wt 147.0 lb

## 2022-08-14 DIAGNOSIS — F3132 Bipolar disorder, current episode depressed, moderate: Secondary | ICD-10-CM | POA: Diagnosis not present

## 2022-08-14 MED ORDER — QUETIAPINE FUMARATE 25 MG PO TABS: ORAL_TABLET | ORAL | 2 refills | Status: DC

## 2022-08-14 MED ORDER — LAMOTRIGINE 100 MG PO TABS: 100.0000 mg | ORAL_TABLET | Freq: Every day | ORAL | 2 refills | Status: DC

## 2022-08-14 NOTE — Progress Notes (Signed)
[facility] MD Outpatient Progress Note  08/14/2022 5:02 PM Carlos Montoya  MRN:  [id number]  Assessment:  Carlos Montoya presents for follow-up evaluation in-person. Today, 08/14/22, the patient reports no benefit with taking prazosin for nightmares and sleep.  States he experienced dizziness with the medication. The patient continues to decline therapy.  He states that his level of depression and anxiety is stable.  Identifying Information: Carlos Montoya is a 62 y.o. y.o. male with a history of major depressive disorder and generalized anxiety disorder, as well as a medical history of hepatitis C s/p treatment, and umbilical hernia s/p recent repair who is an established patient with [facility] for management of depression, anxiety, and insomnia.   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  -Increase lamotrigine from 50 mg daily to 100 mg daily for irritability and possible bipolar depression - Discussed need to monitor for rash and seek medical care should he develop one - Start Seroquel 25 mg nightly, increase to 50 mg after 2 days for sleep and possible bipolar depression  #PTSD  nightmares  insomnia Tried prazosin but the patient developed dizziness and the medication was not effective  # Generalized Anxiety Disorder Past medication trials: As above Status of problem: New problem for this provider Interventions: As above -Patient continues to decline therapy  Patient was given contact information for behavioral health clinic and was instructed to call 911 for emergencies.   Subjective:  Chief Complaint:  Chief Complaint  Patient presents with   Insomnia    Interval History:  The patient reports optimism regarding his pain because he recently saw a pain management specialist.  Reports he will try aqua  therapy.  Reports he has had some difficulty with sleep due to pain.  He reports his depression is unchanged chronic and stable level.  He denies experiencing suicidal thoughts.  The patient continues to decline therapy.   Visit Diagnosis:    ICD-10-CM   1. Bipolar affective disorder, currently depressed, moderate ([place])  F31.32          Past Psychiatric History: as above  Past Medical History:  Past Medical History:  Diagnosis Date   Acid reflux    Alcohol use disorder, mild, abuse    Anxiety    Bipolar disorder ([place])    COPD (chronic obstructive pulmonary disease) ([place])    Depression    History of hepatitis C 2016   treated   Opioid dependence ([place])    Pneumonia    Umbilical hernia     Past Surgical History:  Procedure Laterality Date   COLONOSCOPY     HERNIA REPAIR     TONSILLECTOMY     UMBILICAL HERNIA REPAIR N/A 03/14/2022   Procedure: LAPAROSCOPIC ASSISTED REPAIR UMBILICAL HERNIA WITH MESH;  Surgeon: Greer Pickerel, MD;  Location: [facility];  Service: General;  Laterality: N/A;    Family Psychiatric History: Per initial note  Family History:  Family History  Problem Relation Age of Onset   Diabetes Mother    Heart disease Father    Colon cancer Father     Social History:  Social History   Socioeconomic History   Marital status: Single    Spouse name: Not on file   Number of children: Not on file   Years of education: Not on file   Highest education level: Not on  file  Occupational History   Not on file  Tobacco Use   Smoking status: Every Day    Packs/day: 0.50    Types: Cigarettes   Smokeless tobacco: Never  Vaping Use   Vaping Use: Never used  Substance and Sexual Activity   Alcohol use: Yes    Comment: 1 beer today   Drug use: No   Sexual activity: Not on file  Other Topics Concern   Not on file  Social History Narrative   Not on file   Social Determinants of Health   Financial Resource Strain: Not on file  Food Insecurity: Not on file   Transportation Needs: Not on file  Physical Activity: Not on file  Stress: Not on file  Social Connections: Not on file    Allergies:  Allergies  Allergen Reactions   Prednisone Hives   Diphenhydramine Hcl Other (See Comments)    Has opposite effect   Tylenol [Acetaminophen] Other (See Comments)    Told to avoid due to having hepatitis C previously    Current Medications: Current Outpatient Medications  Medication Sig Dispense Refill   aspirin EC 81 MG tablet Take 81 mg by mouth daily.     atorvastatin (LIPITOR) 20 MG tablet Take 1 tablet (20 mg total) by mouth daily. 90 tablet 3   carbamide peroxide (DEBROX) 6.5 % OTIC solution Place 5 drops into both ears 2 (two) times daily. 15 mL 0   Carboxymethylcellul-Glycerin (LUBRICATING EYE DROPS OP) Place 1 drop into both eyes daily as needed (burning eyes).     cetirizine (ZYRTEC ALLERGY) 10 MG tablet Take 1 tablet (10 mg total) by mouth daily. 30 tablet 0   FLOVENT HFA 44 MCG/ACT inhaler INHALE 2 PUFFS INTO THE LUNGS TWICE A DAY 10.6 each 12   fluticasone (FLONASE) 50 MCG/ACT nasal spray Place 1-2 sprays into both nostrils daily as needed for allergies.     gabapentin (NEURONTIN) 300 MG capsule Take 1 capsule (300 mg total) by mouth 3 (three) times daily. 90 capsule 3   ibuprofen (ADVIL) 600 MG tablet Take 600 mg by mouth every 6 (six) hours as needed for mild pain.     MILK THISTLE PO Take 1 tablet by mouth daily.     mirtazapine (REMERON) 30 MG tablet Take 1 tablet (30 mg total) by mouth at bedtime. 30 tablet 2   olopatadine (PATANOL) 0.1 % ophthalmic solution Place 1 drop into both eyes 2 (two) times daily. 5 mL 12   omeprazole (PRILOSEC) 20 MG capsule TAKE 1 CAPSULE BY MOUTH EVERY DAY (Patient taking differently: Take 20 mg by mouth daily.) 90 capsule 1   oxyCODONE (OXY IR/ROXICODONE) 5 MG immediate release tablet Take 1 tablet (5 mg total) by mouth every 6 (six) hours as needed for severe pain. (Patient not taking: Reported on  08/06/2022) 15 tablet 0   polyethylene glycol (MIRALAX / GLYCOLAX) 17 g packet Take 17 g by mouth every 12 (twelve) hours as needed for mild constipation, moderate constipation or severe constipation. 14 each 0   prazosin (MINIPRESS) 1 MG capsule Take 1 capsule (1 mg) by mouth at bedtime. Can increase to 2 capsules (2 mg) at bedtime after one week. 60 capsule 1   VENTOLIN HFA 108 (90 Base) MCG/ACT inhaler TAKE 2 PUFFS BY MOUTH EVERY 6 HOURS AS NEEDED 18 each 0   No current facility-administered medications for this visit.    Psychiatric Specialty Exam: Review of Systems  Respiratory:  Negative for shortness of breath.  Cardiovascular:  Negative for chest pain.  Gastrointestinal:  Negative for constipation, diarrhea, nausea and vomiting. Positive for mild ABD pain.  Neurological:  Negative for headaches.       BP 138/86   Pulse 82   Wt 147 lb (66.7 kg)   BMI 20.50 kg/m    General Appearance: Fairly Groomed  Eye Contact:  Good  Speech:  Clear and Coherent  Volume:  Normal  Mood:  depressed, irritable  Affect:  Congruent  Thought Process:  Coherent  Orientation:  Full (Time, Place, and Person)  Thought Content: Logical   Suicidal Thoughts:  No  Homicidal Thoughts:  No  Memory:  Immediate;   Good  Judgement:  fair  Insight: fair  Psychomotor Activity:  Normal  Concentration:  Concentration: Good  Recall:  Good  Fund of Knowledge: Good  Language: Good  Akathisia:  No  Handed:    AIMS (if indicated): not done  Assets:  Communication Skills Desire for Improvement Financial Resources/Insurance Housing Leisure Time Physical Health  ADL's:  Intact  Cognition: WNL  Sleep:  Fair    PE: General: well-appearing; no acute distress  Pulm: no increased work of breathing on room air  Strength & Muscle Tone: within normal limits Neuro: no focal neurological deficits observed  Gait & Station: normal  Metabolic Disorder Labs: No results found for: "HGBA1C", "MPG" No results  found for: "PROLACTIN" Lab Results  Component Value Date   CHOL 138 11/06/2021   TRIG 105 11/06/2021   HDL 52 11/06/2021   CHOLHDL 2.7 11/06/2021   LDLCALC 67 11/06/2021   LDLCALC 114 (H) 09/13/2020   Lab Results  Component Value Date   TSH 1.410 09/13/2020    Therapeutic Level Labs: No results found for: "LITHIUM" No results found for: "VALPROATE" No results found for: "CBMZ"  Screenings: PHQ2-9    [place] Office Visit from 08/06/2022 in [place] Visit from 06/09/2022 in [place] Office Visit from 02/27/2022 in [facility] Office Visit from 09/13/2020 in [place] Office Visit from 02/25/2019 in [place]  PHQ-2 Total Score 6 6 1 5 1   PHQ-9 Total Score 18 20 -- 19 --      Flowsheet Row ED to Hosp-Admission (Discharged) from 03/14/2022 in [facility] ED from 04/01/2021 in [facility] Emergency Department at [place] No Risk No Risk       Collaboration of Care: Collaboration of Care: Other none necessary  Patient/Guardian was advised Release of Information must be obtained prior to any record release in order to collaborate their care with an outside provider. Patient/Guardian was advised if they have not already done so to contact the registration department to sign all necessary forms in order for [place] to release information regarding their care.   Consent: Patient/Guardian gives verbal consent for treatment and assignment of benefits for services provided during this visit. Patient/Guardian expressed understanding and agreed to proceed.   A total of 30 minutes was spent involved in face to face clinical care, chart review, documentation.   Corky Sox, MD 08/14/2022, 5:02 PM

## 2022-08-16 ENCOUNTER — Other Ambulatory Visit: Payer: Self-pay | Admitting: Family Medicine

## 2022-09-02 ENCOUNTER — Emergency Department (HOSPITAL_COMMUNITY)
Admission: EM | Admit: 2022-09-02 | Discharge: 2022-09-02 | Discharge status: Against Medical Advice | Payer: Medicaid Other | Attending: Emergency Medicine | Admitting: Emergency Medicine

## 2022-09-02 ENCOUNTER — Encounter (HOSPITAL_COMMUNITY): Payer: Self-pay

## 2022-09-02 DIAGNOSIS — Z5321 Procedure and treatment not carried out due to patient leaving prior to being seen by health care provider: Secondary | ICD-10-CM | POA: Insufficient documentation

## 2022-09-02 DIAGNOSIS — R109 Unspecified abdominal pain: Secondary | ICD-10-CM | POA: Diagnosis present

## 2022-09-02 LAB — CBC
HCT: 45.4 % (ref 39.0–52.0)
Hemoglobin: 15.5 g/dL (ref 13.0–17.0)
MCH: 34.9 pg — ABNORMAL HIGH (ref 26.0–34.0)
MCHC: 34.1 g/dL (ref 30.0–36.0)
MCV: 102.3 fL — ABNORMAL HIGH (ref 80.0–100.0)
Platelets: 270 10*3/uL (ref 150–400)
RBC: 4.44 MIL/uL (ref 4.22–5.81)
RDW: 12.8 % (ref 11.5–15.5)
WBC: 7.7 10*3/uL (ref 4.0–10.5)
nRBC: 0 % (ref 0.0–0.2)

## 2022-09-02 LAB — LIPASE, BLOOD: Lipase: 55 U/L — ABNORMAL HIGH (ref 11–51)

## 2022-09-02 LAB — COMPREHENSIVE METABOLIC PANEL
ALT: 29 U/L (ref 0–44)
AST: 34 U/L (ref 15–41)
Albumin: 3.9 g/dL (ref 3.5–5.0)
Alkaline Phosphatase: 57 U/L (ref 38–126)
Anion gap: 11 (ref 5–15)
BUN: 8 mg/dL (ref 8–23)
CO2: 24 mmol/L (ref 22–32)
Calcium: 8.6 mg/dL — ABNORMAL LOW (ref 8.9–10.3)
Chloride: 104 mmol/L (ref 98–111)
Creatinine, Ser: 1.09 mg/dL (ref 0.61–1.24)
GFR, Estimated: >60 mL/min (ref >60)
Glucose, Bld: 93 mg/dL (ref 70–99)
Potassium: 3.7 mmol/L (ref 3.5–5.1)
Sodium: 139 mmol/L (ref 135–145)
Total Bilirubin: 0.4 mg/dL (ref 0.3–1.2)
Total Protein: 7 g/dL (ref 6.5–8.1)

## 2022-09-02 NOTE — ED Triage Notes (Addendum)
Pt comes via [facility] had an umbilical hernia surgery last year, and has been having abd pain for the past 2-3 days after straining to go to the bathroom, small hernia has popped back out, pt drank 6 pack of beer to try and ease pain.

## 2022-09-02 NOTE — ED Notes (Signed)
Pt left. 

## 2022-09-03 ENCOUNTER — Encounter
Payer: Medicaid Other | Attending: Physical Medicine and Rehabilitation | Admitting: Physical Medicine and Rehabilitation

## 2022-09-03 VITALS — BP 130/82 | HR 79 | Ht 71.0 in | Wt 146.0 lb

## 2022-09-03 DIAGNOSIS — G894 Chronic pain syndrome: Secondary | ICD-10-CM | POA: Diagnosis not present

## 2022-09-03 DIAGNOSIS — M25552 Pain in left hip: Secondary | ICD-10-CM | POA: Diagnosis present

## 2022-09-03 DIAGNOSIS — Z91148 Patient's other noncompliance with medication regimen for other reason: Secondary | ICD-10-CM

## 2022-09-03 DIAGNOSIS — M25551 Pain in right hip: Secondary | ICD-10-CM

## 2022-09-03 DIAGNOSIS — M792 Neuralgia and neuritis, unspecified: Secondary | ICD-10-CM | POA: Diagnosis not present

## 2022-09-03 MED ORDER — GABAPENTIN 600 MG PO TABS: ORAL_TABLET | ORAL | 3 refills | Status: DC

## 2022-09-03 NOTE — Progress Notes (Signed)
Subjective:    Patient ID: Carlos Montoya, male    DOB: 1961-07-01, 62 y.o.   MRN: [id number]  HPI   Carlos Montoya is a 62 y.o. year old male  who  has a past medical history of Acid reflux, Alcohol use disorder, mild, abuse, Anxiety, Bipolar disorder ([place]), COPD (chronic obstructive pulmonary disease) ([place]), Depression, History of hepatitis C (2016), Opioid dependence ([place]), Pneumonia, and Umbilical hernia.   They are presenting to PM&R clinic as follow up for of right leg and low back pain .   Plan from last visit:    Chronic pain syndrome Assessment & Plan: Current R leg pain differential R L4-5 radiculopathy vs. Meralgia paresthetica vs. R hip OA vs impingement. Additional likely L hip OA vs. Impingement, myofascial pain and lumbar facet arthropathy.    Start PT aquatherapy for mobilization and pain control.   Follow up in 1 month; be prepared for UDS and pain contract at that time. Bring all medications to that appointment.    Orders: -     DG Lumbar Spine 2-3 Views; Future -     DG HIPS BILAT W OR W/O PELVIS 2V; Future   Neuropathic pain of right lower extremity Assessment & Plan: Resume gabapentin 300 mg TID; can discuss increase or switch to Lyrica at follow up if tolerating   Discussed based on MRI findings, ESI may be the most effective intervention for localized and long-lasting symptom control. Patient states he will refuse injections of any type, even when offered pre-medication. He would not elaborate further on his hesitation. Will re-address next visit.    Orders: -     Ambulatory referral to Physical Therapy -     DG Lumbar Spine 2-3 Views; Future   Bilateral hip pain Assessment & Plan: Xrays ordered for both hips and your low back   I will call you once these results are back, and if appropriate, we may start a prescription strength NSAID for arthritis   Discussed possible hip injections if OA, patient states he will refuse all injections    Orders: -     Ambulatory referral to Physical Therapy -     DG HIPS BILAT W OR W/O PELVIS 2V; Future   Pain medication agreement broken Assessment & Plan: Discharged from [facility] IM 7/24 d/t UDS + oxymorphone, aberrantly.   Patient states he had taken a leftover script from the ER after running out from his prescribed medications, and attempted to contact the office regarding this. Will look over records for further clarification.   Will plan for UDS and pain contract next visit; no guarantee of narcotic medications, and would remain low-dose with Q1M follow up initially if started.      Bipolar 1 disorder [facility]) Assessment & Plan: Established with [facility]; follows with them every 6 weeks. Currently symptoms well controlled.       Interval Hx:  - Therapies: Never was contacted regarding starting therapies.    - ADLs: States he spends most of his days laying on the couch. Bending is intolerable for his back; he states he will get sharp pains down his back with pins and needles down his right thigh with cramping in his legs for 2-3 minutes at a time, worse at nighttime.    - Falls:none   - Medications: Did not fill gabapentin 300 mg tid. He states he ran out 4-5 days ago. States it did not help his pain much; it is inconsistent.  Recently started some new medications that have made him constipated. He has small Bms most recently yesterday. Hasn't taken any stool softeners with it.    - Other concerns: Abdominal pain and some palpable mesh from prior umbilical hernia repair.   Pain Inventory Average Pain 5 Pain Right Now 4 My pain is sharp, burning, dull, stabbing, and tingling  In the last 24 hours, has pain interfered with the following? General activity 6 Relation with others 0 Enjoyment of life 0 What TIME of day is your pain at its worst? morning , daytime, evening, and night Sleep (in general) Poor  Pain is worse with: walking, bending, and  standing Pain improves with:  rest Relief from Meds:  not currently taking pain medications  Family History  Problem Relation Age of Onset   Diabetes Mother    Heart disease Father    Colon cancer Father    Social History   Socioeconomic History   Marital status: Single    Spouse name: Not on file   Number of children: Not on file   Years of education: Not on file   Highest education level: Not on file  Occupational History   Not on file  Tobacco Use   Smoking status: Every Day    Packs/day: 0.50    Types: Cigarettes   Smokeless tobacco: Never  Vaping Use   Vaping Use: Never used  Substance and Sexual Activity   Alcohol use: Yes    Comment: 1 beer today   Drug use: No   Sexual activity: Not on file  Other Topics Concern   Not on file  Social History Narrative   Not on file   Social Determinants of Health   Financial Resource Strain: Not on file  Food Insecurity: Not on file  Transportation Needs: Not on file  Physical Activity: Not on file  Stress: Not on file  Social Connections: Not on file   Past Surgical History:  Procedure Laterality Date   COLONOSCOPY     [place] N/A 03/14/2022   Procedure: [place];  Surgeon: Greer Pickerel, MD;  Location: [facility];  Service: General;  Laterality: N/A;   Past Surgical History:  Procedure Laterality Date   COLONOSCOPY     HERNIA REPAIR     TONSILLECTOMY     UMBILICAL HERNIA REPAIR N/A 03/14/2022   Procedure: LAPAROSCOPIC ASSISTED REPAIR UMBILICAL HERNIA WITH MESH;  Surgeon: Greer Pickerel, MD;  Location: [facility];  Service: General;  Laterality: N/A;   Past Medical History:  Diagnosis Date   Acid reflux    Alcohol use disorder, mild, abuse    Anxiety    Bipolar disorder (HCC)    COPD (chronic obstructive pulmonary disease) ([place])    Depression    History of hepatitis C 2016   treated   Opioid dependence ([place])     Pneumonia    Umbilical hernia    BP [id number]   Pulse 79   Ht '5\' 11"'$  (1.803 m)   Wt 146 lb (66.2 kg)   SpO2 96%   BMI 20.36 kg/m   Opioid Risk Score:   Fall Risk Score:  `1  Depression screen PHQ 2/9     08/06/2022    1:05 PM 06/09/2022   11:48 AM 02/27/2022    2:13 PM 09/13/2020    2:27 PM 02/25/2019   10:48 AM 11/30/2018    2:28 PM  12/11/2017    1:32 PM  Depression screen PHQ 2/9  Decreased Interest '3 3  3 '$ 0 0 0  Down, Depressed, Hopeless '3 3  2 1 1 '$ 0  PHQ - 2 Score '6 6  5 1 1 '$ 0  Altered sleeping '3 3  3     '$ Tired, decreased energy '3 3  3     '$ Change in appetite '1 1  1     '$ Feeling bad or failure about yourself  '3 3  3     '$ Trouble concentrating '1 3  2     '$ Moving slowly or fidgety/restless '1 1  2     '$ Suicidal thoughts 0 0  0     PHQ-9 Score '18 20  19     '$ Difficult doing work/chores Very difficult Extremely dIfficult  Very difficult        Information is confidential and restricted. Go to Review Flowsheets to unlock data.     Review of Systems  Musculoskeletal:  Positive for back pain.       B/L FOOT PAIN, RT hip, abdominal  All other systems reviewed and are negative.      Objective:   Physical Exam  Constitution: Appropriate appearance for age. No apparent distress  HEENT: PERRL, EOMI grossly intact.  Resp: CTAB. No rales, rhonchi, or wheezing. Cardio: RRR. No mumurs, rubs, or gallops.  No peripheral edema. Abdomen: Nondistended. Nontender. +bowel sounds. Psych: Appropriate mood and affect. Neuro: AAOx4. 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 There was no evidence of spasm. No trigger points were noted.     Neurologic Exam:   DTRs: Reflexes were 2+ in bilateral achilles, patella. Sensory exam: reduced sensation to light touch in right anterior thigh only Motor exam: strength 5/5 throughout Gait: +Mild trendelenburg with R glut weakness.     Special/provocative  testing: Unchanged + SLR, + forward bending, - facet loading +L>R groin and lateral hip pain with resisted hip flexion      Assessment & Plan:   Carlos Montoya is a 62 y.o. year old male  who  has a past medical history of Acid reflux, Alcohol use disorder, mild, abuse, Anxiety, Bipolar disorder ([place]), COPD (chronic obstructive pulmonary disease) ([place]), Depression, History of hepatitis C (2016), Opioid dependence ([place]), Pneumonia, and Umbilical hernia.   They are presenting to PM&R clinic as follow up for of right leg and low back pain .   Chronic pain syndrome Assessment & Plan: Referral re-sent for Start PT aquatherapy for mobilization and pain control.   Continue non-narcotic management for now, as patient was unable to complete most recommendations from last visit.   I will see for follow-up in 6 to 8 weeks.  Orders: -     Ambulatory referral to Physical Therapy  Neuropathic pain of right lower extremity Assessment & Plan: I have sent in a prescription for gabapentin 600 mg capsules.  Start with 1 capsule twice per day, if tolerating after 1 week can add an extra capsule in the afternoons.  Please call clinic if any difficulty tolerating the medication, side effects.  If no improvement in pain control, call clinic after 1 to 2 weeks we will switch you to Lyrica.  Orders: -     Ambulatory referral to Physical Therapy  Bilateral hip pain Assessment & Plan: CT abdomen/pelvis form ER 2/27 sufficient in place of low back  xrays ordered last visit. No apparent bony abnormalities, disc heights well preserved.   Would urge you to consider epidural steroid injection with Dr. Letta Pate to control your right thigh pain.  If this is something you want to pursue, call the office and we will get you scheduled.  Orders: -     Ambulatory referral to Physical Therapy  Pain medication agreement broken Assessment & Plan: Discharged from [facility] IM 7/24 d/t UDS + oxymorphone, aberrantly.    Patient states he had taken a leftover script from the ER after running out from his prescribed medications, and attempted to contact the office regarding this. Will look over records for further clarification.   Maintain non-narcotic management for now; if initiated, will need UDS prior to prescrtibing and will need frequent monitorring    Other orders -     Gabapentin; Start with 1 capsule in the morning and 1 capsule at nighttime. If tolerating well after 1 week, can add 1 capsule in the afternoon.  Dispense: 90 tablet; Refill: 3

## 2022-09-03 NOTE — Patient Instructions (Addendum)
I have sent in a prescription for gabapentin 600 mg capsules.  Start with 1 capsule twice per day, if tolerating after 1 week can add an extra capsule in the afternoons.  Please call clinic if any difficulty tolerating the medication, side effects.  If no improvement in pain control, call clinic after 1 to 2 weeks we will switch you to Lyrica.  I have resent referral for aqua therapy PT, you should get a call about scheduling.  There is no need to obtain the imaging we discussed last time, as the CT got in the ER should be sufficient.  I would recommend going back to your surgeon to discuss pain around the abdominal mesh.  Would urge you to consider epidural steroid injection with Dr. Letta Pate to control your right thigh pain.  If this is something you want to pursue, call the office and we will get you scheduled.  I will see for follow-up in 6 to 8 weeks.

## 2022-09-08 ENCOUNTER — Other Ambulatory Visit: Payer: Self-pay | Admitting: Family Medicine

## 2022-09-08 DIAGNOSIS — E785 Hyperlipidemia, unspecified: Secondary | ICD-10-CM

## 2022-09-08 NOTE — Assessment & Plan Note (Signed)
CT abdomen/pelvis form ER 2/27 sufficient in place of low back xrays ordered last visit. No apparent bony abnormalities, disc heights well preserved.   Would urge you to consider epidural steroid injection with Dr. Letta Pate to control your right thigh pain.  If this is something you want to pursue, call the office and we will get you scheduled.

## 2022-09-08 NOTE — Assessment & Plan Note (Signed)
I have sent in a prescription for gabapentin 600 mg capsules.  Start with 1 capsule twice per day, if tolerating after 1 week can add an extra capsule in the afternoons.  Please call clinic if any difficulty tolerating the medication, side effects.  If no improvement in pain control, call clinic after 1 to 2 weeks we will switch you to Lyrica.

## 2022-09-08 NOTE — Assessment & Plan Note (Signed)
Discharged from [facility] IM 7/24 d/t UDS + oxymorphone, aberrantly.   Patient states he had taken a leftover script from the ER after running out from his prescribed medications, and attempted to contact the office regarding this. Will look over records for further clarification.   Maintain non-narcotic management for now; if initiated, will need UDS prior to prescrtibing and will need frequent monitorring

## 2022-09-08 NOTE — Assessment & Plan Note (Addendum)
Referral re-sent for Start PT aquatherapy for mobilization and pain control.   Continue non-narcotic management for now, as patient was unable to complete most recommendations from last visit.   I will see for follow-up in 6 to 8 weeks.

## 2022-09-17 IMAGING — CT CT CERVICAL SPINE W/O CM
3 of 4 series · 13 of 33 positions shown, 16 images · non-contrast
Comparison: CT brain 09/10/2017

CLINICAL DATA: Head trauma dizzy

EXAM:
CT HEAD WITHOUT CONTRAST
CT CERVICAL SPINE WITHOUT CONTRAST
TECHNIQUE: Multidetector CT imaging of the head and cervical spine was
performed following the standard protocol without intravenous
contrast. Multiplanar CT image reconstructions of the cervical spine
were also generated.

[Series 5: orthogonal bone · axial · 0.35mm/px · z∈[-323,-198]mm · 5 of 102 slices shown, 7 images]
[im 17/102  soft-tissue]
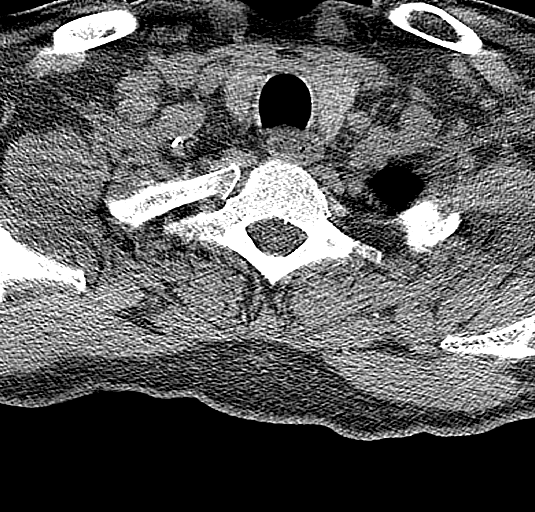
[im 17/102  bone]
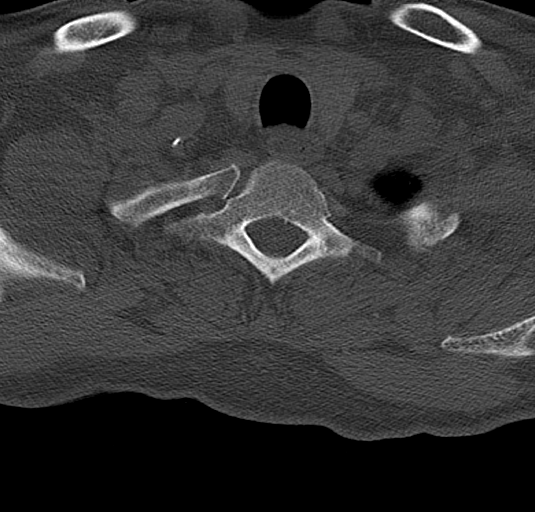
[im 34/102  bone]
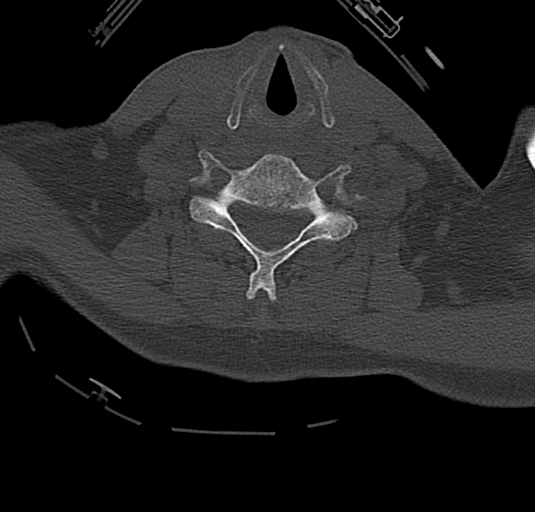
[im 51/102  bone]
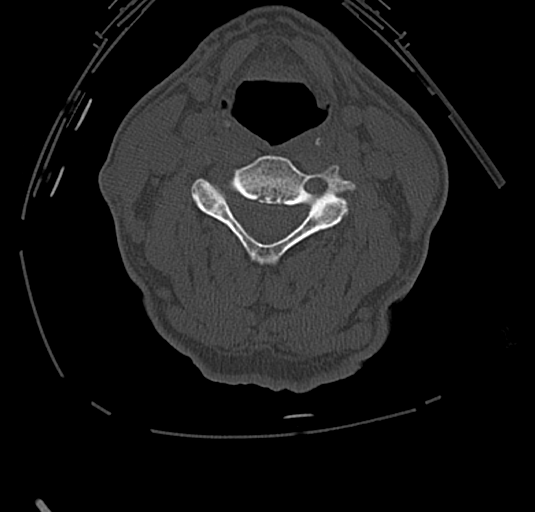
[im 68/102  bone]
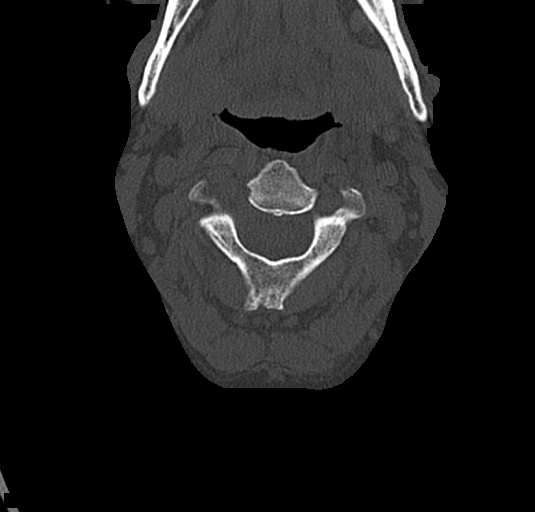
[im 85/102  soft-tissue]
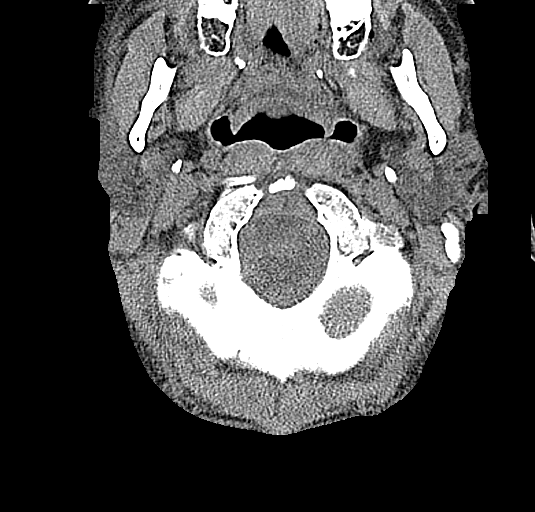
[im 85/102  bone]
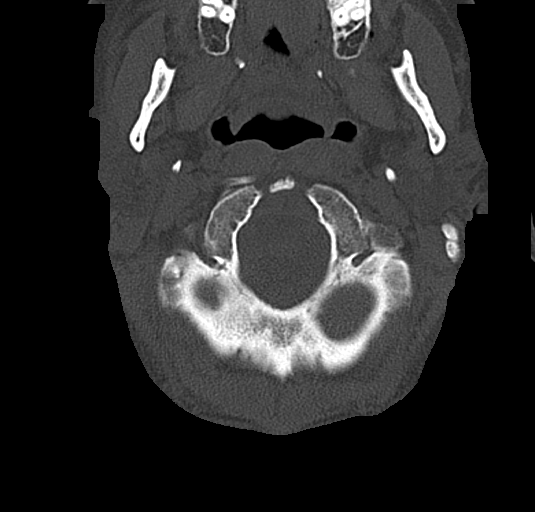

[Series 6: coronal bone · coronal · 0.27mm/px · 3 of 55 slices shown]
[im 11/55  bone]
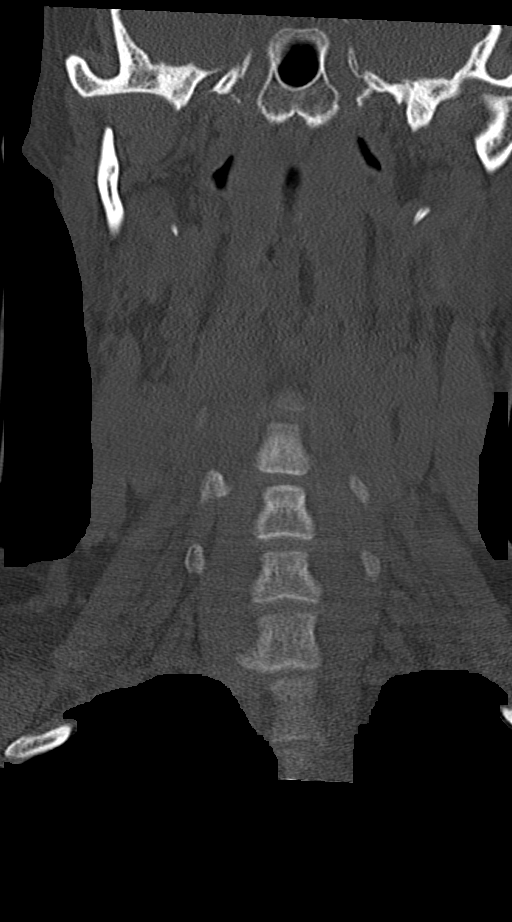
[im 22/55  bone]
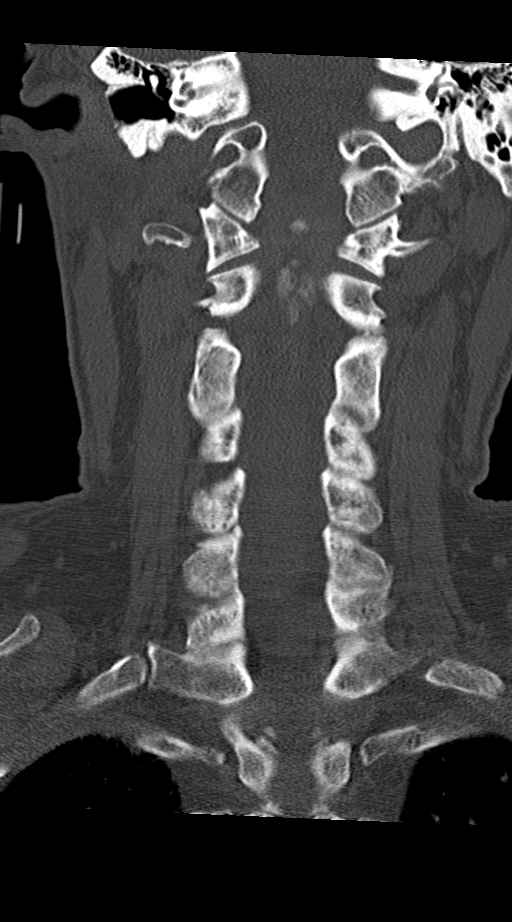
[im 33/55  bone]
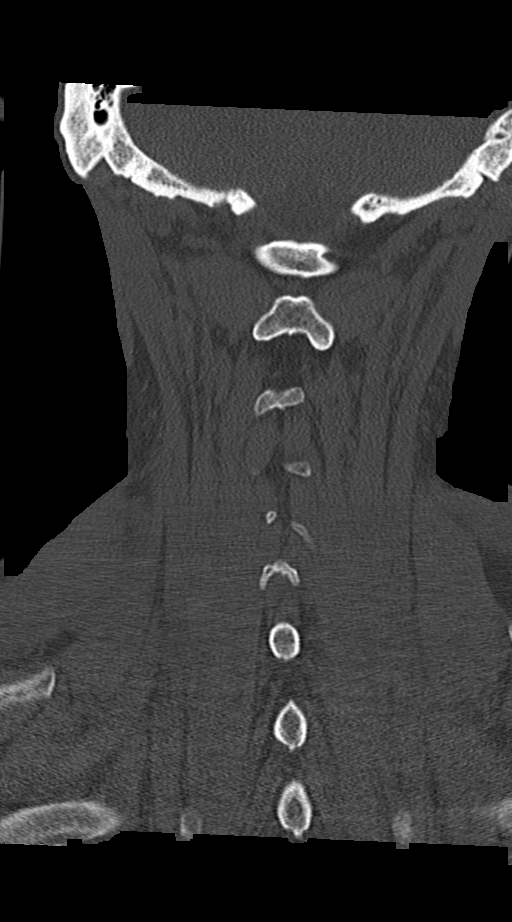

[Series 7: sagittal bone · sagittal · 0.37mm/px · 5 of 58 slices shown, 6 images]
[im 20/58  bone]
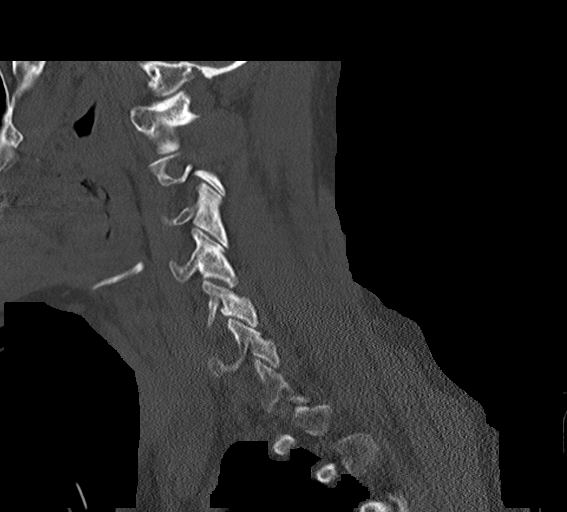
[im 24/58  bone]
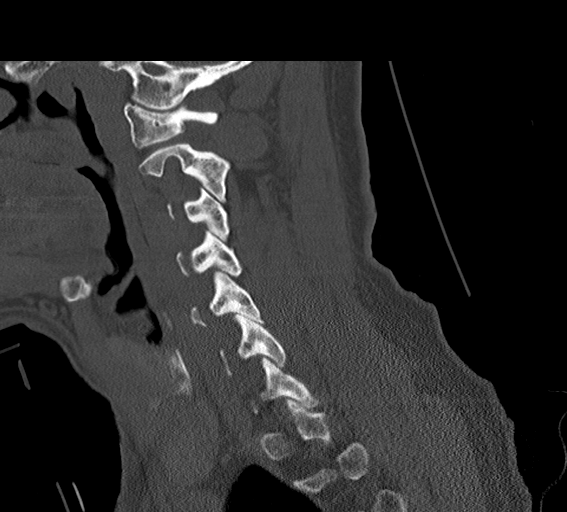
[im 29/58  soft-tissue]
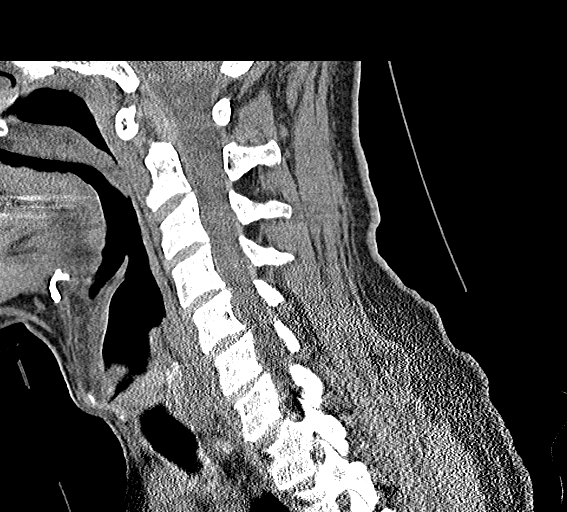
[im 29/58  bone]
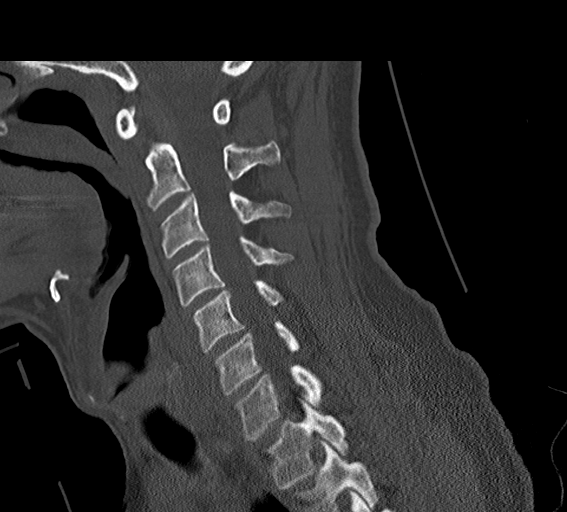
[im 34/58  bone]
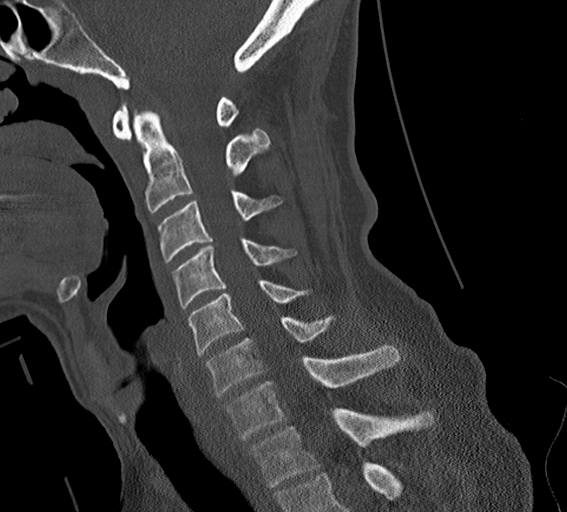
[im 39/58  bone]
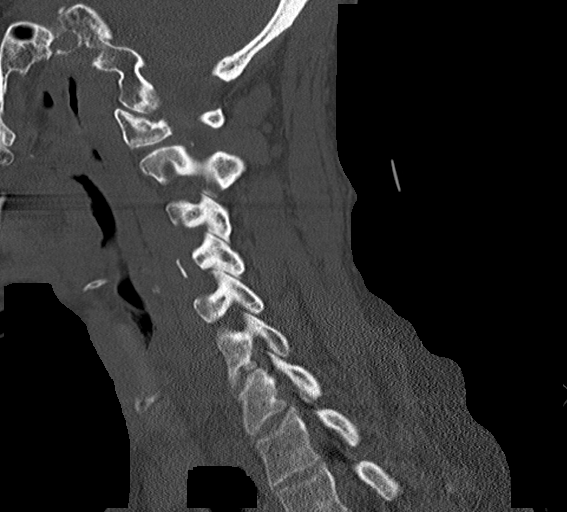

[13 of 33 positions shown; findings below may reference images not displayed]

FINDINGS: CT HEAD FINDINGS

Brain: No acute territorial infarction, hemorrhage or intracranial
mass. Mild atrophy. Nonenlarged ventricles

Vascular: No hyperdense vessel. Carotid vascular calcification

Skull: Normal. Negative for fracture or focal lesion.

Sinuses/Orbits: Chronic fractures medial walls of both orbits.

Other: None

CT CERVICAL SPINE FINDINGS

Alignment: Straightening of the cervical spine. No subluxation.
Facet alignment within normal limits.

Skull base and vertebrae: No acute fracture. No primary bone lesion
or focal pathologic process.

Soft tissues and spinal canal: No prevertebral fluid or swelling. No
visible canal hematoma.

Disc levels:  No significant disc space narrowing or widening.

Upper chest: Negative. Mild apical emphysema

Other: None
IMPRESSION: 1. No CT evidence for acute intracranial abnormality. Mild atrophy.
2. Straightening of the cervical spine. No acute osseous abnormality

## 2022-09-25 ENCOUNTER — Encounter (HOSPITAL_COMMUNITY): Payer: Medicaid Other | Admitting: Student

## 2022-10-07 ENCOUNTER — Other Ambulatory Visit: Payer: Self-pay | Admitting: Family Medicine

## 2022-10-15 ENCOUNTER — Encounter: Payer: Medicaid Other | Admitting: Physical Medicine and Rehabilitation

## 2022-11-06 ENCOUNTER — Telehealth: Payer: Self-pay | Admitting: Physical Medicine and Rehabilitation

## 2022-11-06 DIAGNOSIS — G894 Chronic pain syndrome: Secondary | ICD-10-CM

## 2022-11-06 DIAGNOSIS — M792 Neuralgia and neuritis, unspecified: Secondary | ICD-10-CM

## 2022-11-06 DIAGNOSIS — M25552 Pain in left hip: Secondary | ICD-10-CM

## 2022-11-06 NOTE — Telephone Encounter (Signed)
The patient called requesting to see another provider in the practice for a second opinion

## 2022-11-10 ENCOUNTER — Other Ambulatory Visit: Payer: Self-pay | Admitting: Family Medicine

## 2022-12-02 NOTE — Telephone Encounter (Signed)
Patient is calling back stating he wants to see another doctor.  He is in a lot of pain and gabapentin isn't working.

## 2022-12-02 NOTE — Telephone Encounter (Signed)
Called patient back, went to VM. Gave him the options of either 1) scheduling an earlier follow up with Carlos Montoya to re-evaluate his regimen or 2) contact his insurer to see what pain practices would be covered so we can send a referral somewhere that accepts his [place] Medicaid plan.

## 2022-12-04 ENCOUNTER — Ambulatory Visit (INDEPENDENT_AMBULATORY_CARE_PROVIDER_SITE_OTHER): Payer: Medicaid Other | Admitting: Student

## 2022-12-04 VITALS — BP 128/76 | HR 76 | Ht 71.0 in | Wt 146.0 lb

## 2022-12-04 DIAGNOSIS — F3132 Bipolar disorder, current episode depressed, moderate: Secondary | ICD-10-CM | POA: Diagnosis not present

## 2022-12-04 MED ORDER — LAMOTRIGINE 100 MG PO TABS: 100.0000 mg | ORAL_TABLET | Freq: Every day | ORAL | 2 refills | Status: DC
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
## 2022-12-08 ENCOUNTER — Encounter (HOSPITAL_COMMUNITY): Payer: Self-pay | Admitting: Student

## 2022-12-11 ENCOUNTER — Other Ambulatory Visit: Payer: Self-pay | Admitting: Family Medicine

## 2022-12-17 ENCOUNTER — Encounter
Payer: Medicaid Other | Attending: Physical Medicine and Rehabilitation | Admitting: Physical Medicine and Rehabilitation

## 2022-12-17 ENCOUNTER — Telehealth (HOSPITAL_COMMUNITY): Payer: Self-pay | Admitting: *Deleted

## 2022-12-17 VITALS — BP 116/77 | HR 74 | Ht 71.0 in | Wt 140.0 lb

## 2022-12-17 DIAGNOSIS — Z91148 Patient's other noncompliance with medication regimen for other reason: Secondary | ICD-10-CM | POA: Insufficient documentation

## 2022-12-17 DIAGNOSIS — M792 Neuralgia and neuritis, unspecified: Secondary | ICD-10-CM | POA: Insufficient documentation

## 2022-12-17 DIAGNOSIS — G894 Chronic pain syndrome: Secondary | ICD-10-CM | POA: Diagnosis present

## 2022-12-17 MED ORDER — AMITRIPTYLINE HCL 50 MG PO TABS: 50.0000 mg | ORAL_TABLET | Freq: Every day | ORAL | 5 refills | Status: DC

## 2022-12-17 MED ORDER — BACLOFEN 10 MG PO TABS: 10.0000 mg | ORAL_TABLET | Freq: Three times a day (TID) | ORAL | 5 refills | Status: DC | PRN

## 2022-12-17 NOTE — Patient Instructions (Signed)
I have prescribed you Elavil for pain control; start with 50 mg at nighttime for 2 weeks, then message me through MyChart or call the office. Depending on effects, we may increase the dose to 100 mg at that time.  Start Baclofen 10 mg three times daily as needed for muscle cramps/spasms. You may need to take miralax daily to prevent constipation with this medication.  Start PT when allowed per your surgeon.  I still recommend epidural steroid injecitons with Dr. Wynn Banker for your specific pain, call if you ever change your mind about this to schedule.   Follow up with me in 2-3 months

## 2022-12-17 NOTE — Telephone Encounter (Signed)
Patient called stating he will need a note to get out of [employer]. States he can't concentrate or participate in what is required.

## 2022-12-17 NOTE — Progress Notes (Signed)
Subjective:    Patient ID: Carlos Montoya, male    DOB: 19-Nov-1960, 62 y.o.   MRN: [id number]  HPI  [name] is a 62 y.o. year old male  who  has a past medical history of Acid reflux, Alcohol use disorder, mild, abuse, Anxiety, Bipolar disorder (HCC), COPD (chronic obstructive pulmonary disease) (HCC), Depression, History of hepatitis C (2016), Opioid dependence (HCC), Pneumonia, and Umbilical hernia.   They are presenting to PM&R clinic as follow up for of right leg and low back pain .   Plan from last visit:  Chronic pain syndrome Assessment & Plan: Referral re-sent for Start PT aquatherapy for mobilization and pain control.    Continue non-narcotic management for now, as patient was unable to complete most recommendations from last visit.    I will see for follow-up in 6 to 8 weeks.   Orders: -     Ambulatory referral to Physical Therapy   Neuropathic pain of right lower extremity Assessment & Plan: I have sent in a prescription for gabapentin 600 mg capsules.  Start with 1 capsule twice per day, if tolerating after 1 week can add an extra capsule in the afternoons.  Please call clinic if any difficulty tolerating the medication, side effects.  If no improvement in pain control, call clinic after 1 to 2 weeks we will switch you to Lyrica.   Orders: -     Ambulatory referral to Physical Therapy   Bilateral hip pain Assessment & Plan: CT abdomen/pelvis form ER 2/27 sufficient in place of low back xrays ordered last visit. No apparent bony abnormalities, disc heights well preserved.    Would urge you to consider epidural steroid injection with Dr. Wynn Banker to control your right thigh pain.  If this is something you want to pursue, call the office and we will get you scheduled.   Orders: -     Ambulatory referral to Physical Therapy   Pain medication agreement broken Assessment & Plan: Discharged from [facility] 7/24 d/t UDS + oxymorphone, aberrantly.    Patient states he had taken a leftover script from the ER after running out from his prescribed medications, and attempted to contact the office regarding this. Will look over records for further clarification.    Maintain non-narcotic management for now; if initiated, will need UDS prior to prescrtibing and will need frequent monitorring   Interval Hx:   - Therapies: Was unable to go to Aquatherapy because he has retained sutures in his abdomen and got referred to another therapy place per Dr. Andrey Campanile for his Hernia. He is holding up on aquatic therapy until after hernia issue is resolved.    - Medications: Gabapentin not working; R thigh stinging and cramping. Patient spends most of visit upset regarding provider not prescribing opiates despite his explanation on initial visit why he was fired from prior pain practice. He states he was under the impression after our first visit he would be getting a script once off of marijuana. He notes today he has continued to smoke marijuana for pain control.    - Other concerns: Has cramping in his legs at nighttime.   Pain Inventory Average Pain 5 Pain Right Now 4 My pain is intermittent, sharp, burning, dull, and stabbing  In the last 24 hours, has pain interfered with the following? General activity 6 Relation with others 4 Enjoyment of life 10 What TIME of day is your pain at its worst? morning , daytime, and night Sleep (  in general) Poor  Pain is worse with: walking, bending, and standing Pain improves with: rest Relief from Meds: 2  Family History  Problem Relation Age of Onset   Diabetes Mother    Heart disease Father    Colon cancer Father    Social History   Socioeconomic History   Marital status: Single    Spouse name: Not on file   Number of children: Not on file   Years of education: Not on file   Highest education level: Not on file  Occupational History   Not on file  Tobacco Use   Smoking status: Every Day     Packs/day: .5    Types: Cigarettes   Smokeless tobacco: Never  Vaping Use   Vaping Use: Never used  Substance and Sexual Activity   Alcohol use: Yes    Comment: 1 beer today   Drug use: No   Sexual activity: Not on file  Other Topics Concern   Not on file  Social History Narrative   Not on file   Social Determinants of Health   Financial Resource Strain: Not on file  Food Insecurity: Not on file  Transportation Needs: Not on file  Physical Activity: Not on file  Stress: Not on file  Social Connections: Not on file   Past Surgical History:  Procedure Laterality Date   COLONOSCOPY     HERNIA REPAIR     TONSILLECTOMY     UMBILICAL HERNIA REPAIR N/A 03/14/2022   Procedure: LAPAROSCOPIC ASSISTED REPAIR UMBILICAL HERNIA WITH MESH;  Surgeon: [name], MD;  Location: [facility];  Service: General;  Laterality: N/A;   Past Surgical History:  Procedure Laterality Date   COLONOSCOPY     HERNIA REPAIR     TONSILLECTOMY     UMBILICAL HERNIA REPAIR N/A 03/14/2022   Procedure: LAPAROSCOPIC ASSISTED REPAIR UMBILICAL HERNIA WITH MESH;  Surgeon: [name], MD;  Location: [facility];  Service: General;  Laterality: N/A;   Past Medical History:  Diagnosis Date   Acid reflux    Alcohol use disorder, mild, abuse    Anxiety    Bipolar disorder (HCC)    COPD (chronic obstructive pulmonary disease) (HCC)    Depression    History of hepatitis C 2016   treated   Opioid dependence (HCC)    Pneumonia    Umbilical hernia    There were no vitals taken for this visit.  Opioid Risk Score:   Fall Risk Score:  `1  Depression screen PHQ 2/9     09/03/2022    1:18 PM 08/06/2022    1:05 PM 06/09/2022   11:48 AM 02/27/2022    2:13 PM 09/13/2020    2:27 PM 02/25/2019   10:48 AM 11/30/2018    2:28 PM  Depression screen PHQ 2/9  Decreased Interest 0 3 3  3  0 0  Down, Depressed, Hopeless 0 3 3  2 1 1   PHQ - 2 Score 0 6 6  5 1 1   Altered sleeping  3 3  3     Tired, decreased energy  3 3  3      Change in appetite  1 1  1     Feeling bad or failure about yourself   3 3  3     Trouble concentrating  1 3  2     Moving slowly or fidgety/restless  1 1  2     Suicidal thoughts  0 0  0    PHQ-9 Score  18 20  19    Difficult doing work/chores  Very difficult Extremely dIfficult  Very difficult       Information is confidential and restricted. Go to Review Flowsheets to unlock data.     Review of Systems  Musculoskeletal:  Positive for back pain.       B/L foot Rt leg pain  All other systems reviewed and are negative.      Objective:   Physical Exam   Constitution: Appropriate appearance for age. No apparent distress  HEENT: PERRL, EOMI grossly intact.  Resp: CTAB. No rales, rhonchi, or wheezing. Cardio: RRR. No mumurs, rubs, or gallops.  No peripheral edema. Abdomen: Nondistended. Nontender. +bowel sounds. Psych: Appropriate mood and affect. Neuro: AAOx4. 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 There was no evidence of spasm. No trigger points were noted.      Neurologic Exam:   DTRs: Reflexes were 2+ in bilateral achilles, patella. Sensory exam: reduced sensation to light touch in right anterior thigh only Motor exam: strength 5/5 throughout Gait: +Mild trendelenburg with R glut weakness.     Special/provocative testing: Unchanged + SLR, + forward bending, - facet loading +L>R groin and lateral hip pain with resisted hip flexion        Assessment & Plan:   Arrian Knoch is a 62 y.o. year old male  who  has a past medical history of Acid reflux, Alcohol use disorder, mild, abuse, Anxiety, Bipolar disorder (HCC), COPD (chronic obstructive pulmonary disease) (HCC), Depression, History of hepatitis C (2016), Opioid dependence (HCC), Pneumonia, and Umbilical hernia.   They are presenting to PM&R clinic as follow up for of right leg and low back pain .   Chronic pain  syndrome Neuropathic pain of right lower extremity Gabapentin was ineffective; advised patient to wean off medication   I have prescribed you Elavil for pain control; start with 50 mg at nighttime for 2 weeks, then message me through MyChart or call the office. Depending on effects, we may increase the dose to 100 mg at that time.  Start Baclofen 10 mg three times daily as needed for muscle cramps/spasms. You may need to take miralax daily to prevent constipation with this medication.  Start PT when allowed per your surgeon.  I still recommend epidural steroid injecitons with Dr. Wynn Banker as the best targetting intervention for his specific pain, patient continued to refuse, he can call if you ever change your mind about this to schedule.  Follow up with me in 2-3 months   Pain medication agreement broken As prior documented, discussed with patient that opiate scripts would be a last resort for pain control given his Hx broken pain contract and other substance use.   He continues to smoke marijuana for adjunctive pain control despite knowing he would need to remain abstinent for any narcotic contract  He is welcome to continue following with our practice or seek another pain management referral from his PCP if desired.    Other orders -     Amitriptyline HCl; Take 1 tablet (50 mg total) by mouth at bedtime.  Dispense: 30 tablet; Refill: 5 -     Baclofen; Take 1 tablet (10 mg total) by mouth 3 (three) times daily as needed for muscle spasms.  Dispense: 90 each; Refill: 5

## 2022-12-31 ENCOUNTER — Ambulatory Visit: Payer: Medicaid Other | Attending: General Surgery | Admitting: Physical Therapy

## 2022-12-31 DIAGNOSIS — M6281 Muscle weakness (generalized): Secondary | ICD-10-CM | POA: Diagnosis present

## 2022-12-31 DIAGNOSIS — R269 Unspecified abnormalities of gait and mobility: Secondary | ICD-10-CM | POA: Insufficient documentation

## 2022-12-31 DIAGNOSIS — R279 Unspecified lack of coordination: Secondary | ICD-10-CM | POA: Diagnosis present

## 2022-12-31 DIAGNOSIS — R293 Abnormal posture: Secondary | ICD-10-CM | POA: Diagnosis present

## 2022-12-31 NOTE — Therapy (Signed)
OUTPATIENT PHYSICAL THERAPY MALE PELVIC EVALUATION   Patient Name: Carlos Montoya MRN: [id number] DOB:1960/09/15, 62 y.o., male Today's Date: 12/31/2022  END OF SESSION:  PT End of Session - 12/31/22 1359     Visit Number 1    Date for PT Re-Evaluation 04/02/23    Authorization Type [employer]    PT Start Time 1400    PT Stop Time 1430    PT Time Calculation (min) 30 min    Activity Tolerance Patient tolerated treatment well    Behavior During Therapy WFL for tasks assessed/performed             Past Medical History:  Diagnosis Date   Acid reflux    Alcohol use disorder, mild, abuse    Anxiety    Bipolar disorder (HCC)    COPD (chronic obstructive pulmonary disease) (HCC)    Depression    History of hepatitis C 2016   treated   Opioid dependence (HCC)    Pneumonia    Umbilical hernia    Past Surgical History:  Procedure Laterality Date   COLONOSCOPY     HERNIA REPAIR     TONSILLECTOMY     UMBILICAL HERNIA REPAIR N/A 03/14/2022   Procedure: LAPAROSCOPIC ASSISTED REPAIR UMBILICAL HERNIA WITH MESH;  Surgeon: [name], MD;  Location: [facility];  Service: General;  Laterality: N/A;   Patient Active Problem List   Diagnosis Date Noted   Bilateral hip pain 08/06/2022   Bipolar 1 disorder (HCC) 08/06/2022   Leg cramping 06/09/2022   H/O hernia repair 03/15/2022   GAD (generalized anxiety disorder) 02/28/2022   Asthma 03/03/2019   Foot pain 03/03/2019   Lymphadenopathy 03/03/2019   Weight loss 11/30/2018   Chronic left shoulder pain 04/26/2018   Encounter for smoking cessation counseling 04/26/2018   Seasonal allergies 12/18/2017   Healthcare maintenance 09/10/2017   Chronic pain syndrome 09/10/2017   Left ear impacted cerumen 09/10/2017   Moderate episode of recurrent major depressive disorder (HCC) 09/10/2017   Alcohol use disorder, severe, dependence (HCC) 07/13/2017   Opioid dependence in remission (HCC) 07/13/2017   GERD (gastroesophageal reflux  disease) 03/11/2017   Umbilical hernia 03/11/2017   Bronchitis 03/11/2017   Pain medication agreement broken 02/05/2016   Pain medication agreement signed 10/11/2015   Neuropathic pain of right lower extremity 09/06/2015   Degenerative disc disease, lumbar 07/17/2014   Tobacco use disorder 07/17/2014   Hepatitis C 07/17/2014    PCP: Sabino Dick, DO  REFERRING PROVIDER: [name], MD  REFERRING DIAG: K42.9 (ICD-10-CM) - Umbilical hernia without obstruction or gangrene  THERAPY DIAG:  Muscle weakness (generalized)  Abnormal posture  Unspecified lack of coordination  Abnormality of gait and mobility  Rationale for Evaluation and Treatment: Rehabilitation  ONSET DATE: sept 8, 2023  SUBJECTIVE:  SUBJECTIVE STATEMENT: Neuropathy in bil legs worse in the Rt, falls a lot. Had to go back to hospital after DC from hospital after hernia repair, then re-hospitalized for 3 days. Reports his abdomen hurts to touch. Reports he has noticed worsening of a "bulging forward at center". Pt reports he really struggles    PAIN:  Are you having pain? Yes NPRS scale: 5-10/10 Pain location:  center of abdomen  Pain type: sharp and tight Pain description: intermittent   Aggravating factors: twisting, laying on stomach, constipation Relieving factors: changing position  PRECAUTIONS: Other: recent hernia repair  WEIGHT BEARING RESTRICTIONS: No  FALLS:  Has patient fallen in last 6 months? Yes. Number of falls 3  LIVING ENVIRONMENT: Lives with: lives alone Lives in: House/apartment    PLOF: Requires assistive device for independence - SPC  PATIENT GOALS: to have less pain   PERTINENT HISTORY:  Acid reflux, Alcohol use disorder, mild, abuse, Anxiety, Bipolar disorder (HCC), COPD (chronic  obstructive pulmonary disease) (HCC), Depression, History of hepatitis C (2016), Opioid dependence (HCC), Pneumonia, and Umbilical hernia.   BOWEL MOVEMENT: Pain with bowel movement: No Type of bowel movement:Type (Bristol Stool Scale) 2, Frequency every 3 days, and Strain Yes Fully empty rectum: No Leakage: No Pads: No Fiber supplement: Yes: stool softeners, milk of magnesia   URINATION: Pain with urination: No Fully empty bladder: yes  Stream: Strong Urgency: No Frequency: not quicker than every 2 hours Leakage:  none Pads: No  INTERCOURSE: Pain with intercourse:  not active Climax: not active Ejaculation: No   OBJECTIVE:   DIAGNOSTIC FINDINGS:    PATIENT SURVEYS:    PFIQ-7 100  COGNITION: Overall cognitive status: Within functional limits for tasks assessed     SENSATION: Light touch: Deficits reports numbness in bil feet contributing to falls Proprioception: Deficits reports numbness in bil feet contributing to falls  MUSCLE LENGTH: Bil hamstrings and adductors limited by 25%   FUNCTIONAL TESTS:  Functional  squat - unable to complete, worse pain and trunk shifting as well and use of hands to return to standing    GAIT: Distance walked: 150' Assistive device utilized: Single point cane Level of assistance: CGA however does live alone Comments: shuffled gait, trunk sway, decreased stride length and poor cadence  POSTURE: rounded shoulders, forward head, posterior pelvic tilt, and flexed trunk    LUMBARAROM/PROM: Pain at abdomen with all mobility  A/PROM A/PROM  eval  Flexion Limited by 75%  Extension WFL  Right lateral flexion Limited by 75%  Left lateral flexion Limited by 75%  Right rotation Limited by 75%  Left rotation Limited by 75%   (Blank rows = not tested)  LOWER EXTREMITY AROM/PROM:  [facility]  LOWER EXTREMITY MMT:  Hips grossly 3+/5, knees 4/5  PALPATION: GENERAL very sensitive to pain at umbilicus, pt denied PT palpation other  than light touch at belly button. Pt does have two small bumps at Lt side of belly button that he reports is a staple. Pt did not allow PT to touch this due to pain. Did allow PT to assess abdominal tissue approximately 4 inches on either side of umbilicus and this did have tension noted.               External Perineal Exam not indicated at this time              Internal Pelvic Floor not indicated at this time Patient confirms identification and approves PT to assess internal pelvic floor and treatment No  PELVIC MMT:   MMT eval  Internal Anal Sphincter   External Anal Sphincter   Puborectalis   Diastasis Recti   (Blank rows = not tested)  TONE: not indicated at this time  TODAY'S TREATMENT:                                                                                                                              DATE:   EVAL 12/31/2022 Examination completed, findings reviewed, pt educated on POC, voiding mechanics and abdominal massage handouts. Pt motivated to participate in PT and agreeable to attempt recommendations.     PATIENT EDUCATION:  Education details: voiding mechanics and abdominal massage handouts. Person educated: Patient Education method: Explanation, Demonstration, Tactile cues, Verbal cues, and Handouts Education comprehension: verbalized understanding  HOME EXERCISE PROGRAM: To be given  ASSESSMENT:  CLINICAL IMPRESSION: Patient is a 62 y.o. male  who was seen today for physical therapy evaluation and treatment for abdominal pain status post hernia. Pt reports pain at abdomen for the past 2 months had hernia repair aug 2023. Recent imaging states,"No evidence of hernia recurrence, mesh anchored to wall, referred for deep tissue abdominal wall therapy,". Pt reports he is very limited with all mobility due to pain and has a hard time sleeping due to pain. Would like PT to let MD know he needs something stronger than his current pain medication to help. Pt did  ask PT to give him a prescription for medication for pain however PT educated pt that PT cannot do this and to ask provider. Pt agreed. Did ask several times however. Pt denied palpation of pain at central abdomen, does have tension throughout abdomen, pain throughout abdomen per pt, limited with trunk mobility, hip mobility, decreased core and hip strength, decreased balance and stability in standing assessed functionally gait and posture impairments as well. Pt did need min A-CGA to exit clinic due to balance despite use of SPC. Pt would benefit from additional PT to further address deficits.    OBJECTIVE IMPAIRMENTS: decreased activity tolerance, decreased coordination, decreased endurance, decreased mobility, decreased strength, increased fascial restrictions, increased muscle spasms, impaired flexibility, improper body mechanics, postural dysfunction, and pain.   ACTIVITY LIMITATIONS: carrying, lifting, bending, standing, squatting, sleeping, and locomotion level  PARTICIPATION LIMITATIONS: community activity  PERSONAL FACTORS: Time since onset of injury/illness/exacerbation and 1 comorbidity: medical history  are also affecting patient's functional outcome.   REHAB POTENTIAL: Fair    CLINICAL DECISION MAKING: Evolving/moderate complexity  EVALUATION COMPLEXITY: Moderate   GOALS: Goals reviewed with patient? Yes  SHORT TERM GOALS: Target date: 01/28/23  Pt to be I with HEP.  Baseline: Goal status: INITIAL   LONG TERM GOALS: Target date: 04/02/23  Pt to be I with advanced HEP.  Baseline:  Goal status: INITIAL  2.  Pt to demonstrate at least 5/5 bil hip strength for improved pelvic stability and functional squats without leakage.  Baseline:  Goal status: INITIAL  3.  Pt to be I with relaxation techniques and breathing mechanics for decreased pain.  Baseline:  Goal status: INITIAL  4.  Pt to demonstrate full ROM of trunk in all directions for improved mobility with bed  mobility and gait with less pain.  Baseline:  Goal status: INITIAL  5.  Pt to demonstrate 5xSTS in less than 14s for decreased fall risk Baseline:  Goal status: INITIAL  6.  Pt to report no more than 2/10 pain at abdomen due to improved pelvic stability, core and hip strength and improved mobility. Baseline:  Goal status: INITIAL   PLAN:  PT FREQUENCY: every other week  PT DURATION:  4 sessions  PLANNED INTERVENTIONS: Therapeutic exercises, Therapeutic activity, Neuromuscular re-education, Patient/Family education, Self Care, Joint mobilization, DME instructions, Aquatic Therapy, Dry Needling, Spinal mobilization, Cryotherapy, Moist heat, scar mobilization, Taping, Biofeedback, and Manual therapy  PLAN FOR NEXT SESSION: breathing mechanics, voiding mechanics, stretching at core/spine/hips, manual at abdomen/spine/hips, strengthening core and hips  Otelia Sergeant, PT, DPT 06/26/244:44 PM

## 2023-01-14 ENCOUNTER — Encounter (HOSPITAL_COMMUNITY): Payer: Medicaid Other | Admitting: Student

## 2023-01-15 ENCOUNTER — Telehealth (HOSPITAL_COMMUNITY): Payer: Self-pay

## 2023-01-15 NOTE — Telephone Encounter (Signed)
Medication management - Patient called to report he was considering coming [facility] due to some recent increased anxiety and wanted to let Dr.Gabrielle know. Patient denied any current suicidal or homicidal ideations, no plans, intent, or means to want to harm self or others at this time but wanted Dr. Jerrel Ivory to know he did not feel he was doing as well currently managing his anxiety.  Agreed to inform Dr. Jerrel Ivory of his call and desire to speak to him if he had time but patient denied at this time his need to come into the [facility] for an emergency or crisis evaluation in the [facility].  Patient denied any SI/HI and agreed to send message to Dr. Jerrel Ivory.  Patient agreed to come into the  [facility] if any worsening symptoms or thoughts of wanting  to harm self or others.

## 2023-01-19 ENCOUNTER — Ambulatory Visit: Payer: Medicaid Other

## 2023-01-20 ENCOUNTER — Emergency Department (HOSPITAL_COMMUNITY)
Admission: EM | Admit: 2023-01-20 | Discharge: 2023-01-21 | Disposition: A | Discharge status: Home / Self Care | Payer: Medicaid Other | Attending: Emergency Medicine | Admitting: Emergency Medicine

## 2023-01-20 ENCOUNTER — Other Ambulatory Visit: Payer: Self-pay

## 2023-01-20 ENCOUNTER — Emergency Department (HOSPITAL_COMMUNITY): Payer: Medicaid Other

## 2023-01-20 DIAGNOSIS — Y908 Blood alcohol level of 240 mg/100 ml or more: Secondary | ICD-10-CM | POA: Insufficient documentation

## 2023-01-20 DIAGNOSIS — J449 Chronic obstructive pulmonary disease, unspecified: Secondary | ICD-10-CM | POA: Diagnosis not present

## 2023-01-20 DIAGNOSIS — S0081XA Abrasion of other part of head, initial encounter: Secondary | ICD-10-CM | POA: Insufficient documentation

## 2023-01-20 DIAGNOSIS — X58XXXA Exposure to other specified factors, initial encounter: Secondary | ICD-10-CM | POA: Diagnosis not present

## 2023-01-20 DIAGNOSIS — Z7982 Long term (current) use of aspirin: Secondary | ICD-10-CM | POA: Insufficient documentation

## 2023-01-20 DIAGNOSIS — F10920 Alcohol use, unspecified with intoxication, uncomplicated: Secondary | ICD-10-CM | POA: Diagnosis not present

## 2023-01-20 LAB — COMPREHENSIVE METABOLIC PANEL
ALT: 19 U/L (ref 0–44)
AST: 31 U/L (ref 15–41)
Albumin: 4.1 g/dL (ref 3.5–5.0)
Alkaline Phosphatase: 68 U/L (ref 38–126)
Anion gap: 11 (ref 5–15)
BUN: 9 mg/dL (ref 8–23)
CO2: 23 mmol/L (ref 22–32)
Calcium: 8.3 mg/dL — ABNORMAL LOW (ref 8.9–10.3)
Chloride: 105 mmol/L (ref 98–111)
Creatinine, Ser: 0.86 mg/dL (ref 0.61–1.24)
GFR, Estimated: >60 mL/min (ref >60)
Glucose, Bld: 100 mg/dL — ABNORMAL HIGH (ref 70–99)
Potassium: 3.5 mmol/L (ref 3.5–5.1)
Sodium: 139 mmol/L (ref 135–145)
Total Bilirubin: 0.5 mg/dL (ref 0.3–1.2)
Total Protein: 7.2 g/dL (ref 6.5–8.1)

## 2023-01-20 LAB — CBC WITH DIFFERENTIAL/PLATELET
Abs Immature Granulocytes: 0.02 10*3/uL (ref 0.00–0.07)
Basophils Absolute: 0 10*3/uL (ref 0.0–0.1)
Basophils Relative: 0 %
Eosinophils Absolute: 0.1 10*3/uL (ref 0.0–0.5)
Eosinophils Relative: 1 %
HCT: 42.4 % (ref 39.0–52.0)
Hemoglobin: 15.1 g/dL (ref 13.0–17.0)
Immature Granulocytes: 0 %
Lymphocytes Relative: 36 %
Lymphs Abs: 2.5 10*3/uL (ref 0.7–4.0)
MCH: 35.1 pg — ABNORMAL HIGH (ref 26.0–34.0)
MCHC: 35.6 g/dL (ref 30.0–36.0)
MCV: 98.6 fL (ref 80.0–100.0)
Monocytes Absolute: 0.4 10*3/uL (ref 0.1–1.0)
Monocytes Relative: 6 %
Neutro Abs: 4 10*3/uL (ref 1.7–7.7)
Neutrophils Relative %: 57 %
Platelets: 203 10*3/uL (ref 150–400)
RBC: 4.3 MIL/uL (ref 4.22–5.81)
RDW: 12 % (ref 11.5–15.5)
WBC: 7 10*3/uL (ref 4.0–10.5)
nRBC: 0 % (ref 0.0–0.2)

## 2023-01-20 LAB — LIPASE, BLOOD: Lipase: 38 U/L (ref 11–51)

## 2023-01-20 MED ORDER — SODIUM CHLORIDE 0.9 % IV BOLUS
1000.0000 mL | Freq: Once | INTRAVENOUS | Status: AC
  Administered 2023-01-20: 1000 mL via INTRAVENOUS

## 2023-01-20 MED ORDER — THIAMINE HCL 100 MG/ML IJ SOLN
100.0000 mg | Freq: Once | INTRAMUSCULAR | Status: AC
  Administered 2023-01-21: 100 mg via INTRAVENOUS
  Filled 2023-01-20: qty 2

## 2023-01-20 NOTE — ED Provider Notes (Signed)
[place] EMERGENCY DEPARTMENT AT [facility] Provider Note   CSN: [id number] Arrival date & time: 01/20/23  2147     History {Add pertinent medical, surgical, social history, OB history to HPI:1} Chief Complaint  Patient presents with   Alcohol Intoxication   Altered Mental Status    Carlos Montoya is a 62 y.o. male.  HPI   Patient with medical history including chronic alcohol use, opioid dependency, COPD, bipolar, presenting with complaints of intoxication.  Unable to obtain HPI patient is difficult historian, but per neighbor who I spoke with states that patient drinks alcohol daily, and today when he spoke with the patient the patient indicated that he would like help so the neighbor called the ambulance.  Neighbor does not feel that patient had any recent falls, he states that he is at his normal baseline of alcohol intoxication, he is unaware of any drug use or abuse of prescribed medications.      Home Medications Prior to Admission medications   Medication Sig Start Date End Date Taking? Authorizing Provider  amitriptyline (ELAVIL) 50 MG tablet Take 1 tablet (50 mg total) by mouth at bedtime. 12/17/22   Angelina Sheriff, DO  aspirin EC 81 MG tablet Take 81 mg by mouth daily.    [provider]  atorvastatin (LIPITOR) 20 MG tablet TAKE 1 TABLET BY MOUTH EVERY DAY 09/08/22   Sabino Dick, DO  baclofen (LIORESAL) 10 MG tablet Take 1 tablet (10 mg total) by mouth 3 (three) times daily as needed for muscle spasms. 12/17/22   Angelina Sheriff, DO  carbamide peroxide (DEBROX) 6.5 % OTIC solution Place 5 drops into both ears 2 (two) times daily. 06/09/22   Sabino Dick, DO  Carboxymethylcellul-Glycerin (LUBRICATING EYE DROPS OP) Place 1 drop into both eyes daily as needed (burning eyes).    [provider]  cetirizine (ZYRTEC ALLERGY) 10 MG tablet Take 1 tablet (10 mg total) by mouth daily. 11/06/21   Cora Collum, DO  EYSUVIS 0.25 %  SUSP Apply to eye. 08/23/22   [provider]  FLOVENT HFA 44 MCG/ACT inhaler INHALE 2 PUFFS INTO THE LUNGS TWICE A DAY 09/30/21   Sabino Dick, DO  fluticasone (FLONASE) 50 MCG/ACT nasal spray Place 1-2 sprays into both nostrils daily as needed for allergies.    [provider]  gabapentin (NEURONTIN) 600 MG tablet Start with 1 capsule in the morning and 1 capsule at nighttime. If tolerating well after 1 week, can add 1 capsule in the afternoon. 09/03/22   Elijah Birk C, DO  ibuprofen (ADVIL) 600 MG tablet TAKE 1 TABLET BY MOUTH EVERY 6 HOURS AS NEEDED 10/07/22   Sabino Dick, DO  lamoTRIgine (LAMICTAL) 100 MG tablet Take 1 tablet (100 mg total) by mouth daily. 12/04/22 12/04/23  Carlyn Reichert, MD  MILK THISTLE PO Take 1 tablet by mouth daily.    [provider]  olopatadine (PATANOL) 0.1 % ophthalmic solution Place 1 drop into both eyes 2 (two) times daily. 06/13/22   Sabino Dick, DO  omeprazole (PRILOSEC) 20 MG capsule TAKE 1 CAPSULE BY MOUTH EVERY DAY Patient taking differently: Take 20 mg by mouth daily. 02/17/22   Sabino Dick, DO  oxyCODONE (OXY IR/ROXICODONE) 5 MG immediate release tablet Take 1 tablet (5 mg total) by mouth every 6 (six) hours as needed for severe pain. 03/14/22   [name], MD  polyethylene glycol (MIRALAX / GLYCOLAX) 17 g packet Take 17 g by mouth every 12 (  twelve) hours as needed for mild constipation, moderate constipation or severe constipation. 03/17/22   [name], MD  RESTASIS 0.05 % ophthalmic emulsion Place 1 drop into both eyes 2 (two) times daily. 08/25/22   [provider]  VENTOLIN HFA 108 (90 Base) MCG/ACT inhaler INHALE 2 PUFFS BY MOUTH EVERY 6 HOURS AS NEEDED 12/11/22   Ganta, Anupa, DO      Allergies    Prednisone, Diphenhydramine hcl, and Tylenol [acetaminophen]    Review of Systems   Review of Systems  Unable to perform ROS: Mental status change    Physical Exam Updated Vital Signs BP  (!) 138/92   Pulse 91   Temp 97.7 F (36.5 C)   Resp 12   SpO2 92%  Physical Exam Vitals and nursing note reviewed.  Constitutional:      General: He is not in acute distress.    Appearance: He is not ill-appearing.  HENT:     Head: Normocephalic and atraumatic.     Comments: Small abrasion noted on patient's forehead, no raccoon eyes or Battle sign noted.    Nose: No congestion.     Mouth/Throat:     Mouth: Mucous membranes are dry.     Pharynx: Oropharynx is clear. No oropharyngeal exudate or posterior oropharyngeal erythema.     Comments: No trismus no torticollis no oral trauma present. Eyes:     Extraocular Movements: Extraocular movements intact.     Conjunctiva/sclera: Conjunctivae normal.     Pupils: Pupils are equal, round, and reactive to light.  Cardiovascular:     Rate and Rhythm: Normal rate and regular rhythm.     Pulses: Normal pulses.     Heart sounds: No murmur heard.    No friction rub. No gallop.  Pulmonary:     Effort: No respiratory distress.     Breath sounds: No wheezing, rhonchi or rales.  Abdominal:     General: There is distension.     Palpations: Abdomen is soft.     Tenderness: There is abdominal tenderness. There is no right CVA tenderness or left CVA tenderness.     Comments: Abdomen was distended, soft, patient seen to be tender in the left lower quadrant, without guarding or rebound is or peritoneal sign.  Musculoskeletal:     Right lower leg: No edema.     Left lower leg: No edema.     Comments: Spine was palpated nontender to palpation no step-off deformities noted, no pelvis instability no leg shortening.  Skin:    General: Skin is warm and dry.  Neurological:     Mental Status: He is alert.     Comments: Patient would not communicate with me but is able to follow simple commands, slightly somnolent on my exam but easily arousable, no facial asymmetry, no obvious weakness present.  Psychiatric:        Mood and Affect: Mood normal.      ED Results / Procedures / Treatments   Labs (all labs ordered are listed, but only abnormal results are displayed) Labs Reviewed  COMPREHENSIVE METABOLIC PANEL  ETHANOL  SALICYLATE LEVEL  LIPASE, BLOOD  ACETAMINOPHEN LEVEL  CBC WITH DIFFERENTIAL/PLATELET  RAPID URINE DRUG SCREEN, HOSP PERFORMED    EKG None  Radiology No results found.  Procedures Procedures  {Document cardiac monitor, telemetry assessment procedure when appropriate:1}  Medications Ordered in ED Medications  sodium chloride 0.9 % bolus 1,000 mL (1,000 mLs Intravenous New Bag/Given 01/20/23 2301)    ED Course/ Medical  Decision Making/ A&P   {   Click here for ABCD2, HEART and other calculatorsREFRESH Note before signing :1}                          Medical Decision Making Amount and/or Complexity of Data Reviewed Labs: ordered. Radiology: ordered.   This patient presents to the ED for concern of alcohol intoxication, this involves an extensive number of treatment options, and is a complaint that carries with it a high risk of complications and morbidity.  The differential diagnosis includes metabolic abnormality, intracranial bleed, gastric ulcer rupture psychiatric emergency    Additional history obtained:  Additional history obtained from patient's neighbor External records from outside source obtained and reviewed including internal medicine notes   Co morbidities that complicate the patient evaluation  Alcohol dependency  Social Determinants of Health:  N/A    Lab Tests:  I Ordered, and personally interpreted labs.  The pertinent results include:  ***   Imaging Studies ordered:  I ordered imaging studies including ***  I independently visualized and interpreted imaging which showed *** I agree with the radiologist interpretation   Cardiac Monitoring:  The patient was maintained on a cardiac monitor.  I personally viewed and interpreted the cardiac monitored which  showed an underlying rhythm of: ***   Medicines ordered and prescription drug management:  I ordered medication including intravenous fluids, thiamine I have reviewed the patients home medicines and have made adjustments as needed  Critical Interventions:  ***   Reevaluation:  Presents altered mental status, will obtain screening lab work, imaging provide intravenous fluids and time and reassess.    Consultations Obtained:  I requested consultation with the ***,  and discussed lab and imaging findings as well as pertinent plan - they recommend: ***    Test Considered:  ***    Rule out ****    Dispostion and problem list  After consideration of the diagnostic results and the patients response to treatment, I feel that the patent would benefit from ***.       {Document critical care time when appropriate:1} {Document review of labs and clinical decision tools ie heart score, Chads2Vasc2 etc:1}  {Document your independent review of radiology images, and any outside records:1} {Document your discussion with family members, caretakers, and with consultants:1} {Document social determinants of health affecting pt's care:1} {Document your decision making why or why not admission, treatments were needed:1} Final Clinical Impression(s) / ED Diagnoses Final diagnoses:  None    Rx / DC Orders ED Discharge Orders     None

## 2023-01-20 NOTE — ED Triage Notes (Signed)
Pt arrives EMS from home where neighbor called requesting pt get mental help due to increased ETOH use the last few days. EMS reports 20 beer cans on floor and pt told EMS he "may have taken too many amitriptyline." Med was refilled on 7/1 and there are 23  pills out of 30 count bottle. Pt slurring words and very hard to understand but can state name and where he is. When asked about SI pt shakes head "no." Pt was given NS with EMS  Pt neighbor: Aldin Drees [phone] may be able to give info on pt per EMS

## 2023-01-21 ENCOUNTER — Emergency Department (HOSPITAL_COMMUNITY): Payer: Medicaid Other

## 2023-01-21 ENCOUNTER — Telehealth (HOSPITAL_COMMUNITY): Payer: Self-pay | Admitting: *Deleted

## 2023-01-21 LAB — RAPID URINE DRUG SCREEN, HOSP PERFORMED
Amphetamines: NOT DETECTED
Barbiturates: NOT DETECTED
Benzodiazepines: NOT DETECTED
Cocaine: NOT DETECTED
Opiates: NOT DETECTED
Tetrahydrocannabinol: NOT DETECTED

## 2023-01-21 LAB — SALICYLATE LEVEL: Salicylate Lvl: <7 mg/dL — ABNORMAL LOW (ref 7.0–30.0)

## 2023-01-21 LAB — ACETAMINOPHEN LEVEL: Acetaminophen (Tylenol), Serum: <10 ug/mL — ABNORMAL LOW (ref 10–30)

## 2023-01-21 LAB — ETHANOL: Alcohol, Ethyl (B): 334 mg/dL (ref <10)

## 2023-01-21 NOTE — ED Notes (Signed)
Pt given a [place] sandwich tray and 2 sprites

## 2023-01-21 NOTE — Telephone Encounter (Signed)
Patient called states he was in the hospital last night after taking medication and drinking beer. He needs to speak with his MD to discuss medications soon as possible.

## 2023-01-21 NOTE — Discharge Instructions (Signed)
Lab workup and imaging were reassuring, I provided you with resources for alcohol use if you feel that you need it please review.  Recommend follow-up with community health and wellness for further assessment.  Come back to the emergency department if you develop chest pain, shortness of breath, severe abdominal pain, uncontrolled nausea, vomiting, diarrhea.

## 2023-02-03 ENCOUNTER — Ambulatory Visit: Payer: Medicaid Other | Admitting: Physical Therapy

## 2023-02-04 ENCOUNTER — Telehealth (HOSPITAL_COMMUNITY): Payer: Medicaid Other | Admitting: Student

## 2023-02-09 ENCOUNTER — Other Ambulatory Visit: Payer: Self-pay

## 2023-02-09 MED ORDER — ALBUTEROL SULFATE HFA 108 (90 BASE) MCG/ACT IN AERS: 2.0000 | INHALATION_SPRAY | Freq: Four times a day (QID) | RESPIRATORY_TRACT | 0 refills | Status: DC | PRN

## 2023-02-12 ENCOUNTER — Ambulatory Visit: Payer: Medicaid Other | Admitting: Physical Therapy

## 2023-02-12 ENCOUNTER — Telehealth: Payer: Self-pay | Admitting: Physical Medicine and Rehabilitation

## 2023-02-12 NOTE — Telephone Encounter (Signed)
At this time patient canceled appt., he states he will continue with his therapy and call us once he is done. He is also no longer taking Elavil as he has side effects to it, and it is not helping

## 2023-02-16 ENCOUNTER — Encounter: Payer: Medicaid Other | Admitting: Physical Medicine and Rehabilitation

## 2023-02-19 ENCOUNTER — Encounter (HOSPITAL_COMMUNITY): Payer: Medicaid Other | Admitting: Student

## 2023-02-19 NOTE — Progress Notes (Signed)
Patient connected to video conferencing appointment a few minutes after the scheduled time at 3:30 PM.  Unfortunately, he was either unwilling or unable to turn his camera on for the appointment.  I discussed with him that this was necessary.  He had issues with his microphone as well and I was unable to hear much of what he said.  I ended the video conferencing appointment and called him at his mobile number.  It went straight to voicemail.  I left a voicemail stating that we will reschedule him as soon as possible for an in person appointment.  Set for 2 weeks from now.  Addendum: The patient called back and was able to express his understanding.  He stated that he will be at his in person appointment 2 weeks from now.  Carlyn Reichert, MD PGY-3 This encounter was created in error - please disregard.

## 2023-02-24 ENCOUNTER — Ambulatory Visit: Payer: Medicaid Other | Attending: Physical Medicine and Rehabilitation | Admitting: Physical Therapy

## 2023-02-24 ENCOUNTER — Other Ambulatory Visit: Payer: Self-pay

## 2023-02-24 ENCOUNTER — Encounter: Payer: Self-pay | Admitting: Physical Therapy

## 2023-02-24 DIAGNOSIS — M6281 Muscle weakness (generalized): Secondary | ICD-10-CM | POA: Insufficient documentation

## 2023-02-24 DIAGNOSIS — M792 Neuralgia and neuritis, unspecified: Secondary | ICD-10-CM | POA: Diagnosis not present

## 2023-02-24 DIAGNOSIS — M25552 Pain in left hip: Secondary | ICD-10-CM | POA: Diagnosis not present

## 2023-02-24 DIAGNOSIS — M5459 Other low back pain: Secondary | ICD-10-CM | POA: Diagnosis present

## 2023-02-24 DIAGNOSIS — M25551 Pain in right hip: Secondary | ICD-10-CM | POA: Diagnosis not present

## 2023-02-24 DIAGNOSIS — M79604 Pain in right leg: Secondary | ICD-10-CM | POA: Insufficient documentation

## 2023-02-24 DIAGNOSIS — R293 Abnormal posture: Secondary | ICD-10-CM | POA: Insufficient documentation

## 2023-02-24 DIAGNOSIS — R29898 Other symptoms and signs involving the musculoskeletal system: Secondary | ICD-10-CM | POA: Diagnosis present

## 2023-02-24 DIAGNOSIS — G894 Chronic pain syndrome: Secondary | ICD-10-CM | POA: Insufficient documentation

## 2023-02-24 NOTE — Therapy (Signed)
OUTPATIENT PHYSICAL THERAPY THORACOLUMBAR EVALUATION   Patient Name: Carlos Montoya MRN: [id number] DOB:10/28/1960, 62 y.o., male Today's Date: 02/24/2023  END OF SESSION:  PT End of Session - 02/24/23 1517     Visit Number 1    Number of Visits 13    Date for PT Re-Evaluation 04/07/23    Authorization Type [employer]    Authorization Time Period 02/24/23 to 04/07/23    PT Start Time 1317    PT Stop Time 1358    PT Time Calculation (min) 41 min    Activity Tolerance Patient tolerated treatment well    Behavior During Therapy WFL for tasks assessed/performed             Past Medical History:  Diagnosis Date   Acid reflux    Alcohol use disorder, mild, abuse    Anxiety    Bipolar disorder (HCC)    COPD (chronic obstructive pulmonary disease) (HCC)    Depression    History of hepatitis C 2016   treated   Opioid dependence (HCC)    Pneumonia    Umbilical hernia    Past Surgical History:  Procedure Laterality Date   COLONOSCOPY     HERNIA REPAIR     TONSILLECTOMY     UMBILICAL HERNIA REPAIR N/A 03/14/2022   Procedure: LAPAROSCOPIC ASSISTED REPAIR UMBILICAL HERNIA WITH MESH;  Surgeon: [name], MD;  Location: [facility];  Service: General;  Laterality: N/A;   Patient Active Problem List   Diagnosis Date Noted   Bilateral hip pain 08/06/2022   Bipolar 1 disorder (HCC) 08/06/2022   Leg cramping 06/09/2022   H/O hernia repair 03/15/2022   GAD (generalized anxiety disorder) 02/28/2022   Asthma 03/03/2019   Foot pain 03/03/2019   Lymphadenopathy 03/03/2019   Weight loss 11/30/2018   Chronic left shoulder pain 04/26/2018   Encounter for smoking cessation counseling 04/26/2018   Seasonal allergies 12/18/2017   Healthcare maintenance 09/10/2017   Chronic pain syndrome 09/10/2017   Left ear impacted cerumen 09/10/2017   Moderate episode of recurrent major depressive disorder (HCC) 09/10/2017   Alcohol use disorder, severe, dependence (HCC) 07/13/2017    Opioid dependence in remission (HCC) 07/13/2017   GERD (gastroesophageal reflux disease) 03/11/2017   Umbilical hernia 03/11/2017   Bronchitis 03/11/2017   Pain medication agreement broken 02/05/2016   Pain medication agreement signed 10/11/2015   Neuropathic pain of right lower extremity 09/06/2015   Degenerative disc disease, lumbar 07/17/2014   Tobacco use disorder 07/17/2014   Hepatitis C 07/17/2014    PCP: Elberta Fortis MD   REFERRING PROVIDER: Angelina Sheriff, DO  REFERRING DIAG: G89.4 (ICD-10-CM) - Chronic pain syndrome M79.2 (ICD-10-CM) - Neuropathic pain of right lower extremity M25.551,M25.552 (ICD-10-CM) - Bilateral hip pain  Rationale for Evaluation and Treatment: Rehabilitation  THERAPY DIAG:  Other low back pain  Pain in right leg  Muscle weakness (generalized)  Abnormal posture  Other symptoms and signs involving the musculoskeletal system  ONSET DATE: chronic   SUBJECTIVE:  SUBJECTIVE STATEMENT: Pain has been getting worse in my right leg, its mostly on my thigh; it can feel like I'm being stabbed or like bugs are crawling. It will also go down to my foot and recently it's been feeling like someone is down there pounding on my foot with a hammer. Don't want to do steroid shots, I've just been suffering. Left leg is not too bad, right leg hurts right before I go to bed and can wake me up. I have DDD in my back, it gets bad with sharp pains like I'm getting stabbed in the back. Mobility is really limited. Neuropathy pain is horrible. I recently have been diagnosed with B plantar fasciitis, really I've had it for years. Never had PT for back and leg issues. Its an effort just to do personal hygiene and clean my apartment.   PERTINENT HISTORY:  ETOH abuse, anxiety, bipolar, COPD,  hepatitis C, opioid dependence, PNA  PAIN:  Are you having pain? Yes: NPRS scale: 4/10 Pain location: hips, back, RLE  Pain description: neuropathic pain Aggravating factors: bending, tends to hurt more at night  Relieving factors: laying flat on my back   PRECAUTIONS: Other: light sensitivity, complications with prior hernia surgery (has been sticking back out)  RED FLAGS: None   WEIGHT BEARING RESTRICTIONS: No  FALLS:  Has patient fallen in last 6 months? Yes. Number of falls 6-7 due to pain and R LE giving out, hip and heel pain  LIVING ENVIRONMENT: Lives with: lives alone Lives in: House/apartment Stairs:  has flight of stairs at apt building, but also has handicap access/ramp  Has following equipment at home:  Single point cane OCCUPATION: social security/disability   PLOF: Independent, Independent with basic ADLs, Independent with gait, and Independent with transfers  PATIENT GOALS: take care of pain as much as can, be able to move better in general   NEXT MD VISIT: Dr. Shearon Stalls not scheduled  OBJECTIVE:   DIAGNOSTIC FINDINGS:  CT CERVICAL SPINE FINDINGS   Alignment: No evidence of traumatic malalignment.   Skull base and vertebrae: No acute fracture. No primary bone lesion or focal pathologic process.   Soft tissues and spinal canal: No prevertebral fluid or swelling. No visible canal hematoma.   Disc levels: Mild multilevel spondylosis. No significant spinal canal or neural foraminal narrowing.   Upper chest: Emphysema.   Other: Carotid calcification.   IMPRESSION: 1. No acute intracranial abnormality. 2. No acute fracture in the cervical spine.        SCREENING FOR RED FLAGS: Bowel or bladder incontinence: No Spinal tumors: No Cauda equina syndrome: No Compression fracture: No Abdominal aneurysm: No  COGNITION: Overall cognitive status: Within functional limits for tasks assessed     SENSATION: Neuropathy  MUSCLE LENGTH:  Hamstrings  Moderate limitation L, severe limitation R Piriformis L moderate limitation L, severe limitation L  LLD with R LE about 1/2 longer than L   POSTURE: rounded shoulders, forward head, decreased lumbar lordosis, increased thoracic kyphosis, and flexed trunk   PALPATION: Tight lumbar paraspinals B, limited joint mobility   LUMBAR ROM:   AROM eval  Flexion DNT due to high irritability of pain with bending motion  Extension Mild limitation, REIS no change   Right lateral flexion Mild limitation   Left lateral flexion Mild limitation   Right rotation   Left rotation    (Blank rows = not tested)   LOWER EXTREMITY MMT:    MMT Right eval Left eval  Hip flexion 3+  3+  Hip extension    Hip abduction 3 3  Hip adduction    Hip internal rotation    Hip external rotation    Knee flexion 2 2+  Knee extension 3- 3  Ankle dorsiflexion    Ankle plantarflexion    Ankle inversion    Ankle eversion     (Blank rows = not tested)    FUNCTIONAL TESTS:  5 times sit to stand: 15 seconds intermittent use of UEs    TODAY'S TREATMENT:                                                                                                                              DATE:   Eval  Objective findings, care planning, education as appropriate  Selfcare  Discussed role of lack of sleep in chronic pain, also chronic pain pathways, benefits of physical therapy as a whole and especially water PT, plantar fasciitis management,  POC moving forward, discussed and demonstrated HEP    PATIENT EDUCATION:  Education details: exam findings, POC, HEP, see above  Person educated: Patient Education method: Explanation, Demonstration, and Handouts Education comprehension: verbalized understanding, returned demonstration, and needs further education  HOME EXERCISE PROGRAM: Access Code: [email or web address] URL: [email or web address] Date: 02/24/2023 Prepared by: Nedra Hai  Exercises - Standing Lumbar  Extension at Wall - Forearms  - 1-2 x daily - 7 x weekly - 1 sets - 10 reps - Seated Figure 4 Piriformis Stretch  - 1-2 x daily - 7 x weekly - 1 sets - 3 reps - 30 seconds  hold - Supine Hamstring Stretch with Strap  - 1 x daily - 7 x weekly - 1 sets - 3 reps - 30 seconds  hold - Supine Transversus Abdominis Bracing - Hands on Stomach  - 1 x daily - 7 x weekly - 1 sets - 10 reps - 3 seconds  hold - Gastroc Stretch with Foot at Wall  - 1 x daily - 7 x weekly - 1 sets - 3 reps - 30 seconds  hold - Seated Plantar Fascia Stretch  - 1 x daily - 7 x weekly - 1 sets - 3 reps - 30 seconds  hold  ASSESSMENT:  CLINICAL IMPRESSION: Patient is a 62 y.o. M who was seen today for physical therapy evaluation and treatment for G89.4 (ICD-10-CM) - Chronic pain syndrome M79.2 (ICD-10-CM) - Neuropathic pain of right lower extremity M25.551,M25.552 (ICD-10-CM) - Bilateral hip pain.  Exam with objective findings as above. Of note he does have quite an involved history as well as multiple co-morbidities that I believe are influencing pain levels and mobility; provided empathetic listening as needed during session, as he is understandably quite frustrated about his chronic pain levels in multiple body regions. Provided starting HEP and included some exercises to also address plantar fasciitis, encouraged rolling feet on frozen water bottles as well to assist in addressing pain levels  especially since it sounds like he has  had some falls from plantar fasciitis related pain in addition to back pain. Will benefit from trial of skilled PT services to address all concerns, would likely benefit from water PT as well but we will need to see what MD recommends regarding possible 2nd hernia surgery on the 26th before initiating this.   OBJECTIVE IMPAIRMENTS: Abnormal gait, decreased activity tolerance, decreased balance, decreased knowledge of condition, decreased knowledge of use of DME, decreased mobility, difficulty walking,  decreased ROM, decreased strength, decreased safety awareness, hypomobility, increased fascial restrictions, impaired perceived functional ability, increased muscle spasms, impaired flexibility, impaired sensation, improper body mechanics, postural dysfunction, and pain.   ACTIVITY LIMITATIONS: carrying, lifting, bending, sitting, standing, sleeping, stairs, transfers, bed mobility, bathing, toileting, dressing, reach over head, hygiene/grooming, and locomotion level  PARTICIPATION LIMITATIONS: meal prep, cleaning, laundry, driving, shopping, community activity, yard work, and church  PERSONAL FACTORS: Age, Behavior pattern, Education, Fitness, Past/current experiences, Social background, and Time since onset of injury/illness/exacerbation are also affecting patient's functional outcome.   REHAB POTENTIAL: Fair chronicity of pain, multiple body regions involved in chronic pain presentation   CLINICAL DECISION MAKING: Evolving/moderate complexity  EVALUATION COMPLEXITY: Moderate   GOALS: Goals reviewed with patient? Yes  SHORT TERM GOALS: Target date: 03/17/2023    Will be compliant with appropriate progressive HEP  Baseline: Goal status: INITIAL  2.  Will be able to flex lumbar spine through at least 25% of available motion without increased pain/fear of movement  Baseline:  Goal status: INITIAL  3.  B hamstrings and piriformis groups to be no more than 25% limited in flexibility  Baseline:  Goal status: INITIAL    LONG TERM GOALS: Target date: 04/07/2023    MMT to improve by one grade in all weak groups Baseline:  Goal status: INITIAL  2.  Will be able to flex lumbar spine through at least 50% of available motion with no increased pain or fear of movement in order to allow him to perform ADLs and home care tasks with improved ease  Baseline:  Goal status: INITIAL  3.  Pain to be no more than 7/10 at worst in back, BLEs, or B feet  Baseline:  Goal status: INITIAL  4.   No additional falls in the past 3 weeks  Baseline:  Goal status: INITIAL  5.  Will be able to perform stairs navigation without difficulty or increased pain  Baseline:  Goal status: INITIAL    PLAN:  PT FREQUENCY: 1-2x/week  PT DURATION: 6 weeks  PLANNED INTERVENTIONS: Therapeutic exercises, Therapeutic activity, Neuromuscular re-education, Gait training, Self Care, Aquatic Therapy, Manual therapy, and Re-evaluation.  PLAN FOR NEXT SESSION: what did MD say about possible second hernia surgery? General mobility and exercise as tolerated, start water therapy when appropriate, focus on the functional. Investigate SIJ?   Nedra Hai, PT, DPT 02/24/23 3:28 PM

## 2023-02-27 ENCOUNTER — Other Ambulatory Visit: Payer: Self-pay

## 2023-03-02 ENCOUNTER — Ambulatory Visit: Payer: Medicaid Other | Admitting: Physical Therapy

## 2023-03-02 NOTE — Telephone Encounter (Signed)
Called patient and was able to schedule an office visit to see Dr. Ardyth Harps on 03/13/23 at 1:55pm. Penni Bombard CMA

## 2023-03-03 ENCOUNTER — Other Ambulatory Visit: Payer: Self-pay | Admitting: Family Medicine

## 2023-03-04 MED ORDER — OMEPRAZOLE 20 MG PO CPDR: DELAYED_RELEASE_CAPSULE | ORAL | 1 refills | Status: DC

## 2023-03-05 ENCOUNTER — Telehealth (HOSPITAL_COMMUNITY): Payer: Medicaid Other | Admitting: Student

## 2023-03-06 ENCOUNTER — Emergency Department (HOSPITAL_COMMUNITY): Payer: Medicaid Other

## 2023-03-06 ENCOUNTER — Other Ambulatory Visit: Payer: Self-pay

## 2023-03-06 ENCOUNTER — Observation Stay (HOSPITAL_COMMUNITY)
Admission: EM | Admit: 2023-03-06 | Discharge: 2023-03-09 | Disposition: A | Discharge status: Home / Self Care | Payer: Medicaid Other | Attending: Family Medicine | Admitting: Family Medicine

## 2023-03-06 DIAGNOSIS — B079 Viral wart, unspecified: Secondary | ICD-10-CM | POA: Diagnosis not present

## 2023-03-06 DIAGNOSIS — F10920 Alcohol use, unspecified with intoxication, uncomplicated: Secondary | ICD-10-CM | POA: Diagnosis not present

## 2023-03-06 DIAGNOSIS — R0902 Hypoxemia: Secondary | ICD-10-CM

## 2023-03-06 DIAGNOSIS — J9601 Acute respiratory failure with hypoxia: Secondary | ICD-10-CM | POA: Diagnosis not present

## 2023-03-06 DIAGNOSIS — Z1152 Encounter for screening for COVID-19: Secondary | ICD-10-CM | POA: Insufficient documentation

## 2023-03-06 DIAGNOSIS — R4182 Altered mental status, unspecified: Principal | ICD-10-CM

## 2023-03-06 DIAGNOSIS — Z7982 Long term (current) use of aspirin: Secondary | ICD-10-CM | POA: Insufficient documentation

## 2023-03-06 DIAGNOSIS — R591 Generalized enlarged lymph nodes: Secondary | ICD-10-CM

## 2023-03-06 DIAGNOSIS — L989 Disorder of the skin and subcutaneous tissue, unspecified: Secondary | ICD-10-CM | POA: Insufficient documentation

## 2023-03-06 LAB — I-STAT ARTERIAL BLOOD GAS, ED
Acid-base deficit: 7 mmol/L — ABNORMAL HIGH (ref 0.0–2.0)
Bicarbonate: 21.1 mmol/L (ref 20.0–28.0)
Calcium, Ion: 1.1 mmol/L — ABNORMAL LOW (ref 1.15–1.40)
HCT: 45 % (ref 39.0–52.0)
Hemoglobin: 15.3 g/dL (ref 13.0–17.0)
O2 Saturation: 100 %
Potassium: 3.2 mmol/L — ABNORMAL LOW (ref 3.5–5.1)
Sodium: 139 mmol/L (ref 135–145)
TCO2: 23 mmol/L (ref 22–32)
pCO2 arterial: 50.1 mmHg — ABNORMAL HIGH (ref 32–48)
pH, Arterial: 7.232 — ABNORMAL LOW (ref 7.35–7.45)
pO2, Arterial: 324 mmHg — ABNORMAL HIGH (ref 83–108)

## 2023-03-06 LAB — COMPREHENSIVE METABOLIC PANEL
ALT: 19 U/L (ref 0–44)
AST: 34 U/L (ref 15–41)
Albumin: 3.9 g/dL (ref 3.5–5.0)
Alkaline Phosphatase: 67 U/L (ref 38–126)
Anion gap: 24 — ABNORMAL HIGH (ref 5–15)
BUN: 8 mg/dL (ref 8–23)
CO2: 17 mmol/L — ABNORMAL LOW (ref 22–32)
Calcium: 8.6 mg/dL — ABNORMAL LOW (ref 8.9–10.3)
Chloride: 97 mmol/L — ABNORMAL LOW (ref 98–111)
Creatinine, Ser: 1.22 mg/dL (ref 0.61–1.24)
GFR, Estimated: >60 mL/min (ref >60)
Glucose, Bld: 183 mg/dL — ABNORMAL HIGH (ref 70–99)
Potassium: 3.2 mmol/L — ABNORMAL LOW (ref 3.5–5.1)
Sodium: 138 mmol/L (ref 135–145)
Total Bilirubin: 0.3 mg/dL (ref 0.3–1.2)
Total Protein: 6.7 g/dL (ref 6.5–8.1)

## 2023-03-06 LAB — CK: Total CK: 111 U/L (ref 49–397)

## 2023-03-06 LAB — ETHANOL: Alcohol, Ethyl (B): 288 mg/dL — ABNORMAL HIGH (ref <10)

## 2023-03-06 LAB — CBC WITH DIFFERENTIAL/PLATELET
Abs Immature Granulocytes: 0.04 10*3/uL (ref 0.00–0.07)
Basophils Absolute: 0.1 10*3/uL (ref 0.0–0.1)
Basophils Relative: 1 %
Eosinophils Absolute: 0.1 10*3/uL (ref 0.0–0.5)
Eosinophils Relative: 1 %
HCT: 45.5 % (ref 39.0–52.0)
Hemoglobin: 15.4 g/dL (ref 13.0–17.0)
Immature Granulocytes: 0 %
Lymphocytes Relative: 43 %
Lymphs Abs: 5.1 10*3/uL — ABNORMAL HIGH (ref 0.7–4.0)
MCH: 34.5 pg — ABNORMAL HIGH (ref 26.0–34.0)
MCHC: 33.8 g/dL (ref 30.0–36.0)
MCV: 102 fL — ABNORMAL HIGH (ref 80.0–100.0)
Monocytes Absolute: 0.6 10*3/uL (ref 0.1–1.0)
Monocytes Relative: 5 %
Neutro Abs: 5.9 10*3/uL (ref 1.7–7.7)
Neutrophils Relative %: 50 %
Platelets: 384 10*3/uL (ref 150–400)
RBC: 4.46 MIL/uL (ref 4.22–5.81)
RDW: 12.3 % (ref 11.5–15.5)
WBC: 11.8 10*3/uL — ABNORMAL HIGH (ref 4.0–10.5)
nRBC: 0 % (ref 0.0–0.2)

## 2023-03-06 LAB — I-STAT VENOUS BLOOD GAS, ED
Acid-Base Excess: 1 mmol/L (ref 0.0–2.0)
Acid-base deficit: 7 mmol/L — ABNORMAL HIGH (ref 0.0–2.0)
Bicarbonate: 18.8 mmol/L — ABNORMAL LOW (ref 20.0–28.0)
Bicarbonate: 27.4 mmol/L (ref 20.0–28.0)
Calcium, Ion: 0.97 mmol/L — ABNORMAL LOW (ref 1.15–1.40)
Calcium, Ion: 1.02 mmol/L — ABNORMAL LOW (ref 1.15–1.40)
HCT: 46 % (ref 39.0–52.0)
HCT: 42 % (ref 39.0–52.0)
Hemoglobin: 15.6 g/dL (ref 13.0–17.0)
Hemoglobin: 14.3 g/dL (ref 13.0–17.0)
O2 Saturation: 99 %
O2 Saturation: 61 %
Potassium: 3 mmol/L — ABNORMAL LOW (ref 3.5–5.1)
Potassium: 4.7 mmol/L (ref 3.5–5.1)
Sodium: 138 mmol/L (ref 135–145)
Sodium: 139 mmol/L (ref 135–145)
TCO2: 20 mmol/L — ABNORMAL LOW (ref 22–32)
TCO2: 29 mmol/L (ref 22–32)
pCO2, Ven: 36.6 mmHg — ABNORMAL LOW (ref 44–60)
pCO2, Ven: 52 mmHg (ref 44–60)
pH, Ven: 7.319 (ref 7.25–7.43)
pH, Ven: 7.33 (ref 7.25–7.43)
pO2, Ven: 128 mmHg — ABNORMAL HIGH (ref 32–45)
pO2, Ven: 35 mmHg (ref 32–45)

## 2023-03-06 LAB — MAGNESIUM: Magnesium: 2.1 mg/dL (ref 1.7–2.4)

## 2023-03-06 LAB — I-STAT CHEM 8, ED
BUN: 7 mg/dL — ABNORMAL LOW (ref 8–23)
Calcium, Ion: 0.97 mmol/L — ABNORMAL LOW (ref 1.15–1.40)
Chloride: 102 mmol/L (ref 98–111)
Creatinine, Ser: 1.2 mg/dL (ref 0.61–1.24)
Glucose, Bld: 180 mg/dL — ABNORMAL HIGH (ref 70–99)
HCT: 46 % (ref 39.0–52.0)
Hemoglobin: 15.6 g/dL (ref 13.0–17.0)
Potassium: 3 mmol/L — ABNORMAL LOW (ref 3.5–5.1)
Sodium: 138 mmol/L (ref 135–145)
TCO2: 18 mmol/L — ABNORMAL LOW (ref 22–32)

## 2023-03-06 LAB — RESP PANEL BY RT-PCR (RSV, FLU A&B, COVID)  RVPGX2
Influenza A by PCR: NEGATIVE
Influenza B by PCR: NEGATIVE
Resp Syncytial Virus by PCR: NEGATIVE
SARS Coronavirus 2 by RT PCR: NEGATIVE

## 2023-03-06 MED ORDER — FOLIC ACID 1 MG PO TABS
1.0000 mg | ORAL_TABLET | Freq: Every day | ORAL | Status: DC
  Administered 2023-03-07 – 2023-03-09 (×3): 1 mg via ORAL
  Filled 2023-03-06 (×3): qty 1

## 2023-03-06 MED ORDER — ENOXAPARIN SODIUM 40 MG/0.4ML IJ SOSY
40.0000 mg | PREFILLED_SYRINGE | INTRAMUSCULAR | Status: DC
  Administered 2023-03-07 – 2023-03-08 (×2): 40 mg via SUBCUTANEOUS
  Filled 2023-03-06 (×2): qty 0.4

## 2023-03-06 MED ORDER — AMITRIPTYLINE HCL 50 MG PO TABS
50.0000 mg | ORAL_TABLET | Freq: Every day | ORAL | Status: DC
  Administered 2023-03-06: 50 mg via ORAL
  Filled 2023-03-06: qty 2

## 2023-03-06 MED ORDER — PANTOPRAZOLE SODIUM 40 MG PO TBEC
40.0000 mg | DELAYED_RELEASE_TABLET | Freq: Every day | ORAL | Status: DC
  Administered 2023-03-07 – 2023-03-09 (×3): 40 mg via ORAL
  Filled 2023-03-06 (×3): qty 1

## 2023-03-06 MED ORDER — ASPIRIN 81 MG PO TBEC
81.0000 mg | DELAYED_RELEASE_TABLET | Freq: Every day | ORAL | Status: DC
  Administered 2023-03-07 – 2023-03-09 (×3): 81 mg via ORAL
  Filled 2023-03-06 (×3): qty 1

## 2023-03-06 MED ORDER — SODIUM CHLORIDE 0.9 % IV SOLN
3.0000 g | Freq: Once | INTRAVENOUS | Status: AC
  Administered 2023-03-06: 3 g via INTRAVENOUS
  Filled 2023-03-06: qty 8

## 2023-03-06 MED ORDER — ALBUTEROL SULFATE (2.5 MG/3ML) 0.083% IN NEBU
2.5000 mg | INHALATION_SOLUTION | Freq: Four times a day (QID) | RESPIRATORY_TRACT | Status: DC
  Filled 2023-03-06 (×2): qty 3

## 2023-03-06 MED ORDER — SODIUM CHLORIDE 0.9 % IV BOLUS
1000.0000 mL | Freq: Once | INTRAVENOUS | Status: AC
  Administered 2023-03-06: 1000 mL via INTRAVENOUS

## 2023-03-06 MED ORDER — GABAPENTIN 300 MG PO CAPS
600.0000 mg | ORAL_CAPSULE | Freq: Two times a day (BID) | ORAL | Status: DC
  Administered 2023-03-06 – 2023-03-09 (×5): 600 mg via ORAL
  Filled 2023-03-06 (×6): qty 2

## 2023-03-06 MED ORDER — THIAMINE MONONITRATE 100 MG PO TABS
100.0000 mg | ORAL_TABLET | Freq: Every day | ORAL | Status: DC
  Administered 2023-03-07: 100 mg via ORAL
  Filled 2023-03-06: qty 1

## 2023-03-06 MED ORDER — BACLOFEN 10 MG PO TABS
10.0000 mg | ORAL_TABLET | Freq: Three times a day (TID) | ORAL | Status: DC | PRN
  Administered 2023-03-07 – 2023-03-08 (×2): 10 mg via ORAL
  Filled 2023-03-06 (×2): qty 1

## 2023-03-06 MED ORDER — IOHEXOL 350 MG/ML SOLN
75.0000 mL | Freq: Once | INTRAVENOUS | Status: AC | PRN
  Administered 2023-03-06: 75 mL via INTRAVENOUS

## 2023-03-06 MED ORDER — LAMOTRIGINE 25 MG PO TABS
100.0000 mg | ORAL_TABLET | Freq: Every day | ORAL | Status: DC
  Administered 2023-03-07 – 2023-03-09 (×3): 100 mg via ORAL
  Filled 2023-03-06 (×3): qty 4

## 2023-03-06 NOTE — ED Notes (Signed)
Pt continuously taking oxygen and monitor off. Attempted to educate pt multiple times, this RN entered room and pt was attempting to get out of bed because his IV was beeping. Pt assisted back into bed and placed back on monitor and oxygen and IV pump fixed. While still in room pt removed oxygen once again. Pt educated on the need to keep oxygen on, pt states understanding.

## 2023-03-06 NOTE — Assessment & Plan Note (Signed)
Given patient's past history of withdrawal and single seizure, will initiate CIWA precautions.  Last drink 8 hours ago, no concern for withdrawal at this time as it is too early.  Patient has had multiple ED visits for alcohol intoxication, suspect he has poor insight into his level of drinking.  Does indicate that he would like to cut back, but also states that he is self-medicating.  He took an unclear medication received from a neighbor, believe patient is at high risk for other substance use disorder.  Anion gap acidosis likely d/t EtOH. -Would benefit from outpatient follow-up with PCP for alcohol cessation -Follow-up UDS -CIWA precautions, no lorazepam -If he stays for some time for his hypoxia as above, will need to consider letting him withdraw vs giving him beer with dinner to ward off withdrawals. Really up to him based on how motivated he is to quit or not.  -Thiamine, folate supplementation -Falls precautions

## 2023-03-06 NOTE — ED Notes (Signed)
Pt attempted to use urinal; unable to at this time

## 2023-03-06 NOTE — ED Notes (Signed)
Tech attempted to collect urine sample using a urinal with no success.

## 2023-03-06 NOTE — ED Triage Notes (Signed)
Pt BIBGEMS from home where he was found unresponsive by a neighbor. Pt medical hx unknown, found to be 50% on room air and minimal increase with BVM. Pt unresponsive upon arrival, without painful stimuli. Pt had three episodes of bradycardia from hr of 120s to 40s. Strong ETOH odor.  2mg  Narcan IV  18 g lac  124/80

## 2023-03-06 NOTE — Assessment & Plan Note (Signed)
-  Caution with QT prolonging meds, QTc 518

## 2023-03-06 NOTE — ED Provider Notes (Signed)
[place] EMERGENCY DEPARTMENT AT [facility] Provider Note   CSN: [id number] Arrival date & time: 03/06/23  1443     History  Chief Complaint  Patient presents with   unresponsive    Adem Gamm is a 62 y.o. male.  62 year old male with prior medical history as detailed below presents for evaluation.  Patient was found unresponsive on the floor of his residence by a neighbor.  EMS reports that the patient was quite obtunded on their initial evaluation.  Patient with reported 50% room air pulse ox on EMS evaluation.  Patient was given 2 mg Narcan during transport.  Patient's ventilations were assisted with BVM.  On arrival the patient is spontaneously breathing.  Patient is arousable to painful stimulation.  With vigorous painful stimulation the patient opened his eyes and tried to punch this examiner.  Patient was advised not to do that or to assault staff.  Patient admits to alcohol intake earlier today.  He denies other substance abuse.  He is able to answer questions.  He is able to follow direction.  The history is provided by the patient and medical records.       Home Medications Prior to Admission medications   Medication Sig Start Date End Date Taking? Authorizing Provider  albuterol (VENTOLIN HFA) 108 (90 Base) MCG/ACT inhaler TAKE 2 PUFFS BY MOUTH EVERY 6 HOURS AS NEEDED 03/04/23   Elberta Fortis, MD  amitriptyline (ELAVIL) 50 MG tablet Take 1 tablet (50 mg total) by mouth at bedtime. 12/17/22   Angelina Sheriff, DO  aspirin EC 81 MG tablet Take 81 mg by mouth daily.    [provider]  atorvastatin (LIPITOR) 20 MG tablet TAKE 1 TABLET BY MOUTH EVERY DAY 09/08/22   Sabino Dick, DO  baclofen (LIORESAL) 10 MG tablet Take 1 tablet (10 mg total) by mouth 3 (three) times daily as needed for muscle spasms. 12/17/22   Angelina Sheriff, DO  carbamide peroxide (DEBROX) 6.5 % OTIC solution Place 5 drops into both ears 2 (two) times daily. 06/09/22    Sabino Dick, DO  Carboxymethylcellul-Glycerin (LUBRICATING EYE DROPS OP) Place 1 drop into both eyes daily as needed (burning eyes).    [provider]  cetirizine (ZYRTEC ALLERGY) 10 MG tablet Take 1 tablet (10 mg total) by mouth daily. 11/06/21   Cora Collum, DO  EYSUVIS 0.25 % SUSP Apply to eye. 08/23/22   [provider]  FLOVENT HFA 44 MCG/ACT inhaler INHALE 2 PUFFS INTO THE LUNGS TWICE A DAY 09/30/21   Sabino Dick, DO  fluticasone (FLONASE) 50 MCG/ACT nasal spray Place 1-2 sprays into both nostrils daily as needed for allergies.    [provider]  gabapentin (NEURONTIN) 600 MG tablet Start with 1 capsule in the morning and 1 capsule at nighttime. If tolerating well after 1 week, can add 1 capsule in the afternoon. 09/03/22   Elijah Birk C, DO  ibuprofen (ADVIL) 600 MG tablet TAKE 1 TABLET BY MOUTH EVERY 6 HOURS AS NEEDED 10/07/22   Sabino Dick, DO  lamoTRIgine (LAMICTAL) 100 MG tablet Take 1 tablet (100 mg total) by mouth daily. 12/04/22 12/04/23  Carlyn Reichert, MD  MILK THISTLE PO Take 1 tablet by mouth daily.    [provider]  olopatadine (PATANOL) 0.1 % ophthalmic solution Place 1 drop into both eyes 2 (two) times daily. 06/13/22   Sabino Dick, DO  omeprazole (PRILOSEC) 20 MG capsule TAKE 1 CAPSULE BY MOUTH EVERY DAY 03/04/23  Elberta Fortis, MD  oxyCODONE (OXY IR/ROXICODONE) 5 MG immediate release tablet Take 1 tablet (5 mg total) by mouth every 6 (six) hours as needed for severe pain. 03/14/22   [name], MD  polyethylene glycol (MIRALAX / GLYCOLAX) 17 g packet Take 17 g by mouth every 12 (twelve) hours as needed for mild constipation, moderate constipation or severe constipation. 03/17/22   [name], MD  RESTASIS 0.05 % ophthalmic emulsion Place 1 drop into both eyes 2 (two) times daily. 08/25/22   [provider]      Allergies    Prednisone, Diphenhydramine hcl, and Tylenol [acetaminophen]     Review of Systems   Review of Systems  All other systems reviewed and are negative.   Physical Exam Updated Vital Signs BP 122/83   Pulse 90   Temp 98.5 F (36.9 C)   Resp 16   SpO2 99%  Physical Exam Vitals and nursing note reviewed.  Constitutional:      General: He is not in acute distress.    Appearance: Normal appearance. He is well-developed.     Comments: Somnolent but arousable with strong painful stim.  Once patient was aroused he was able to answer questions and follow directions correctly.  Patient is moving all 4 extremities equally.  His speech is without significant slurring.  Patient is spontaneously breathing without apparent difficulty.  HENT:     Head: Normocephalic and atraumatic.  Eyes:     Conjunctiva/sclera: Conjunctivae normal.     Pupils: Pupils are equal, round, and reactive to light.  Cardiovascular:     Rate and Rhythm: Normal rate and regular rhythm.     Heart sounds: Normal heart sounds.  Pulmonary:     Effort: Pulmonary effort is normal. No respiratory distress.     Breath sounds: Normal breath sounds.  Abdominal:     General: There is no distension.     Palpations: Abdomen is soft.     Tenderness: There is no abdominal tenderness.  Musculoskeletal:        General: No deformity. Normal range of motion.     Cervical back: Normal range of motion and neck supple.  Skin:    General: Skin is warm and dry.  Neurological:     General: No focal deficit present.     Mental Status: He is alert and oriented to person, place, and time.     ED Results / Procedures / Treatments   Labs (all labs ordered are listed, but only abnormal results are displayed) Labs Reviewed  I-STAT VENOUS BLOOD GAS, ED - Abnormal; Notable for the following components:      Result Value   pCO2, Ven 36.6 (*)    pO2, Ven 128 (*)    Bicarbonate 18.8 (*)    TCO2 20 (*)    Acid-base deficit 7.0 (*)    Potassium 3.0 (*)    Calcium, Ion 0.97 (*)    All other  components within normal limits  I-STAT CHEM 8, ED - Abnormal; Notable for the following components:   Potassium 3.0 (*)    BUN 7 (*)    Glucose, Bld 180 (*)    Calcium, Ion 0.97 (*)    TCO2 18 (*)    All other components within normal limits  I-STAT ARTERIAL BLOOD GAS, ED - Abnormal; Notable for the following components:   pH, Arterial 7.232 (*)    pCO2 arterial 50.1 (*)    pO2, Arterial 324 (*)    Acid-base deficit  7.0 (*)    Potassium 3.2 (*)    Calcium, Ion 1.10 (*)    All other components within normal limits  CBC WITH DIFFERENTIAL/PLATELET  COMPREHENSIVE METABOLIC PANEL  CK  ETHANOL    EKG EKG Interpretation Date/Time:  Friday March 06 2023 15:16:46 EDT Ventricular Rate:  84 PR Interval:  133 QRS Duration:  168 QT Interval:  438 QTC Calculation: 518 R Axis:   101  Text Interpretation: Sinus rhythm RBBB and LPFB Confirmed by Kristine Royal [phone]) on 03/06/2023 3:23:58 PM  Radiology DG Chest Portable 1 View  Result Date: 03/06/2023 CLINICAL DATA:  sob EXAM: PORTABLE CHEST 1 VIEW COMPARISON:  CXR 03/15/22 FINDINGS: The heart size and mediastinal contours are within normal limits. Both lungs are clear. The visualized skeletal structures are unremarkable. IMPRESSION: No focal airspace opacity Electronically Signed   By: Lorenza Cambridge M.D.   On: 03/06/2023 15:17    Procedures Procedures    Medications Ordered in ED Medications - No data to display  ED Course/ Medical Decision Making/ A&P                                 Medical Decision Making Amount and/or Complexity of Data Reviewed Labs: ordered. Radiology: ordered.    Medical Screen Complete  This patient presented to the ED with complaint of AMS.  This complaint involves an extensive number of treatment options. The initial differential diagnosis includes, but is not limited to, intracranial pathology, EtOH intoxication, polysubstance abuse, metabolic abnormality, etc.  This presentation is: Acute,  Chronic, Self-Limited, Previously Undiagnosed, Uncertain Prognosis, Complicated, Systemic Symptoms, and Threat to Life/Bodily Function  Patient presents after being found unresponsive in his home.  Patient initially with significant obtundation and respiratory depression.  On arrival the patient is arousable and following directions.  He does not appear to be in respiratory distress.  Patient was not intubated.  Patient with likely possible EtOH intoxication.  Screening labs ordered.  Screening imaging ordered.  Oncoming EDP is aware of case will evaluate for admission.   Additional history obtained:  External records from outside sources obtained and reviewed including prior ED visits and prior Inpatient records.   Problem List / ED Course:  AMS   Disposition:  After consideration of the diagnostic results and the patients response to treatment, I feel that the patent would benefit from completion ED evaluation.   CRITICAL CARE Performed by: Wynetta Fines   Total critical care time: 30 minutes  Critical care time was exclusive of separately billable procedures and treating other patients.  Critical care was necessary to treat or prevent imminent or life-threatening deterioration.  Critical care was time spent personally by me on the following activities: development of treatment plan with patient and/or surrogate as well as nursing, discussions with consultants, evaluation of patient's response to treatment, examination of patient, obtaining history from patient or surrogate, ordering and performing treatments and interventions, ordering and review of laboratory studies, ordering and review of radiographic studies, pulse oximetry and re-evaluation of patient's condition.         Final Clinical Impression(s) / ED Diagnoses Final diagnoses:  Altered mental status, unspecified altered mental status type    Rx / DC Orders ED Discharge Orders     None          Wynetta Fines, MD 03/06/23 1606

## 2023-03-06 NOTE — H&P (Incomplete)
Hospital Admission History and Physical Service Pager: [phone]  Patient name: Carlos Montoya Medical record number: [id number] Date of Birth: [birth year] Age: 62 y.o. Gender: male  Primary Care Provider: Elberta Fortis, MD Consultants: None Code Status: FULL, which was confirmed with patient at bedside Preferred Emergency Contact: Contact Information     Name Relation Home Work [place] Mother   [phone]      Other Contacts     Name Relation Home Work Mobile   Gilbert Neighbor   [phone]   [place], [place]   [phone]      Chief Complaint: Altered mental status  Assessment and Plan: [name] is a 62 y.o. male presenting with altered mental status in the setting of alcohol intoxication, but also found to be persistently hypoxic.  Differential for this presentation includes aspiration pneumonia, pulmonary embolism, COPD exacerbation, CAP.  Leading item on differential is chemical versus bacterial pneumonitis following aspiration.  Will continue antibiotics given persistent hypoxia and symptomatic pneumonitis with concern for possible bacterial aspiration in the setting of severe inebriation.  However, do not favor prolonged course of empiric antibiotics if patient rapidly improves and would suspect chemical pneumonitis rather than bacterial in that case.  Given negative CTA, PE is ruled out.  Unclear if patient has diagnosis COPD with PFTs, no wheezing on exam and no productive cough make this much less likely.  Admitting patient for monitoring overnight, possible discharge in the morning if hypoxia resolves and patient is improving. Assessment & Plan Hypoxia Did desat again and was placed on oxygen, will continue monitor through the morning and ambulate with RN prior to discharge.  Likely secondary to respiratory depression in the setting of EtOH intoxication, though still considering for chemical aspiration pneumonitis vs  bacterial aspiration pneumonia. -Admit to FMTS, med/tele, attending Dr. Janit Pagan -Continue Unasyn for possible aspiration pneumonia, discontinue if rapidly improving in AM, could discharge with oral meds if needed -Continuous pulse ox -SpO2 to comfort, goal saturation greater than 92%, unclear if patient has actual diagnosis of COPD -No IVF, s/p 1 L bolus, euvolemic, tolerating PO hydration, regular diet -Caution with QT prolonging meds, QTc 518 -Ambulate with RN prior to discharge to ensure no desat, can go home if hypoxia resolves in AM Alcoholic intoxication without complication (HCC) Given patient's past history of withdrawal and single seizure, will initiate CIWA precautions.  Last drink 8 hours ago, no concern for withdrawal at this time as it is too early.  Patient has had multiple ED visits for alcohol intoxication, suspect he has poor insight into his level of drinking.  Does indicate that he would like to cut back, but also states that he is self-medicating.  He took an unclear medication received from a neighbor, believe patient is at high risk for other substance use disorder.  Anion gap acidosis likely d/t EtOH. -Would benefit from outpatient follow-up with PCP for alcohol cessation -Follow-up UDS -CIWA precautions, no lorazepam -If he stays for some time for his hypoxia as above, will need to consider letting him withdraw vs giving him beer with dinner to ward off withdrawals. Really up to him based on how motivated he is to quit or not.  -Thiamine, folate supplementation -Falls precautions  Stable discharge: -Anxiety, depression: Amitriptyline 50 mg QHS -Chronic pain: Gabapentin 100 mg BID, baclofen 10 mg TID PRN -?COPD: Proventil nebulizer Q6h, unclear if actually diagnosed with PFTs -BPD: Lamotrigine 100 mg daily -GERD: Pantoprazole 40 mg daily  Discharge planning  Patient states he lost his wallet and keys, will need TOC assistance to get a ride back home.  Lives  alone, has no relatives and not many friends.  FEN/GI: PIV saline lock, regular diet VTE Prophylaxis: Lovenox starting tomorrow if remains hospitalized, does have falls risk  Disposition: Med-tele  History of Present Illness: Carlos Montoya is a 62 y.o. male with a pertinent PMH of alcohol use disorder, BPD, anxiety, PTSD, ?COPD presenting with alcohol intoxication and hypoxia.  Patient was brought by EMS after being found altered and hypoxic at home by neighbor.  In the ED, patient was hypoxic in 80s and was placed on 3-4 L of O2.  EtOH was 288 and CT head was unremarkable.  Anion gap was 24 and ABG showed pH of 7.2 on with CO2 of 50.  VBG improved after hydration with pH 7.33.  However, remained hypoxic on his 4 L of O2 and notably does not normally require O2 at home.  CXR unremarkable and CTA did not show PE.  Did have mild leukocytosis to 11.8, started on Unasyn for suspicion of aspiration.  FMTS consulted for admission for acute hypoxic respiratory failure.  The patient reports that he has been experiencing anxiety for the past few days, does not wish to discuss the reason for this anxiety when asked.  States that during this period of acute stress, he has been drinking more than usual "8-9 beers a day and some pain pills."  He notes that earlier today he took some unknown pain pills from his neighbor along with alcohol.  He reports that he has been "self-medicating" for his chronic pain and stress.  States that he normally drinks "a couple beers here and there."  Stated he has been having some shakes today, reports he is experienced alcohol withdrawals in the past and once had a alcohol withdrawal seizure "a long time ago."  His last drink was shortly prior to coming to the hospital, approximately 8 hours ago.  Denies shortness of breath, chest pain.  Reports he is very dizzy (clarifies this is lightheadedness versus vertigo) and anxious.  Review Of Systems: Per HPI.  Pertinent Past  Medical History: -Alcohol use disorder -Hepatitis C history s/p treatment in 2018 -?COPD -Depression -BPD -Anxiety  Remainder reviewed in history tab.   Pertinent Past Surgical History: -Umbilical hernia repair with mesh September 2023, notes he has been having problems for surgery and wants it removed  Remainder reviewed in history tab.  Pertinent Social History: Tobacco use: ~1/2 PPD Alcohol use: 1-2 beers per day, recently 8-9 beers Other Substance use: Marijuana, history of opioid use disorder but no opiates for several months Lives alone, not much social support.  States his mother is his emergency contact but they are not speaking very much these days.  Pertinent Family History: -None  Remainder reviewed in history tab.   Important Outpatient Medications: -Baclofen 10 mg TID, amitriptyline 50 mg QHS, gabapentin 600 mg QHS from PM&R -Lamotrigine 100 mg QD from Psych -Omeprazole 20 mg  Remainder reviewed in medication history.   Objective: BP 107/73   Pulse 83   Temp 97.9 F (36.6 C) (Oral)   Resp 11   SpO2 (!) 86%   Physical Exam: General: Adult male, appears older than stated age.  Resting in bed, no acute distress. Eyes: Unable to evaluate, patient states he does not want lights to be turned on. ENTM: MMM. Neck: Supple, full passive ROM. Cardiovascular: RRR.  Normal S1/2.  No murmurs/rubs or  gallops. Respiratory: Normal work of breathing on room air, did later desaturate to 87%.  CTAB, no wheezes/crackles. Gastrointestinal: Normoactive bowel sounds.  Mild tenderness to palpation globally.  No HSM, no masses felt.  Some mild right lower quadrant distention close to umbilicus.  Scarred umbilicus consistent with prior surgery. MSK: Moving all extremities appropriately. Extremities: Capillary refill 2 seconds.  2+ radial, DP pulses.  No peripheral edema bilaterally. Derm: No rash aggressive. Neuro: Alert and oriented to self, place, situation, unaware of the  time. Psych: Answers questions appropriately, not agitated, conversational, mildly anxious, no SI/HI.  Labs:  CBC BMET  Recent Labs  Lab 03/06/23 1447 03/06/23 1455 03/06/23 1944  WBC 11.8*  --   --   HGB 15.4   < > 14.3  HCT 45.5   < > 42.0  PLT 384  --   --    < > = values in this interval not displayed.   Recent Labs  Lab 03/06/23 1447 03/06/23 1455 03/06/23 1456 03/06/23 1944  NA 138 138  138   < > 139  K 3.2* 3.0*  3.0*   < > 4.7  CL 97* 102  --   --   CO2 17*  --   --   --   BUN 8 7*  --   --   CREATININE 1.22 1.20  --   --   GLUCOSE 183* 180*  --   --   CALCIUM 8.6*  --   --   --    < > = values in this interval not displayed.     Pertinent additional labs: -ABG: pH 7.23, CO2 50 -Later VBG: pH 7.33, CO2 52 -EtOH 288 -AST/ALT 34/19 -Anion gap 24 -CK WNL -RPP pending  EKG: Sinus rhythm, RBBB (QRS 168), QTc 518  Imaging Studies Performed: -CXR: No focal consolidation, no pulmonary congestion, no effusions, no airspace opacities. -CT head: No acute intracranial abnormalities -CT C spine: Normal alignment of C spine -CTA chest: No PE, bilateral atelectasis, pneumonitis.  Independently reviewed and agree with radiologist's interpretation.   Vaudie Engebretsen Sharion Dove, MD 03/06/2023, 9:06 PM [facility]  FPTS Intern pager: [phone], text pages welcome Secure chat group [facility] Teaching Service    I have evaluated this patient along with Dr. Sharion Dove and reviewed the above note, making necessary revisions.  Dorothyann Gibbs, MD 03/07/2023, 12:19 AM PGY-3, [facility]

## 2023-03-06 NOTE — H&P (Incomplete)
Hospital Admission History and Physical Service Pager: [phone]  Patient name: Carlos Montoya Medical record number: [id number] Date of Birth: [birth year] Age: 62 y.o. Gender: male  Primary Care Provider: Elberta Fortis, MD Consultants: None Code Status: FULL, which was confirmed with patient at bedside Preferred Emergency Contact: Contact Information     Name Relation Home Work [place] Mother   [phone]      Other Contacts     Name Relation Home Work Mobile   [place] Neighbor   [phone]   Baldo, Gershon   [phone]      Chief Complaint: Altered mental status  Assessment and Plan: Carlos Montoya is a 62 y.o. male presenting with altered mental status in the setting of alcohol intoxication, but also found to be persistently hypoxic.  Differential for this presentation includes aspiration pneumonia, pulmonary embolism, COPD exacerbation, CAP.  Given negative CTA, PE is ruled out.  Will continue antibiotics given persistent hypoxia and symptomatic pneumonitis with concern for possible bacterial aspiration in the setting of severe inebriation.  However, do not favor prolonged course of empiric antibiotics if patient rapidly improves and would suspect chemical pneumonitis rather than bacterial in that case.  Admitting patient for monitoring overnight, possible discharge in the morning if hypoxia resolves and patient is improving. Assessment & Plan Hypoxia Did desat again and was placed on oxygen, will continue monitor through the morning and ambulate with RN prior to discharge.  Likely secondary to respiratory depression in the setting of EtOH intoxication, though still considering for aspiration pneumonia. -Admit to FMTS, med/tele, attending Dr. Janit Pagan -Continue Unasyn for possible aspiration pneumonia, discontinue if rapidly improving in AM, could discharge with oral meds if needed -Continuous pulse ox -SpO2 to comfort,  goal saturation greater than 92%, unclear if patient has actual diagnosis of COPD -No IVF, euvolemic, tolerating PO hydration, regular diet -Caution with QT prolonging meds, QTc 518 -Ambulate with RN prior to discharge to ensure no desat, can go home if hypoxia resolves in AM Alcoholic intoxication without complication (HCC) Given patient's past history of withdrawal and single seizure, will initiate CIWA precautions.  Last drink 8 hours ago, no concern for withdrawal at this time as it is too early.  Patient has had multiple ED visits for alcohol intoxication, suspect he has poor insight into his level of drinking.  Does indicate that he would like to cut back, but also states that he is self-medicating.  He is unclear medication from neighbor, believe patient is at high risk for other substance use disorder.  Anion gap acidosis likely d/t EtOH. -Would benefit from outpatient follow-up with PCP for alcohol cessation -Follow-up UDS -CIWA precautions, no lorazepam -Thiamine, folate supplementation -Falls precautions  Stable discharge: -Anxiety, depression: Amitriptyline 50 mg QHS -Chronic pain: Gabapentin 100 mg BID, baclofen 10 mg TID PRN -?COPD: Proventil nebulizer Q6h, unclear if actually diagnosed with PFTs -BPD: Lamotrigine 100 mg daily -GERD: Pantoprazole 40 mg daily  Discharge planning Patient states he lost his wallet and keys, will need TOC assistance to get a ride back home.  Lives alone, has no relatives and not many friends.  FEN/GI: PIV saline lock, regular diet VTE Prophylaxis: Lovenox starting tomorrow if remains hospitalized, does have falls risk  Disposition: Med-tele  History of Present Illness: Carlos Montoya is a 62 y.o. male with a pertinent PMH of alcohol use disorder, BPD, anxiety, PTSD, ?COPD presenting with alcohol intoxication and hypoxia.  Patient was brought by EMS after being  found altered and hypoxic at home by neighbor.  In the ED, patient was  hypoxic in 80s and was placed on 3-4 L of O2.  EtOH was 288 and CT head was unremarkable.  Anion gap was 24 and ABG showed pH of 7.2 on with CO2 of 50.  VBG improved after hydration with pH 7.33.  However, remained hypoxic on his 4 L of O2 and notably does not normally require O2 at home.  CXR unremarkable and CTA did not show PE.  Did have mild leukocytosis to 11.8, started on Unasyn for suspicion of aspiration.  FMTS consulted for admission for acute hypoxic respiratory failure.  The patient reports that he has been experiencing anxiety for the past few days, does not wish to discuss the reason for this anxiety when asked.  States that during this period of acute stress, he has been drinking more than usual "8-9 beers a day and some pain pills."  He notes that earlier today he took some unknown pain pills from his neighbor along with alcohol.  He reports that he has been "self-medicating" for his chronic pain and stress.  States that he normally drinks "a couple beers here and there."  Stated he has been having some shakes today, reports he is experienced alcohol withdrawals in the past and once had a alcohol withdrawal seizure "a long time ago."  His last drink was shortly prior to coming to the hospital, approximately 8 hours ago.  Denies shortness of breath, chest pain.  Reports he is very dizzy (clarifies this is lightheadedness versus vertigo) and anxious.  Review Of Systems: Per HPI.  Pertinent Past Medical History: -Alcohol use disorder -Hepatitis C history s/p treatment in 2018 -?COPD -Depression -BPD -Anxiety  Remainder reviewed in history tab.   Pertinent Past Surgical History: -Umbilical hernia repair with mesh September 2023, notes he has been having problems for surgery and wants it removed  Remainder reviewed in history tab.  Pertinent Social History: Tobacco use: ~1/2 PPD Alcohol use: 1-2 beers per day, recently 8-9 beers Other Substance use: Marijuana, history of opioid use  disorder but no opiates for several months Lives alone, not much social support.  States his mother is his emergency contact but they are not speaking very much these days.  Pertinent Family History: -None  Remainder reviewed in history tab.   Important Outpatient Medications: -Baclofen 10 mg TID, amitriptyline 50 mg QHS, gabapentin 600 mg QHS from PM&R -Lamotrigine 100 mg QD from Psych -Omeprazole 20 mg  Remainder reviewed in medication history.   Objective: BP 107/73   Pulse 83   Temp 97.9 F (36.6 C) (Oral)   Resp 11   SpO2 (!) 86%   Physical Exam: General: Adult male, appears older than stated age.  Resting in bed, no acute distress. Eyes: Unable to evaluate, patient states he does not want lights to be turned on. ENTM: MMM. Neck: Supple, full passive ROM. Cardiovascular: RRR.  Normal S1/2.  No murmurs/rubs or gallops. Respiratory: Normal work of breathing on room air, did later desaturate to 87%.  CTAB, no wheezes/crackles. Gastrointestinal: Normoactive bowel sounds.  Mild tenderness to palpation globally.  No HSM, no masses felt.  Some mild right lower quadrant distention close to umbilicus.  Scarred umbilicus consistent with prior surgery. MSK: Moving all extremities appropriately. Extremities: Capillary refill 2 seconds.  2+ radial, DP pulses.  No peripheral edema bilaterally. Derm: No rash aggressive. Neuro: Alert and oriented to self, place, situation, unaware of the time. Psych: Answers  questions appropriately, not agitated, conversational, mildly anxious, no SI/HI.  Labs:  CBC BMET  Recent Labs  Lab 03/06/23 1447 03/06/23 1455 03/06/23 1944  WBC 11.8*  --   --   HGB 15.4   < > 14.3  HCT 45.5   < > 42.0  PLT 384  --   --    < > = values in this interval not displayed.   Recent Labs  Lab 03/06/23 1447 03/06/23 1455 03/06/23 1456 03/06/23 1944  NA 138 138  138   < > 139  K 3.2* 3.0*  3.0*   < > 4.7  CL 97* 102  --   --   CO2 17*  --   --   --    BUN 8 7*  --   --   CREATININE 1.22 1.20  --   --   GLUCOSE 183* 180*  --   --   CALCIUM 8.6*  --   --   --    < > = values in this interval not displayed.     Pertinent additional labs: -ABG: pH 7.23, CO2 50 -Later VBG: pH 7.33, CO2 52 -EtOH 288 -AST/ALT 34/19 -Anion gap 24 -CK WNL -RPP pending  EKG: Sinus rhythm, profound RBBB (QRS 168), QTc 518  Imaging Studies Performed: -CXR: No focal consolidation, no pulmonary congestion, no effusions, no airspace opacities. -CT head: No acute intracranial abnormalities -CT C spine: Normal alignment of C spine -CTA chest: No PE, bilateral atelectasis, pneumonitis.   Ifeanyichukwu Wickham Sharion Dove, MD 03/06/2023, 9:06 PM [facility]  FPTS Intern pager: [phone], text pages welcome Secure chat group [facility] Teaching Service

## 2023-03-06 NOTE — ED Notes (Signed)
Placed on 4L d/t O2 86%

## 2023-03-06 NOTE — ED Notes (Signed)
Patient transported to CT 

## 2023-03-06 NOTE — ED Provider Notes (Signed)
  Physical Exam  BP 107/73   Pulse 83   Temp 97.9 F (36.6 C) (Oral)   Resp 11   SpO2 (!) 86%   Physical Exam  Procedures  Procedures  ED Course / MDM    Medical Decision Making Care assumed at 3 PM.  Patient is a chronic alcoholic.  Patient was found altered and hypoxic at home.  Initial VBG showed acidosis with pH of 7.2 with CO2 of 50.  Patient signed out pending labs and alcohol level and CT head.  8:49 PM CT head unremarkable.  Alcohol level is 288.  Patient does have an anion gap of 24.  Patient has been observed in the ED for 5 hours.  However patient continues to desat to the 80s.  CTA did not show any PE.  I suspect that patient aspirated.  I ordered Unasyn.  At this point family practice to admit.  CRITICAL CARE Performed by: Richardean Canal   Total critical care time: 37 minutes  Critical care time was exclusive of separately billable procedures and treating other patients.  Critical care was necessary to treat or prevent imminent or life-threatening deterioration.  Critical care was time spent personally by me on the following activities: development of treatment plan with patient and/or surrogate as well as nursing, discussions with consultants, evaluation of patient's response to treatment, examination of patient, obtaining history from patient or surrogate, ordering and performing treatments and interventions, ordering and review of laboratory studies, ordering and review of radiographic studies, pulse oximetry and re-evaluation of patient's condition.   Problems Addressed: Alcoholic intoxication without complication (HCC): acute illness or injury Altered mental status, unspecified altered mental status type: acute illness or injury Hypoxia: acute illness or injury  Amount and/or Complexity of Data Reviewed Labs: ordered. Decision-making details documented in ED Course. Radiology: ordered and independent interpretation performed. Decision-making details documented  in ED Course.  Risk Prescription drug management. Decision regarding hospitalization.          Charlynne Pander, MD 03/06/23 [phone]

## 2023-03-07 ENCOUNTER — Encounter (HOSPITAL_COMMUNITY): Payer: Self-pay | Admitting: Family Medicine

## 2023-03-07 DIAGNOSIS — Z1152 Encounter for screening for COVID-19: Secondary | ICD-10-CM | POA: Diagnosis not present

## 2023-03-07 DIAGNOSIS — B079 Viral wart, unspecified: Secondary | ICD-10-CM | POA: Insufficient documentation

## 2023-03-07 DIAGNOSIS — J9601 Acute respiratory failure with hypoxia: Principal | ICD-10-CM

## 2023-03-07 DIAGNOSIS — F10920 Alcohol use, unspecified with intoxication, uncomplicated: Secondary | ICD-10-CM | POA: Diagnosis not present

## 2023-03-07 DIAGNOSIS — L989 Disorder of the skin and subcutaneous tissue, unspecified: Secondary | ICD-10-CM | POA: Insufficient documentation

## 2023-03-07 LAB — RAPID URINE DRUG SCREEN, HOSP PERFORMED
Amphetamines: NOT DETECTED
Barbiturates: NOT DETECTED
Benzodiazepines: NOT DETECTED
Cocaine: NOT DETECTED
Opiates: NOT DETECTED
Tetrahydrocannabinol: POSITIVE — AB

## 2023-03-07 LAB — TROPONIN I (HIGH SENSITIVITY): Troponin I (High Sensitivity): 7 ng/L (ref <18)

## 2023-03-07 LAB — HIV ANTIBODY (ROUTINE TESTING W REFLEX): HIV Screen 4th Generation wRfx: NONREACTIVE

## 2023-03-07 MED ORDER — HYDROXYZINE HCL 25 MG PO TABS
25.0000 mg | ORAL_TABLET | Freq: Four times a day (QID) | ORAL | Status: DC | PRN
  Administered 2023-03-07 – 2023-03-09 (×4): 25 mg via ORAL
  Filled 2023-03-07 (×4): qty 1

## 2023-03-07 MED ORDER — ONDANSETRON 4 MG PO TBDP: 4.0000 mg | ORAL_TABLET | Freq: Four times a day (QID) | ORAL | Status: DC | PRN

## 2023-03-07 MED ORDER — LOPERAMIDE HCL 2 MG PO CAPS: 2.0000 mg | ORAL_CAPSULE | ORAL | Status: DC | PRN

## 2023-03-07 MED ORDER — THIAMINE MONONITRATE 100 MG PO TABS
100.0000 mg | ORAL_TABLET | Freq: Every day | ORAL | Status: DC
  Administered 2023-03-08 – 2023-03-09 (×2): 100 mg via ORAL
  Filled 2023-03-07 (×2): qty 1

## 2023-03-07 MED ORDER — CHLORDIAZEPOXIDE HCL 25 MG PO CAPS: 25.0000 mg | ORAL_CAPSULE | Freq: Four times a day (QID) | ORAL | Status: DC | PRN

## 2023-03-07 MED ORDER — SODIUM CHLORIDE 0.9 % IV SOLN
3.0000 g | Freq: Four times a day (QID) | INTRAVENOUS | Status: DC
  Administered 2023-03-07: 3 g via INTRAVENOUS
  Filled 2023-03-07: qty 8

## 2023-03-07 MED ORDER — ADULT MULTIVITAMIN W/MINERALS CH
1.0000 | ORAL_TABLET | Freq: Every day | ORAL | Status: DC
  Administered 2023-03-07 – 2023-03-09 (×3): 1 via ORAL
  Filled 2023-03-07 (×3): qty 1

## 2023-03-07 MED ORDER — CHLORDIAZEPOXIDE HCL 25 MG PO CAPS
25.0000 mg | ORAL_CAPSULE | Freq: Four times a day (QID) | ORAL | Status: AC
  Administered 2023-03-07 (×4): 25 mg via ORAL
  Filled 2023-03-07 (×4): qty 1

## 2023-03-07 MED ORDER — CHLORDIAZEPOXIDE HCL 25 MG PO CAPS
25.0000 mg | ORAL_CAPSULE | Freq: Three times a day (TID) | ORAL | Status: AC
  Administered 2023-03-08 (×3): 25 mg via ORAL
  Filled 2023-03-07 (×3): qty 1

## 2023-03-07 MED ORDER — CHLORDIAZEPOXIDE HCL 25 MG PO CAPS
25.0000 mg | ORAL_CAPSULE | Freq: Two times a day (BID) | ORAL | Status: DC
  Administered 2023-03-09: 25 mg via ORAL
  Filled 2023-03-07: qty 1

## 2023-03-07 MED ORDER — CHLORDIAZEPOXIDE HCL 25 MG PO CAPS: 25.0000 mg | ORAL_CAPSULE | Freq: Every day | ORAL | Status: DC

## 2023-03-07 MED ORDER — ALBUTEROL SULFATE (2.5 MG/3ML) 0.083% IN NEBU: 2.5000 mg | INHALATION_SOLUTION | Freq: Four times a day (QID) | RESPIRATORY_TRACT | Status: DC | PRN

## 2023-03-07 MED ORDER — ACETAMINOPHEN 325 MG PO TABS
650.0000 mg | ORAL_TABLET | Freq: Once | ORAL | Status: AC
  Administered 2023-03-08: 650 mg via ORAL
  Filled 2023-03-07 (×2): qty 2

## 2023-03-07 NOTE — Assessment & Plan Note (Deleted)
Did desat again and was placed on oxygen, will continue monitor through the morning and ambulate with RN prior to discharge.  Likely secondary to respiratory depression in the setting of EtOH intoxication, though still considering for chemical aspiration pneumonitis vs bacterial aspiration pneumonia. -Admit to FMTS, med/tele, attending Dr. Janit Pagan -Continue Unasyn for possible aspiration pneumonia, discontinue if rapidly improving in AM, could discharge with oral meds if needed -Continuous pulse ox -SpO2 to comfort, goal saturation greater than 92%, unclear if patient has actual diagnosis of COPD -No IVF, s/p 1 L bolus, euvolemic, tolerating PO hydration, regular diet -Caution with QT prolonging meds, QTc 518 -Ambulate with RN prior to discharge to ensure no desat, can go home if hypoxia resolves in AM

## 2023-03-07 NOTE — Progress Notes (Signed)
Pharmacy Antibiotic Note  Carlos Montoya is a 62 y.o. male admitted on 03/06/2023 with concern for chemical aspiration pneumonitis vs bacterial aspiration pneumonia.  Pharmacy has been consulted for Unasyn dosing.  Plan: Unasyn 3g IV Q6H.  Temp (24hrs), Avg:98 F (36.7 C), Min:97.7 F (36.5 C), Max:98.5 F (36.9 C)  Recent Labs  Lab 03/06/23 1447 03/06/23 1455  WBC 11.8*  --   CREATININE 1.22 1.20     Allergies  Allergen Reactions   Prednisone Hives   Diphenhydramine Hcl Other (See Comments)    Has opposite effect   Tylenol [Acetaminophen] Other (See Comments)    Told to avoid due to having hepatitis C previously    Thank you for allowing pharmacy to be a part of this patient's care.  Vernard Gambles, PharmD, BCPS  03/07/2023 1:06 AM

## 2023-03-07 NOTE — Plan of Care (Signed)

## 2023-03-07 NOTE — Assessment & Plan Note (Addendum)
Now satting well on room air, discontinuing Unasyn.  Favor chemical pneumonitis as cause of hypoxia.  Will observe patient and ambulate with PT, but believe respiratory failure to be resolved at this time. -d/c Unasyn today -Continuous pulse ox -SpO2 to comfort, goal saturation greater than 92%, unclear if patient has actual diagnosis of COPD -Caution with QT prolonging meds, QTc 518 -PT/OT eval and ambulate prior to discharge to ensure no desat

## 2023-03-07 NOTE — Progress Notes (Signed)
FMTS Brief Progress Note  S:Received secure chat from RN that patient having CP, she noted she got EKG that showed RBB. Went to see patient, who was sleeping comfortably and said CP had resolved. He noted it had been on left side of chest/radiate to shoulder. He noted an episode yesterday that was short lived.    O: BP (!) 108/56 (BP Location: Left Arm)   Pulse 95   Temp 98 F (36.7 C) (Oral)   Resp 17   Ht 5\' 11"  (1.803 m)   Wt 63.5 kg   SpO2 92%   BMI 19.53 kg/m   General: Well appearing, NAD, elderly, frail Card: S1/S2, RRR, [facility] Pulm: CATBL  A/P: Chest pain Patient complained of CP earlier in night, but was resting comfortably in bed, noting it had resolved, when provider went to see patient. EKG showed ST depressions in V1-V3, but given resolution of symptoms, low concern for evolving ACS/PE. Will obtain trops. If elevated, will reach out to cardiology.  -Trop Stat   EtOH Intoxication CIWA 0 at 6pm, continue to monitor. -CIWA q6h  - Orders reviewed. Labs for AM ordered, which was adjusted as needed.   Bess Kinds, MD 03/07/2023, 8:49 PM PGY-3, [facility] Night Resident  Please page [phone] with questions.

## 2023-03-07 NOTE — Assessment & Plan Note (Signed)
Nonmelanotic based on ABCDE criteria, photo in physical exam below.  Suspect viral etiology, no concern for syphilis or acute bacterial infection. -Follow up outpatient

## 2023-03-07 NOTE — Progress Notes (Signed)
Daily Progress Note Intern Pager: [phone]  Patient name: Carlos Montoya Medical record number: [id number] Date of birth: Nov 25, 1960 Age: 62 y.o. Gender: male  Primary Care Provider: Elberta Fortis, MD Consultants: None Code Status: Full  Pt Overview and Major Events to Date:  8/30 - Admitted, hypoxic, started Unasyn 8/31 - Saturating well on room air  Assessment and Plan: Carlos Montoya is a 62 y.o. male with a pertinent PMH of alcohol use disorder, BPD, anxiety, PTSD, ?COPD who presented with alcohol intoxication and hypoxia and was admitted for acute hypoxic respiratory failure, currently oxygenating well on room air, but now staying for alcohol withdrawal.  Given his history of withdrawal seizures, would benefit from observation period and possible benzo taper.  He is motivated to quit and to stay for detox. Assessment & Plan Acute hypoxic respiratory failure (HCC) Now satting well on room air, discontinuing Unasyn.  Favor chemical pneumonitis as cause of hypoxia.  Will observe patient and ambulate with PT, but believe respiratory failure to be resolved at this time. -d/c Unasyn today -Continuous pulse ox -SpO2 to comfort, goal saturation greater than 92%, unclear if patient has actual diagnosis of COPD -Caution with QT prolonging meds, QTc 518 -PT/OT eval and ambulate prior to discharge to ensure no desat Alcoholic intoxication without complication (HCC) CIWAs 0>2 overnight.  Last drink 8/30 around 2 pm.  Will be staying for withdrawal period given Hx w/d seizures. -CIWA precautions, no lorazepam, consider taper if symptomatic -Falls precautions -Thiamine, folate supplementation Verruca Nonmelanotic based on ABCDE criteria, photo in physical exam below.  Suspect viral etiology, no concern for syphilis or acute bacterial infection. -Follow up outpatient  Chronic and Stable Problems: -Anxiety, depression: Amitriptyline 50 mg QHS -Chronic pain: Gabapentin  100 mg BID, baclofen 10 mg TID PRN -?COPD: Proventil nebulizer Q6h, unclear if actually diagnosed with PFTs -BPD: Lamotrigine 100 mg daily -GERD: Pantoprazole 40 mg daily  FEN/GI: PIV saline lock, regular diet VTE Prophylaxis: Lovenox starting 2200 if remains hospitalized, does have falls risk Dispo: Home pending clinical improvement . Barriers: needs TOC assistance with getting ride home.  Subjective:  This morning, the patient is very anxious and requests to stay another day to be observed and help deal with alcohol withdrawal.  Continues to worry about lack of money or keys.  Also expresses overt concern about his labs.  States that he feels some shakes.  Endorses drinking more than he had mentioned previously.  Also states that he has a new wart or lesion on the upper part of his buttocks that he first noticed a few weeks ago.  Wants it looked at because he is worried about it.  It is not itchy, bleeding, draining.  Objective: Temp:  [97.7 F (36.5 C)-98.5 F (36.9 C)] 97.7 F (36.5 C) (08/31 0101) Pulse Rate:  [79-100] 88 (08/31 0101) Resp:  [7-20] 17 (08/31 0101) BP: (97-132)/(63-84) 131/81 (08/31 0101) SpO2:  [84 %-100 %] 95 % (08/31 0101) Weight:  [63.5 kg] 63.5 kg (08/31 0101)  Physical Exam: General: Adult male, resting in bed, NAD, A&Ox4. HEENT: Normocephalic, atraumatic. Some conjunctival erythema.  MMM. Cardiovascular: Regular rate and rhythm. Normal S1/S2. No murmurs, rubs, or gallops appreciated. 2+ radial pulses. Pulmonary: Clear bilaterally to ascultation. No increased WOB, no accessory muscle usage on room air. No wheezes, rales, or crackles. Abdominal: Normoactive bowel sounds.  Nontender to palpation.  No rebound or guarding.  Some scarring of umbilicus and a bit of distension R lateral to umbilicus  at side of mesh placement.  No HSM. Skin: Warm and dry.  Gluteal cleft papule, 1-2 cm in diameter, nonpainful, no erythema or bleeding.  Symmetric, regular borders,  uniform color.  See photo below. Extremities: No peripheral edema bilaterally. Capillary refill 2 seconds. Psych: Anxious affect, converses appropriately.  Somewhat agitated, but able to lie in bed to rest.  No SI/HI.    Laboratory: Most recent CBC Lab Results  Component Value Date   WBC 11.8 (H) 03/06/2023   HGB 14.3 03/06/2023   HCT 42.0 03/06/2023   MCV 102.0 (H) 03/06/2023   PLT 384 03/06/2023   Most recent BMP    Latest Ref Rng & Units 03/06/2023    7:44 PM  BMP  Sodium 135 - 145 mmol/L 139   Potassium 3.5 - 5.1 mmol/L 4.7     Other pertinent labs: -UDS +THC  New Imaging/Diagnostic Tests: -None  Carlos Sprinkle, MD 03/07/2023, 1:44 AM [facility]  FMTS Intern pager: [phone], text pages welcome Secure chat group [facility] Teaching Service

## 2023-03-07 NOTE — Plan of Care (Signed)

## 2023-03-07 NOTE — Hospital Course (Addendum)
Carlos Montoya is a 62 y.o. male presenting with altered mental status in the setting of alcohol intoxication, but also found to be persistently hypoxic and was admitted to the [facility] Medicine Teaching service for acute respiratory failure possibly 2/2 aspiration pneumonitis.  He was stayed for overnight observation and was discharged ***after his hypoxemia resolved.  Hypoxia In the ED, patient was hypoxic in the 80s and was placed on 4 L O2.  ABG showed pH of 7.2 on with CO2 of 50, but VBG improved after hydration with pH 7.33.  Patient continued to desat without supplemental O2 and was admitted overnight.  In the morning, patient was able to ambulate with RN unit while maintaining good oxygenation on room air***.  Alcoholic intoxication Patient presented with EtOH of 288 with anion gap (24) acidosis.  Patient reported he had been drinking "8-9 beers a day and some pain pills" to control his back pain better.  UDS only positive for THC.  Placed patient on CIWA precautions without lorazepam and scores were *** prior to discharge.  Other chronic conditions were medically managed with home medications and formulary alternatives as necessary (anxiety, depression, BPD, chronic pain, GERD, ?COPD).  PCP Follow-up Recommendations: Discuss alcohol cessation, patient somewhat motivated to quit but endorses self-medicating due to uncontrolled back pain

## 2023-03-07 NOTE — Evaluation (Signed)
Physical Therapy Evaluation Patient Details Name: Carlos Montoya MRN: [id number] DOB: [birth year] Today's Date: 03/07/2023  History of Present Illness  Pt is a 62 y/o male admitted for acute hypoxic respiratory failure and alcohol intoxication.  PMH:  alcohol use d/o, BPD, anxiety, PTSD, COPD  Clinical Impression  Pt is close to baseline functioning with chronic weakness and pain He should be safe at home with limited assist. There are no further acute PT needs.  Will sign off at this time.         If plan is discharge home, recommend the following:     Can travel by private vehicle        Equipment Recommendations None recommended by PT  Recommendations for Other Services       Functional Status Assessment       Precautions / Restrictions Precautions Precautions: Other (comment) (low fall risk)      Mobility  Bed Mobility Overal bed mobility: Modified Independent                  Transfers Overall transfer level: Modified independent                 General transfer comment: uses UE's appropriately and cane adequately safely    Ambulation/Gait Ambulation/Gait assistance: Modified independent (Device/Increase time) Gait Distance (Feet): 400 Feet Assistive device: Straight cane Gait Pattern/deviations: Step-through pattern   Gait velocity interpretation: 1.31 - 2.62 ft/sec, indicative of limited community ambulator   General Gait Details: generally stable, adequately safe use of the cane.  Pt able to scan, change direction abruptly, back up and change speeds.  Stairs            Wheelchair Mobility     Tilt Bed    Modified Rankin (Stroke Patients Only)       Balance Overall balance assessment: Mild deficits observed, not formally tested                                           Pertinent Vitals/Pain Pain Assessment Pain Assessment: Faces Faces Pain Scale: Hurts little more Pain Location: back, legs are  variable, better than on admission Pain Descriptors / Indicators: Discomfort Pain Intervention(s): Monitored during session    Home Living Family/patient expects to be discharged to:: Private residence Living Arrangements: Alone Available Help at Discharge: Neighbor;Other (Comment) (limited assist from neighbors) Type of Home: Apartment Home Access: Stairs to enter;Level entry       Home Layout: One level Home Equipment: Cane - single point      Prior Function Prior Level of Function : Independent/Modified Independent                     Extremity/Trunk Assessment   Upper Extremity Assessment Upper Extremity Assessment: Overall WFL for tasks assessed    Lower Extremity Assessment Lower Extremity Assessment: Overall WFL for tasks assessed (some proximal weakness, but functional)    Cervical / Trunk Assessment Cervical / Trunk Assessment: Normal  Communication   Communication Communication: No apparent difficulties  Cognition Arousal: Alert Behavior During Therapy: WFL for tasks assessed/performed Overall Cognitive Status: Within Functional Limits for tasks assessed (NT formally)  General Comments      Exercises     Assessment/Plan    PT Assessment All further PT needs can be met in the next venue of care  PT Problem List Decreased strength;Decreased mobility;Pain;Decreased coordination       PT Treatment Interventions      PT Goals (Current goals can be found in the Care Plan section)  Acute Rehab PT Goals Patient Stated Goal: home independent, intervention for my pain, back and neuropathy PT Goal Formulation: All assessment and education complete, DC therapy    Frequency       Co-evaluation               AM-PAC PT "6 Clicks" Mobility  Outcome Measure Help needed turning from your back to your side while in a flat bed without using bedrails?: None Help needed moving from lying  on your back to sitting on the side of a flat bed without using bedrails?: None Help needed moving to and from a bed to a chair (including a wheelchair)?: None Help needed standing up from a chair using your arms (e.g., wheelchair or bedside chair)?: None Help needed to walk in hospital room?: A Little Help needed climbing 3-5 steps with a railing? : A Little 6 Click Score: 22    End of Session   Activity Tolerance: Patient tolerated treatment well Patient left: in bed;with call bell/phone within reach Nurse Communication: Mobility status PT Visit Diagnosis: Other abnormalities of gait and mobility (R26.89)    Time: 1610-9604 PT Time Calculation (min) (ACUTE ONLY): 29 min   Charges:   PT Evaluation $PT Eval Moderate Complexity: 1 Mod PT Treatments $Gait Training: 8-22 mins PT General Charges $$ ACUTE PT VISIT: 1 Visit         03/07/2023  Jacinto Halim., PT Acute Rehabilitation Services [phone]  (office)  Eliseo Gum Emiah Pellicano 03/07/2023, 1:24 PM

## 2023-03-07 NOTE — Assessment & Plan Note (Addendum)
CIWAs 0>2 overnight.  Last drink 8/30 around 2 pm.  Will be staying for withdrawal period given Hx w/d seizures. -CIWA precautions, no lorazepam, consider taper if symptomatic -Falls precautions -Thiamine, folate supplementation

## 2023-03-08 DIAGNOSIS — J9601 Acute respiratory failure with hypoxia: Secondary | ICD-10-CM | POA: Diagnosis not present

## 2023-03-08 DIAGNOSIS — B079 Viral wart, unspecified: Secondary | ICD-10-CM | POA: Diagnosis not present

## 2023-03-08 DIAGNOSIS — F10920 Alcohol use, unspecified with intoxication, uncomplicated: Secondary | ICD-10-CM | POA: Diagnosis not present

## 2023-03-08 DIAGNOSIS — Z1152 Encounter for screening for COVID-19: Secondary | ICD-10-CM | POA: Diagnosis not present

## 2023-03-08 LAB — TROPONIN I (HIGH SENSITIVITY)
Troponin I (High Sensitivity): 5 ng/L (ref <18)
Troponin I (High Sensitivity): 5 ng/L (ref <18)
Troponin I (High Sensitivity): 7 ng/L (ref <18)

## 2023-03-08 MED ORDER — ACETAMINOPHEN 325 MG PO TABS
650.0000 mg | ORAL_TABLET | Freq: Once | ORAL | Status: DC
  Filled 2023-03-08: qty 2

## 2023-03-08 NOTE — Evaluation (Signed)
Occupational Therapy Evaluation Patient Details Name: Carlos Montoya MRN: [id number] DOB: [birth year] Today's Date: 03/08/2023   History of Present Illness Pt is a 62 y/o male admitted for acute hypoxic respiratory failure and alcohol intoxication.  PMH:  alcohol use d/o, BPD, anxiety, PTSD, COPD   Clinical Impression   PTA, pt lived alone and was independent; neighbors/friends provided rides for appointments, etc. Pt admits to errors in medication management at baseline. Upon eval, pt pleasant and agreeable to session with OT providing clear intentions to assess cognition and mobility.  Scoring an 8 on Short Blessed Test becoming easily irritable when asked to recall as well as state months of year in reverse order stating "I don't feel like doing this" and refusing to attempt months in reverse order. Pt does not use pillbox at home, but is aware he forgets medications among other memory difficulties. After SBT refusing mobility, but able to reach feet with figure 4 and per PT yesterday was independent in ambulation. Pt seemed slightly interested in OP OT, so recommending OP OT; pending progress/ability to learn compensatory strategies for medication management, may not need follow up OT after discharge.        If plan is discharge home, recommend the following: Assist for transportation;Direct supervision/assist for medications management    Functional Status Assessment  Patient has had a recent decline in their functional status and demonstrates the ability to make significant improvements in function in a reasonable and predictable amount of time.  Equipment Recommendations  None recommended by OT    Recommendations for Other Services       Precautions / Restrictions Restrictions Weight Bearing Restrictions: No      Mobility Bed Mobility Overal bed mobility: Modified Independent                  Transfers                   General transfer comment: Pt  deferred; per PT mod I for ambulation and transfers.      Balance Overall balance assessment: Mild deficits observed, not formally tested                                         ADL either performed or assessed with clinical judgement   ADL                                         General ADL Comments: suspect mod I for BADL. Eval limited due to pt refusing further mobility after donning socks; mod I for 400 ft with PT yesterday.     Vision         Perception         Praxis         Pertinent Vitals/Pain Pain Assessment Pain Assessment: Faces Faces Pain Scale: Hurts little more Pain Location: back Pain Descriptors / Indicators: Discomfort Pain Intervention(s): Limited activity within patient's tolerance, Monitored during session, Repositioned, Patient requesting pain meds-RN notified     Extremity/Trunk Assessment Upper Extremity Assessment Upper Extremity Assessment: Overall WFL for tasks assessed   Lower Extremity Assessment Lower Extremity Assessment: Overall WFL for tasks assessed   Cervical / Trunk Assessment Cervical / Trunk Assessment: Normal   Communication Communication Communication: No apparent difficulties   Cognition  Arousal: Alert Behavior During Therapy: WFL for tasks assessed/performed Overall Cognitive Status: No family/caregiver present to determine baseline cognitive functioning                                 General Comments: Pt scored an 8 on the short blessed test of cognition indicating cognitive impairment. Timing of eval appeared appropriate as he has been scoring a 0 on CIWA, but has still been receiving medication to help with withdrawal (Librium) per RN. Observed to become easily irritable despite inviting OT into room and initially agreeable to both cognitive and mobility testing. Suspect pt will improve as he detoxes, but he does admit to errors in his medication management at home      General Comments  easily irritable. With several complaints (pain, cold breakfast, milk not cold, etc) despite pt having ordered breakfast himself did not know breakfast had arrived (assume pt fell back asleep),    Exercises     Shoulder Instructions      Home Living Family/patient expects to be discharged to:: Private residence Living Arrangements: Alone Available Help at Discharge: Neighbor;Other (Comment) (limited assist from neighbors) Type of Home: Apartment Home Access: Stairs to enter;Level entry     Home Layout: One level     Bathroom Shower/Tub: Chief Strategy Officer: Handicapped height     Home Equipment: Cane - single point          Prior Functioning/Environment Prior Level of Function : Independent/Modified Independent             Mobility Comments: no AD ADLs Comments: Independent in the home and with grocery shopping. Neighbor/friends drive to grocery store and appts, pt on social security.        OT Problem List: Decreased strength;Decreased activity tolerance;Impaired balance (sitting and/or standing);Decreased cognition;Decreased knowledge of use of DME or AE      OT Treatment/Interventions: Self-care/ADL training;Therapeutic exercise;DME and/or AE instruction;Patient/family education;Visual/perceptual remediation/compensation;Therapeutic activities;Cognitive remediation/compensation    OT Goals(Current goals can be found in the care plan section) Acute Rehab OT Goals Patient Stated Goal: get better OT Goal Formulation: With patient Time For Goal Achievement: 03/22/23 Potential to Achieve Goals: Good  OT Frequency: Min 1X/week    Co-evaluation              AM-PAC OT "6 Clicks" Daily Activity     Outcome Measure Help from another person eating meals?: None Help from another person taking care of personal grooming?: None Help from another person toileting, which includes using toliet, bedpan, or urinal?: None Help from  another person bathing (including washing, rinsing, drying)?: None Help from another person to put on and taking off regular upper body clothing?: None Help from another person to put on and taking off regular lower body clothing?: None 6 Click Score: 24   End of Session Nurse Communication: Mobility status  Activity Tolerance: Other (comment);Patient tolerated treatment well (limited by irritability) Patient left: in bed;with call bell/phone within reach  OT Visit Diagnosis: Unsteadiness on feet (R26.81);Muscle weakness (generalized) (M62.81);Other symptoms and signs involving cognitive function                Time: 1610-9604 OT Time Calculation (min): 15 min Charges:  OT General Charges $OT Visit: 1 Visit OT Evaluation $OT Eval Low Complexity: 1 Low  Tyler Deis, OTR/L [facility] Acute Rehabilitation Office: [phone]   Myrla Halsted 03/08/2023, 9:31 AM

## 2023-03-08 NOTE — Assessment & Plan Note (Signed)
CIWAs 0 overnight. Back to neurologic baseline. Last drink 8/30 around 2 pm.  Will be staying for withdrawal period given Hx w/d seizures. -Continue librium taper -CIWA precautions -Falls precautions -Thiamine, folate supplementation

## 2023-03-08 NOTE — Assessment & Plan Note (Signed)
SORA, no longer on oxygen or abx. S/p unasyn. -Continuous pulse ox -SpO2 to comfort, goal saturation greater than 92%, unclear if patient has actual diagnosis of COPD -Caution with QT prolonging meds, QTc prolonged on admission -daily EKG -PT/OT eval  -ambulate to assess O2 requirement prior to d/c

## 2023-03-08 NOTE — Progress Notes (Signed)
     Daily Progress Note Intern Pager: [phone]  Patient name: Carlos Montoya Medical record number: [id number] Date of birth: Nov 07, 1960 Age: 62 y.o. Gender: male  Primary Care Provider: Elberta Fortis, MD Consultants: None Code Status: Full  Pt Overview and Major Events to Date:  8/30 - Admitted, hypoxic, started Unasyn 8/31 - Saturating well on room air   Assessment and Plan: Carlos Montoya is a 62 y.o. male with a pertinent PMH of alcohol use disorder, BPD, anxiety, PTSD, ?COPD who presented with alcohol intoxication and hypoxia and was admitted for acute hypoxic respiratory failure, currently oxygenating well on room air, but now staying for alcohol withdrawal.  Given his history of withdrawal seizures, would benefit from observation period and benzo taper.  He is motivated to quit and to stay for detox.  Assessment & Plan Acute hypoxic respiratory failure (HCC) SORA, no longer on oxygen or abx. S/p unasyn. -Continuous pulse ox -SpO2 to comfort, goal saturation greater than 92%, unclear if patient has actual diagnosis of COPD -Caution with QT prolonging meds, QTc prolonged on admission -daily EKG -PT/OT eval  -ambulate to assess O2 requirement prior to d/c Alcoholic intoxication without complication (HCC) CIWAs 0 overnight. Back to neurologic baseline. Last drink 8/30 around 2 pm.  Will be staying for withdrawal period given Hx w/d seizures. -Continue librium taper -CIWA precautions -Falls precautions -Thiamine, folate supplementation   Chronic and Stable Problems: -Chronic pain: Gabapentin 100 mg BID, baclofen 10 mg TID PRN -?COPD: Proventil nebulizer Q6h, unclear if actually diagnosed with PFTs -BPD: Lamotrigine 100 mg daily -GERD: Pantoprazole 40 mg daily   FEN/GI: reg diet PPx: lovenox Dispo:Home pending clinical improvement .  Subjective:  NAEON, does not quite feel ready to go home yet.  Having a headache this morning but willing to try  Tylenol.  Objective: Temp:  [97.7 F (36.5 C)-98.4 F (36.9 C)] 98 F (36.7 C) (08/31 1949) Pulse Rate:  [86-98] 95 (08/31 1949) Resp:  [16-19] 17 (08/31 1949) BP: (104-140)/(56-84) 108/56 (08/31 1949) SpO2:  [88 %-95 %] 92 % (08/31 1949) Weight:  [63.5 kg] 63.5 kg (08/31 0101) Physical Exam: General: NAD Cardiovascular: RRR no murmurs Respiratory: CTAB normal WOB on RA Abdomen: Soft, nondistended, mildly tender to palpation around the site of mesh placement.  Bowel sounds normal Extremities: No significant edema Neuro: Awake, alert, no facial droop, no gross focal deficit  Laboratory: Most recent CBC Lab Results  Component Value Date   WBC 11.8 (H) 03/06/2023   HGB 14.3 03/06/2023   HCT 42.0 03/06/2023   MCV 102.0 (H) 03/06/2023   PLT 384 03/06/2023   Most recent BMP    Latest Ref Rng & Units 03/06/2023    7:44 PM  BMP  Sodium 135 - 145 mmol/L 139   Potassium 3.5 - 5.1 mmol/L 4.7     Vonna Drafts, MD 03/08/2023, 12:39 AM  PGY-2, [facility] FPTS Intern pager: [phone], text pages welcome Secure chat group [facility] Teaching Service

## 2023-03-08 NOTE — Plan of Care (Signed)

## 2023-03-09 DIAGNOSIS — Z1152 Encounter for screening for COVID-19: Secondary | ICD-10-CM | POA: Diagnosis not present

## 2023-03-09 DIAGNOSIS — F10920 Alcohol use, unspecified with intoxication, uncomplicated: Secondary | ICD-10-CM | POA: Diagnosis not present

## 2023-03-09 DIAGNOSIS — J9601 Acute respiratory failure with hypoxia: Secondary | ICD-10-CM | POA: Diagnosis not present

## 2023-03-09 DIAGNOSIS — B079 Viral wart, unspecified: Secondary | ICD-10-CM | POA: Diagnosis not present

## 2023-03-09 LAB — BASIC METABOLIC PANEL
Anion gap: 5 (ref 5–15)
BUN: 10 mg/dL (ref 8–23)
CO2: 28 mmol/L (ref 22–32)
Calcium: 8.6 mg/dL — ABNORMAL LOW (ref 8.9–10.3)
Chloride: 104 mmol/L (ref 98–111)
Creatinine, Ser: 1.05 mg/dL (ref 0.61–1.24)
GFR, Estimated: >60 mL/min (ref >60)
Glucose, Bld: 97 mg/dL (ref 70–99)
Potassium: 3.6 mmol/L (ref 3.5–5.1)
Sodium: 137 mmol/L (ref 135–145)

## 2023-03-09 LAB — CBC
HCT: 44.8 % (ref 39.0–52.0)
Hemoglobin: 15.3 g/dL (ref 13.0–17.0)
MCH: 34.7 pg — ABNORMAL HIGH (ref 26.0–34.0)
MCHC: 34.2 g/dL (ref 30.0–36.0)
MCV: 101.6 fL — ABNORMAL HIGH (ref 80.0–100.0)
Platelets: 258 10*3/uL (ref 150–400)
RBC: 4.41 MIL/uL (ref 4.22–5.81)
RDW: 12.2 % (ref 11.5–15.5)
WBC: 7 10*3/uL (ref 4.0–10.5)
nRBC: 0 % (ref 0.0–0.2)

## 2023-03-09 MED ORDER — AMITRIPTYLINE HCL 10 MG PO TABS: 10.0000 mg | ORAL_TABLET | Freq: Every day | ORAL | 0 refills | Status: DC

## 2023-03-09 MED ORDER — VITAMIN B-1 100 MG PO TABS: 100.0000 mg | ORAL_TABLET | Freq: Every day | ORAL | 0 refills | Status: DC

## 2023-03-09 MED ORDER — GABAPENTIN 300 MG PO CAPS: 600.0000 mg | ORAL_CAPSULE | Freq: Two times a day (BID) | ORAL | 0 refills | Status: DC

## 2023-03-09 MED ORDER — CHLORDIAZEPOXIDE HCL 25 MG PO CAPS: ORAL_CAPSULE | ORAL | 0 refills | Status: AC

## 2023-03-09 MED ORDER — FOLIC ACID 1 MG PO TABS: 1.0000 mg | ORAL_TABLET | Freq: Every day | ORAL | 0 refills | Status: DC

## 2023-03-09 NOTE — Discharge Summary (Addendum)
Family Medicine Teaching [facility] Discharge Summary  Patient name: Carlos Montoya Medical record number: [id number] Date of birth: [birth year] Age: 62 y.o. Gender: male Date of Admission: 03/06/2023  Date of Discharge: 03/09/23 Admitting Physician: Nelia Shi, MD  Primary Care Provider: Elberta Fortis, MD Consultants: None  Indication for Hospitalization: Alcohol intoxication with hypoxia  Discharge Diagnoses/Problem List:  Principal Problem for Admission: Acute hypoxic respiratory failure  Other Problems addressed during stay:  Principal Problem:   Acute hypoxic respiratory failure (HCC) Active Problems:   Alcoholic intoxication without complication [facility])   [place]  Brief Hospital Course:  Carlos Montoya is a 62 y.o. male presenting with altered mental status in the setting of alcohol intoxication, but also found to be persistently hypoxic and was admitted to the [facility] Medicine Teaching service for acute respiratory failure possibly 2/2 alcohol intoxication vs aspiration pneumonitis.    Hypoxia In the ED, patient was hypoxic in the 80s and was placed on 4 L O2.  Patient seemed to have respiratory decompensation in the setting of severe alcohol intoxication.  There were no other findings notable as the cause of the patient's hypoxia during his stay.  ABG showed pH of 7.2 on with CO2 of 50, but VBG improved after hydration with pH 7.33.  Patient continued to desat without supplemental O2 and was admitted overnight.  Patient improved and was able to wean off of O2 during the course of his stay. He was able to ambulate with RN unit while maintaining good oxygenation on room air.  Alcoholic intoxication Patient presented with EtOH of 288 with anion gap (24) acidosis.  Patient reported he had been drinking "8-9 beers a day and taking some pain pills" to control his back pain.  UDS only positive for THC.  Placed patient on CIWA precautions, monitored >48hrs, and  scores were <2 prior to discharge. Patient started on librium taper given his hx of withdrawal seizures and was sent home to complete the taper.   Other chronic conditions were medically managed with home medications and formulary alternatives as necessary (anxiety, depression, BPD, chronic pain, GERD, ?COPD).  PCP Follow-up Recommendations: Discuss alcohol cessation, patient somewhat motivated to quit but endorses self-medicating due to uncontrolled back pain Monitor macrocytosis in setting of alcohol use  May benefit from pain management or PMR for back pain Ensure completed librium  Unsure why patient is on ASA? Look into this Lowered amitriptyline dose to 10 mg, please assess need to increase or taper completely    Please discuss with him if he wants to see a different GI or general surgeon, patient reported dissatisfaction with current specialists  Check EKG in clinic follow up QT prolongation   Disposition: To home  Discharge Condition: Improved   Discharge Exam:  Vitals:   03/09/23 0402 03/09/23 0814  BP: 108/74 124/82  Pulse: 91 99  Resp: 18 18  Temp: 98.1 F (36.7 C) 98 F (36.7 C)  SpO2: 98% 97%   General: Well-appearing. Resting comfortably in room. CV: Normal S1/S2. No extra heart sounds. Warm and well-perfused. Pulm: Breathing comfortably on room air. No increased WOB. Abd: Soft, non-distended. Mild tenderness to palpation of umbilical area, no erythema or induration.  Skin:  Warm, dry. Psych: Pleasant and appropriate.   Significant Labs and Imaging:  Recent Labs  Lab 03/09/23 0243  WBC 7.0  HGB 15.3  HCT 44.8  PLT 258   Recent Labs  Lab 03/09/23 0243  NA 137  K 3.6  CL 104  CO2 28  GLUCOSE 97  BUN 10  CREATININE 1.05  CALCIUM 8.6*    Pertinent Imaging from 8/30: CTPE: No evidence acute pulmonary embolism. Bibasilar atelectasis. CTCervical: Normal alignment of the cervical spine. No acute displaced fractures identified. CTHead: No acute  intracranial abnormalities. CXR: No focal airspace opacity  Results/Tests Pending at Time of Discharge: none  Discharge Medications:  Allergies as of 03/09/2023       Reactions   Prednisone Hives   Diphenhydramine Hcl Other (See Comments)   Has opposite effect   Tylenol [acetaminophen] Other (See Comments)   Told to avoid due to having hepatitis C previously        Medication List     STOP taking these medications    aspirin EC 81 MG tablet   gabapentin 600 MG tablet Commonly known as: Neurontin Replaced by: gabapentin 300 MG capsule       TAKE these medications    amitriptyline 10 MG tablet Commonly known as: ELAVIL Take 1 tablet (10 mg total) by mouth at bedtime. What changed:  medication strength how much to take   baclofen 10 MG tablet Commonly known as: LIORESAL Take 1 tablet (10 mg total) by mouth 3 (three) times daily as needed for muscle spasms.   chlordiazePOXIDE 25 MG capsule Commonly known as: LIBRIUM Take 1 capsule (25 mg total) by mouth 2 (two) times daily for 1 day, THEN 1 capsule (25 mg total) daily for 1 day. Start taking on: March 09, 2023   folic acid 1 MG tablet Commonly known as: FOLVITE Take 1 tablet (1 mg total) by mouth daily. Start taking on: March 10, 2023   gabapentin 300 MG capsule Commonly known as: NEURONTIN Take 2 capsules (600 mg total) by mouth 2 (two) times daily. Replaces: gabapentin 600 MG tablet   lamoTRIgine 100 MG tablet Commonly known as: LaMICtal Take 1 tablet (100 mg total) by mouth daily.   omeprazole 20 MG capsule Commonly known as: PRILOSEC TAKE 1 CAPSULE BY MOUTH EVERY DAY   prednisoLONE acetate 1 % ophthalmic suspension Commonly known as: PRED FORTE Place 1 drop into the right eye 4 (four) times daily.   thiamine 100 MG tablet Commonly known as: Vitamin B-1 Take 1 tablet (100 mg total) by mouth daily. Start taking on: March 10, 2023   Ventolin HFA 108 (90 Base) MCG/ACT inhaler Generic  drug: albuterol TAKE 2 PUFFS BY MOUTH EVERY 6 HOURS AS NEEDED What changed: See the new instructions.        Discharge Instructions: Please refer to Patient Instructions section of EMR for full details.  Patient was counseled important signs and symptoms that should prompt return to medical care, changes in medications, dietary instructions, activity restrictions, and follow up appointments.   Follow-Up Appointments:  Follow-up Information     Elberta Fortis, MD. Schedule an appointment as soon as possible for a visit.   Specialty: Family Medicine Why: Please call the clinic and schedule a hospital follow up in the next 7-10 days. Contact information: [address] [place] [address] [phone]         [facility]. Call.   Specialty: Rehabilitation Why: Please call the clinic and follow up regarding continued OUtpatient therapy. Contact information: [address] [place] [address] [phone]                Lincoln Brigham, MD 03/09/2023, 4:28 PM PGY-2, [facility] Family Medicine

## 2023-03-09 NOTE — Discharge Instructions (Addendum)
Thank you so much for allowing [place] to be part of your care!    You were admitted for respiratory distress (difficulty breathing) and monitoring for alcohol withdrawal. You are now safely past the withdrawal period and your breathing has improved.   We will have you complete your Librium taper at home.  You will need to take 1 dose of 25 mg this afternoon and will need to take another dose on 9/3  After that you will have completed your Librium.  Additionally, it is important that you continue all of your home medications and vitamins.  POST-HOSPITAL & CARE INSTRUCTIONS Please follow-up with the providers noted below. Please let PCP/Specialists know of any changes that were made.  Please see medications section of this packet for any medication changes.   DOCTOR'S APPOINTMENT & FOLLOW UP CARE INSTRUCTIONS  Future Appointments  Date Time Provider Department Center  03/12/2023  4:00 PM Carlyn Reichert, MD [facility] None  03/13/2023  1:55 PM Elberta Fortis, MD [facility]  03/16/2023  2:45 PM Barbaraann Faster, PT [facility] None  03/30/2023  2:45 PM Barbaraann Faster, PT [facility] None    RETURN PRECAUTIONS: -Worsening shortness of breath - Confusion - Chest pain - Sweating, tremors, seizures  Take care and be well!  Family Medicine Teaching Service  [place]  [facility]  [address] [place], [place] [address] [phone]

## 2023-03-09 NOTE — Plan of Care (Signed)
RN performed walking O2 test with pt. Pt ambulated with RN in hallway on room air. Pt SpO2 Stayed 96% and above while  ambulating

## 2023-03-09 NOTE — Progress Notes (Signed)
No concerns today. He wants to go home today. No acute findings on his physical exam today.  A/P: AMS: Due to Alcohol intoxication Resolved He is back to his neurologic baseline Ongoing alcohol cessation counseling.  Acute hypoxic respiratory failure: Resolved Likely due to depressed respiratory drive in the setting of alcohol intoxication vs undiagnosed COPD No O2 requirement on RA and on exertion at the time of discharge Outpatient PFT eval recommended  ETOH dependent: Complete Librium taper Discuss Naltrexone with PCP

## 2023-03-09 NOTE — Progress Notes (Signed)
Transition of Care [facility]) - Inpatient Brief Assessment   Patient Details  Name: Carlos Montoya MRN: [id number] Date of Birth: Nov 08, 1960  Transition of Care [employer]) CM/SW Contact:    Janae Bridgeman, RN Phone Number: 03/09/2023, 10:35 AM   Clinical Narrative: Patient admitted for Acute hypoxic respiratory failure and ETOH withdrawal.  The patient is likely discharging today.  I spoke with the patient and he requests assistance with transportation to home since he does not have available ride home.  Taxi voucher will be provided and bedside nursing to call [facility] taxi when patient is ready for discharge.  Patient with history of ETOH abuse and OP Substance abuse counseling provided at the bedside along with resources.  New orders to continue currently therapy at [facility] OUtpatient therapy - patient verbalized that he is active with [facility] location.  I asked attending MD to co-sign the order.  No other TOC needs at this time and patient will discharge home today - via taxi - bedside nursing provided voucher.   Transition of Care Asessment: Insurance and Status: (P) Insurance coverage has been reviewed Patient has primary care physician: (P) Yes Home environment has been reviewed: (P) Apartment Prior level of function:: (P) Independent - currently active with OUtpatient therapy services at [facility]patient Rehabilitation at [facility] location Prior/Current Home Services: (P) No current home services Social Determinants of Health Reivew: (P) SDOH reviewed interventions complete Readmission risk has been reviewed: (P) Yes Transition of care needs: (P) transition of care needs identified, TOC will continue to follow

## 2023-03-12 ENCOUNTER — Ambulatory Visit (INDEPENDENT_AMBULATORY_CARE_PROVIDER_SITE_OTHER): Payer: Medicaid Other | Admitting: Student

## 2023-03-12 VITALS — BP 132/84 | Wt 145.0 lb

## 2023-03-12 DIAGNOSIS — F1090 Alcohol use, unspecified, uncomplicated: Secondary | ICD-10-CM | POA: Diagnosis not present

## 2023-03-12 DIAGNOSIS — F3132 Bipolar disorder, current episode depressed, moderate: Secondary | ICD-10-CM | POA: Diagnosis not present

## 2023-03-12 MED ORDER — NALTREXONE HCL 50 MG PO TABS: 50.0000 mg | ORAL_TABLET | Freq: Every day | ORAL | 1 refills | Status: DC

## 2023-03-12 MED ORDER — LAMOTRIGINE 100 MG PO TABS
100.0000 mg | ORAL_TABLET | Freq: Every day | ORAL | 2 refills | Status: DC
Start: 2023-03-12 — End: 2024-04-05

## 2023-03-13 ENCOUNTER — Ambulatory Visit: Payer: Medicaid Other | Admitting: Family Medicine

## 2023-03-13 NOTE — Progress Notes (Signed)
BH MD Outpatient Progress Note  03/13/2023 3:17 PM Carlos Montoya  MRN:  [id number]  Assessment:  Carlos Montoya presents for follow-up evaluation in-person. Today, we discussed the patient's recent hospitalization for respiratory depression, likely secondary to alcohol intoxication and possible pneumonia.  The patient did not report previous significant alcohol use but now says that he has been drinking 8-9 beers per day for some time.  The patient declined referral to the CD IOP program.  However, he was interested in starting naltrexone.  The patient only recently got discharged from the hospital, but he states that he completed his Librium taper and has stayed away from alcohol.  He states that he intends to stay sober from alcohol.  The patient does report using marijuana a few times per week because he feels that his pain is overwhelming.  We discussed starting naltrexone.  We discussed the need for regular LFTs as well as concerns about pain control should he be emergently admitted medically.  He states that he has not used opioids over the past several weeks, and he was counseled on possible precipitated opioid withdrawal with the starting naltrexone. (His urine drug screen was negative for opioids).  The patient reports continued benefit from lamotrigine.  We will continue this medication.  Prolonged QT interval was noted at the hospital.  On each of these EKGs the patient has a right bundle branch block, which is known to artificially prolong the QT interval. The only published correction that I can find is by Evalina Field al. (Annals of noninvasive electrocardiology 2023). By their correction his actual corrected QT interval is normal with even the most severely "abnormal" EKG (collected on 9/3, with a Qtc of 525 msec).  We are not prescribing him any QT prolonging medications presently but it seems to be an important issue to address.   Identifying Information: Carlos Montoya is a 62 y.o. y.o. male with a history of major depressive disorder and generalized anxiety disorder, as well as a medical history of hepatitis C s/p treatment, and umbilical hernia s/p recent repair who is an established patient with [facility] for management of depression, anxiety, and insomnia.  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 - Continue lamotrigine 100 mg daily for irritability and possible bipolar depression -The patient denies recent rash  #PTSD  nightmares  insomnia Tried prazosin but the patient developed dizziness and the medication was not effective  # Generalized Anxiety Disorder Past medication trials: As above Status of problem: New problem for this provider Interventions: As above -Patient continues to decline therapy  Patient was given contact information for behavioral health clinic and was instructed to call 911 for emergencies.   Subjective:  Chief Complaint:  Chief Complaint  Patient presents with   Follow-up    Interval History:  Today the patient reports that severe pain is what has been prompting his drinking.  He was following with a pain management doctor but has been frustrated by her recommendations.  He reports successfully completing his Librium taper and says that he has not  had any alcohol.  He says that he has not had any cravings for the substance yet.    He reports continued nightmares of "people grabbing my shoulders".  He states that his mood is predominantly depressed.  He states that "I keep going because  of my faith".  He denies experiencing any suicidal thoughts.  It is notable that the patient exhibits a linear and logical thought process throughout the interview but at times talks about aliens.   Visit Diagnosis:    ICD-10-CM   1. Alcohol use disorder  F10.90 naltrexone (DEPADE) 50 MG tablet    2. Bipolar affective disorder, currently depressed, moderate (HCC)  F31.32 lamoTRIgine (LAMICTAL) 100 MG tablet         Past Psychiatric History: as above  Past Medical History:  Past Medical History:  Diagnosis Date   Acid reflux    Alcohol use disorder, mild, abuse    Anxiety    Bipolar disorder (HCC)    COPD (chronic obstructive pulmonary disease) (HCC)    Depression    History of hepatitis C 2016   treated   Opioid dependence (HCC)    Pneumonia    Umbilical hernia     Past Surgical History:  Procedure Laterality Date   COLONOSCOPY     HERNIA REPAIR     TONSILLECTOMY     UMBILICAL HERNIA REPAIR N/A 03/14/2022   Procedure: LAPAROSCOPIC ASSISTED REPAIR UMBILICAL HERNIA WITH MESH;  Surgeon: [name], MD;  Location: [facility];  Service: General;  Laterality: N/A;    Family Psychiatric History: Per initial note  Family History:  Family History  Problem Relation Age of Onset   Diabetes Mother    Heart disease Father    Colon cancer Father     Social History:  Social History   Socioeconomic History   Marital status: Single    Spouse name: Not on file   Number of children: Not on file   Years of education: Not on file   Highest education level: Not on file  Occupational History   Not on file  Tobacco Use   Smoking status: Every Day    Current packs/day: 0.50    Types: Cigarettes   Smokeless tobacco: Never  Vaping Use   Vaping status: Never Used  Substance and Sexual Activity   Alcohol use: Yes    Comment: 1 beer today   Drug use: No   Sexual activity: Not on file  Other Topics Concern   Not on file  Social History Narrative   Not on file   Social  Determinants of Health   Financial Resource Strain: Not on file  Food Insecurity: No Food Insecurity (03/07/2023)   Hunger Vital Sign    Worried About Running Out of Food in the Last Year: Never true    Ran Out of Food in the Last Year: Never true  Transportation Needs: No Transportation Needs (03/07/2023)   PRAPARE - Administrator, Civil Service (Medical): No    Lack of Transportation (Non-Medical): No  Physical Activity: Not on file  Stress: Not on file  Social Connections: Not on file    Allergies:  Allergies  Allergen Reactions   Prednisone Hives   Diphenhydramine Hcl Other (See Comments)    Has opposite effect   Tylenol [Acetaminophen] Other (See Comments)    Told to avoid due to having hepatitis C previously    Current Medications: Current Outpatient Medications  Medication Sig Dispense Refill   naltrexone (DEPADE)  50 MG tablet Take 1 tablet (50 mg total) by mouth daily. 30 tablet 1   albuterol (VENTOLIN HFA) 108 (90 Base) MCG/ACT inhaler TAKE 2 PUFFS BY MOUTH EVERY 6 HOURS AS NEEDED (Patient taking differently: Inhale 2 puffs into the lungs every 6 (six) hours as needed for wheezing or shortness of breath.) 18 each 1   amitriptyline (ELAVIL) 10 MG tablet Take 1 tablet (10 mg total) by mouth at bedtime. 30 tablet 0   baclofen (LIORESAL) 10 MG tablet Take 1 tablet (10 mg total) by mouth 3 (three) times daily as needed for muscle spasms. 90 each 5   folic acid (FOLVITE) 1 MG tablet Take 1 tablet (1 mg total) by mouth daily. 30 tablet 0   gabapentin (NEURONTIN) 300 MG capsule Take 2 capsules (600 mg total) by mouth 2 (two) times daily. 30 capsule 0   lamoTRIgine (LAMICTAL) 100 MG tablet Take 1 tablet (100 mg total) by mouth daily. 30 tablet 2   omeprazole (PRILOSEC) 20 MG capsule TAKE 1 CAPSULE BY MOUTH EVERY DAY 90 capsule 1   prednisoLONE acetate (PRED FORTE) 1 % ophthalmic suspension Place 1 drop into the right eye 4 (four) times daily.     thiamine (VITAMIN B-1)  100 MG tablet Take 1 tablet (100 mg total) by mouth daily. 30 tablet 0   No current facility-administered medications for this visit.    Psychiatric Specialty Exam: Review of Systems  Respiratory:  Negative for shortness of breath.   Cardiovascular:  Negative for chest pain.  Gastrointestinal:  Negative for constipation, diarrhea, nausea and vomiting. Positive for mild ABD pain.  Neurological:  Negative for headaches.       BP 132/84   Wt 145 lb (65.8 kg)   BMI 20.22 kg/m    General Appearance: Fairly Groomed  Eye Contact:  Good  Speech:  Clear and Coherent  Volume:  Normal  Mood:  depressed, irritable  Affect:  Congruent  Thought Process:  Coherent  Orientation:  Full (Time, Place, and Person)  Thought Content: Logical   Suicidal Thoughts:  No  Homicidal Thoughts:  No  Memory:  Immediate;   Good  Judgement:  fair  Insight: fair  Psychomotor Activity:  Normal  Concentration:  Concentration: Good  Recall:  Good  Fund of Knowledge: Good  Language: Good  Akathisia:  No  Handed:    AIMS (if indicated): not done  Assets:  Communication Skills Desire for Improvement Financial Resources/Insurance Housing Leisure Time Physical Health  ADL's:  Intact  Cognition: WNL  Sleep:  Fair    PE: General: ill-appearing; no acute distress  Pulm: no increased work of breathing on room air  Strength & Muscle Tone: within normal limits Neuro: no focal neurological deficits observed  Gait & Station: normal  Metabolic Disorder Labs: No results found for: "HGBA1C", "MPG" No results found for: "PROLACTIN" Lab Results  Component Value Date   CHOL 138 11/06/2021   TRIG 105 11/06/2021   HDL 52 11/06/2021   CHOLHDL 2.7 11/06/2021   LDLCALC 67 11/06/2021   LDLCALC 114 (H) 09/13/2020   Lab Results  Component Value Date   TSH 1.410 09/13/2020    Therapeutic Level Labs: No results found for: "LITHIUM" No results found for: "VALPROATE" No results found for:  "CBMZ"  Screenings: PHQ2-9    Flowsheet Row Office Visit from 12/17/2022 in [facility] Physical Medicine & Rehabilitation Office Visit from 09/03/2022 in [facility] Physical Medicine & Rehabilitation Office Visit from 08/06/2022 in [facility]  Physical Medicine & Rehabilitation Office Visit from 06/09/2022 in [facility] Office Visit from 02/27/2022 in [facility]  PHQ-2 Total Score 6 0 6 6 1   PHQ-9 Total Score -- -- 18 20 --      Flowsheet Row ED to Hosp-Admission (Discharged) from 03/06/2023 in [facility] ED from 01/20/2023 in [facility] Emergency Department at [facility] ED from 09/02/2022 in [facility] Emergency Department at [facility]  C-SSRS RISK CATEGORY No Risk No Risk No Risk       Collaboration of Care: Collaboration of Care: Other none necessary  Patient/Guardian was advised Release of Information must be obtained prior to any record release in order to collaborate their care with an outside provider. Patient/Guardian was advised if they have not already done so to contact the registration department to sign all necessary forms in order for [place] to release information regarding their care.   Consent: Patient/Guardian gives verbal consent for treatment and assignment of benefits for services provided during this visit. Patient/Guardian expressed understanding and agreed to proceed.   A total of 30 minutes was spent involved in face to face clinical care, chart review, documentation.   Carlyn Reichert, MD 03/13/2023, 3:17 PM

## 2023-03-13 NOTE — Progress Notes (Deleted)
    SUBJECTIVE:   CHIEF COMPLAINT / HPI:   Hospital follow up Admitted from 8/30 to 9/2 for acute hypoxic respiratory failure in the setting of alcohol use disorder. Discharged on Librium taper for withdrawal.  PCP Follow-up Recommendations: Discuss alcohol cessation, patient somewhat motivated to quit but endorses self-medicating due to uncontrolled back pain Monitor macrocytosis in setting of alcohol use  May benefit from pain management for back pain Ensure completed librium  Unsure why patient is on ASA? Look into this Lowered amitriptyline dose to 10 mg, please assess need to increase or taper completely    Please discuss with him if he wants to see a different GI or general surgeon, patient reported dissatisfaction with current specialists  Check EKG in clinic follow up QT prolongation   Back pain Sees Pain Management. On Elavil and Baclofen. Started PT. Uses THC for adjunct pain control.  PERTINENT  PMH / PSH: ***  OBJECTIVE:   There were no vitals taken for this visit. ***  General: NAD, pleasant, able to participate in exam Cardiac: RRR, no murmurs. Respiratory: CTAB, normal effort, No wheezes, rales or rhonchi Abdomen: Bowel sounds present, nontender, nondistended Extremities: no edema or cyanosis. Skin: warm and dry, no rashes noted Neuro: alert, no obvious focal deficits Psych: Normal affect and mood  ASSESSMENT/PLAN:   No problem-specific Assessment & Plan notes found for this encounter.     Dr. Elberta Fortis, DO [facility]    {    This will disappear when note is signed, click to select method of visit    :1}

## 2023-03-16 ENCOUNTER — Ambulatory Visit: Payer: Medicaid Other | Admitting: Physical Therapy

## 2023-03-20 ENCOUNTER — Encounter: Payer: Self-pay | Admitting: Family Medicine

## 2023-03-20 ENCOUNTER — Ambulatory Visit (INDEPENDENT_AMBULATORY_CARE_PROVIDER_SITE_OTHER): Payer: Medicaid Other | Admitting: Family Medicine

## 2023-03-20 ENCOUNTER — Other Ambulatory Visit: Payer: Self-pay

## 2023-03-20 ENCOUNTER — Other Ambulatory Visit: Payer: Self-pay | Admitting: Family Medicine

## 2023-03-20 VITALS — BP 114/83 | HR 90 | Ht 71.0 in | Wt 145.4 lb

## 2023-03-20 DIAGNOSIS — Z0000 Encounter for general adult medical examination without abnormal findings: Secondary | ICD-10-CM | POA: Diagnosis not present

## 2023-03-20 DIAGNOSIS — R9431 Abnormal electrocardiogram [ECG] [EKG]: Secondary | ICD-10-CM | POA: Insufficient documentation

## 2023-03-20 DIAGNOSIS — L989 Disorder of the skin and subcutaneous tissue, unspecified: Secondary | ICD-10-CM | POA: Diagnosis not present

## 2023-03-20 DIAGNOSIS — F1090 Alcohol use, unspecified, uncomplicated: Secondary | ICD-10-CM

## 2023-03-20 DIAGNOSIS — M5136 Other intervertebral disc degeneration, lumbar region: Secondary | ICD-10-CM

## 2023-03-20 DIAGNOSIS — F1020 Alcohol dependence, uncomplicated: Secondary | ICD-10-CM

## 2023-03-20 DIAGNOSIS — Z720 Tobacco use: Secondary | ICD-10-CM | POA: Diagnosis not present

## 2023-03-20 DIAGNOSIS — Z23 Encounter for immunization: Secondary | ICD-10-CM | POA: Diagnosis not present

## 2023-03-20 DIAGNOSIS — F17200 Nicotine dependence, unspecified, uncomplicated: Secondary | ICD-10-CM

## 2023-03-20 MED ORDER — ALBUTEROL SULFATE HFA 108 (90 BASE) MCG/ACT IN AERS: 1.0000 | INHALATION_SPRAY | RESPIRATORY_TRACT | 1 refills | Status: DC | PRN

## 2023-03-20 MED ORDER — FOLIC ACID 1 MG PO TABS
1.0000 mg | ORAL_TABLET | Freq: Every day | ORAL | 0 refills | Status: DC
Start: 2023-03-20 — End: 2024-04-05

## 2023-03-20 MED ORDER — NALTREXONE HCL 50 MG PO TABS
50.0000 mg | ORAL_TABLET | Freq: Every day | ORAL | 1 refills | Status: DC
Start: 2023-03-20 — End: 2024-01-12

## 2023-03-20 NOTE — Assessment & Plan Note (Signed)
Precontemplative, declines additional support

## 2023-03-20 NOTE — Assessment & Plan Note (Signed)
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  Present in left glutes near upper gluteal cleft, possible concern for friction at removal site. -Messaged dermatology clinic to see if patient would be a candidate for removal in office

## 2023-03-20 NOTE — Assessment & Plan Note (Signed)
Chronic pain, ongoing. Seeing Dr. Shearon Stalls with PM&R for management and attending physical therapy.  Requested increased ibuprofen 800 mg, discussed GI and renal risk and patient opted to discuss further with pain management doctor

## 2023-03-20 NOTE — Assessment & Plan Note (Signed)
Abstaining from alcohol use, reports taking naltrexone 50 mg daily in addition to folate and thiamine.  Seeing Dr. Vicente Serene with behavioral health for supportive management. -Refill naltrexone and folic acid

## 2023-03-20 NOTE — Assessment & Plan Note (Signed)
Scheduled for discussion about colonoscopy with [facility] on 04/09/2023

## 2023-03-20 NOTE — Progress Notes (Signed)
    SUBJECTIVE:   CHIEF COMPLAINT / HPI:   Chronic pain Reports he has chronic pain in his back and right leg that limit his ADLs.  States he sees Dr. Shearon Stalls for pain management and is attending physical therapy. States any bending significantly worsens pain.  Growth on L glute Reports that has been present for around 2 month. Reports it was initially there then went away and now has come back. Initially grew in size but has not grown in the past month. Not itchy or painful. May crusty at times.  Alcohol use disorder States he is abstained from alcohol since his hospitalization on 8/30 to 9/2.  Completed Librium taper. Has been taking naltrexone, folate, thiamine daily. Seeing Dr. Vicente Serene with behavioral health for continued management.  Feels his alcohol use was related to stress and anxiety which have since improved.  Colonoscopy -scheduled with [facility]/09/2023 to discuss  Tobacco use Does not smoke for days at time and then will smoke up to half pack per day. Has everything to quit and declines additional support  PERTINENT  PMH / PSH: Chronic pain, alcohol use disorder, tobacco use, anxiety, depression, BPD, GERD  OBJECTIVE:   BP 114/83   Pulse 90   Ht 5\' 11"  (1.803 m)   Wt 145 lb 6.4 oz (66 kg)   SpO2 97%   BMI 20.28 kg/m    General: Alert, no apparent distress, well groomed HEENT: Normocephalic, atraumatic, moist mucus membranes, neck supple Respiratory: Normal respiratory effort GI: Non-distended Skin: Approximately 1 cm well-circumscribed, raised, fleshy colored skin lesion on the left glute near upper gluteal cleft.  Nontender to palpation.  Some scaling noted. Neuro: Ambulates with cane Psych: Appropriate mood and affect  ASSESSMENT/PLAN:   Alcohol use disorder, severe, dependence (HCC) Abstaining from alcohol use, reports taking naltrexone 50 mg daily in addition to folate and thiamine.  Seeing Dr. Vicente Serene with behavioral health for supportive  management. -Refill naltrexone and folic acid  Tobacco use disorder Precontemplative, declines additional support  Degenerative disc disease, lumbar Chronic pain, ongoing. Seeing Dr. Shearon Stalls with PM&R for management and attending physical therapy.  Requested increased ibuprofen 800 mg, discussed GI and renal risk and patient opted to discuss further with pain management doctor  Healthcare maintenance Scheduled for discussion about colonoscopy with [facility] on 04/09/2023  Skin lesion Most consistent with AK vs nevus, patient would like it removed.  Present in left glutes near upper gluteal cleft, possible concern for friction at removal site. -Messaged dermatology clinic to see if patient would be a candidate for removal in office  QT prolongation Prolonged QT during hospital stay, recommended repeat EKG to assess for resolution today. Patient adamantly declined.   *Receive flu shot and Prevnar 20 today.  Dr. Elberta Fortis, DO [facility]

## 2023-03-20 NOTE — Patient Instructions (Signed)
It was wonderful to see you today! Thank you for choosing [facility].   Please bring ALL of your medications with you to every visit.   Today we talked about:  We gave you the flu shot and pneumonia shot today.  Your medication refills for naltrexone, folic acid and albuterol were sent to your pharmacy For the spot on her gluteal cheek, we have rescheduled for follow-up to remove it.  Please keep in mind the spot might be uncomfortable after removal and plan accordingly. Please continue to follow-up with your specialist as needed for pain management, general surgery and behavioral health.  Please let me know if you have any other concerns.  Please follow up in 1 month  If you haven't already, sign up for My Chart to have easy access to your labs results, and communication with your primary care physician.  Call the clinic at [phone] if your symptoms worsen or you have any concerns.  Please be sure to schedule follow up at the front desk before you leave today.   Elberta Fortis, DO Family Medicine

## 2023-03-20 NOTE — Assessment & Plan Note (Signed)
Prolonged QT during hospital stay, recommended repeat EKG to assess for resolution today. Patient adamantly declined.

## 2023-03-30 ENCOUNTER — Ambulatory Visit: Payer: Medicaid Other | Attending: Physical Medicine and Rehabilitation | Admitting: Physical Therapy

## 2023-03-30 DIAGNOSIS — R279 Unspecified lack of coordination: Secondary | ICD-10-CM | POA: Diagnosis present

## 2023-03-30 DIAGNOSIS — R293 Abnormal posture: Secondary | ICD-10-CM | POA: Diagnosis present

## 2023-03-30 DIAGNOSIS — M6281 Muscle weakness (generalized): Secondary | ICD-10-CM

## 2023-03-30 NOTE — Therapy (Signed)
OUTPATIENT PHYSICAL THERAPY MALE PELVIC EVALUATION   Patient Name: Creighton Wix MRN: [id number] DOB:09-Mar-1961, 62 y.o., male Today's Date: 03/30/2023  END OF SESSION:  PT End of Session - 03/30/23 1449     Visit Number 2    Number of Visits 13    Date for PT Re-Evaluation 04/07/23    Authorization Type [employer]    Authorization Time Period 02/24/23 to 04/07/23    PT Start Time 1446    PT Stop Time 1525    PT Time Calculation (min) 39 min    Activity Tolerance Patient tolerated treatment well    Behavior During Therapy WFL for tasks assessed/performed             Past Medical History:  Diagnosis Date   Acid reflux    Alcohol use disorder, mild, abuse    Anxiety    Bipolar disorder (HCC)    COPD (chronic obstructive pulmonary disease) (HCC)    Depression    History of hepatitis C 2016   treated   Opioid dependence (HCC)    Pneumonia    Umbilical hernia    Past Surgical History:  Procedure Laterality Date   COLONOSCOPY     HERNIA REPAIR     TONSILLECTOMY     UMBILICAL HERNIA REPAIR N/A 03/14/2022   Procedure: LAPAROSCOPIC ASSISTED REPAIR UMBILICAL HERNIA WITH MESH;  Surgeon: [name], MD;  Location: [facility];  Service: General;  Laterality: N/A;   Patient Active Problem List   Diagnosis Date Noted   QT prolongation 03/20/2023   Skin lesion 03/07/2023   Bilateral hip pain 08/06/2022   Bipolar 1 disorder (HCC) 08/06/2022   Leg cramping 06/09/2022   H/O hernia repair 03/15/2022   GAD (generalized anxiety disorder) 02/28/2022   Asthma 03/03/2019   Weight loss 11/30/2018   Chronic left shoulder pain 04/26/2018   Seasonal allergies 12/18/2017   Healthcare maintenance 09/10/2017   Chronic pain syndrome 09/10/2017   Moderate episode of recurrent major depressive disorder (HCC) 09/10/2017   Alcohol use disorder, severe, dependence (HCC) 07/13/2017   GERD (gastroesophageal reflux disease) 03/11/2017   Umbilical hernia 03/11/2017   Degenerative  disc disease, lumbar 07/17/2014   Tobacco use disorder 07/17/2014   Hepatitis C 07/17/2014    PCP: Sabino Dick, DO  REFERRING PROVIDER: [name], MD  REFERRING DIAG: K42.9 (ICD-10-CM) - Umbilical hernia without obstruction or gangrene  THERAPY DIAG:  Muscle weakness (generalized)  Abnormal posture  Unspecified lack of coordination  Rationale for Evaluation and Treatment: Rehabilitation  ONSET DATE: sept 8, 2023  SUBJECTIVE:  SUBJECTIVE STATEMENT: Pt reports he does continue to have bil leg pain worse in Rt, no new falls. Has been doing HEP since eval but was limited due to transportation to appointments since then. Has not seen a difference in pain at abdomen. Still struggling with constipation which does make pain worse, is straining to empty stool and sometimes urine. Has been using stool with bowel movements and this has helped when needing it  per pt. And reports feeling more discomfort on right mid abdomen and states he thinks he "can feel my sutures" at belly button.    PAIN:  Are you having pain? Yes NPRS scale: 1-2/10 at time of appt Pain location:  center of abdomen  Pain type: sharp and tight Pain description: intermittent   Aggravating factors: twisting, laying on stomach, constipation Relieving factors: changing position  PRECAUTIONS: Other: recent hernia repair  WEIGHT BEARING RESTRICTIONS: No  FALLS:  Has patient fallen in last 6 months? Yes. Number of falls 3  LIVING ENVIRONMENT: Lives with: lives alone Lives in: House/apartment    PLOF: Requires assistive device for independence - SPC  PATIENT GOALS: to have less pain   PERTINENT HISTORY:  Acid reflux, Alcohol use disorder, mild, abuse, Anxiety, Bipolar disorder (HCC), COPD (chronic obstructive  pulmonary disease) (HCC), Depression, History of hepatitis C (2016), Opioid dependence (HCC), Pneumonia, and Umbilical hernia.   BOWEL MOVEMENT: Pain with bowel movement: No Type of bowel movement:Type (Bristol Stool Scale) 2, Frequency every 3 days, and Strain Yes Fully empty rectum: No Leakage: No Pads: No Fiber supplement: Yes: stool softeners, milk of magnesia   URINATION: Pain with urination: No Fully empty bladder: yes  Stream: Strong Urgency: No Frequency: not quicker than every 2 hours Leakage:  none Pads: No  INTERCOURSE: Pain with intercourse:  not active Climax: not active Ejaculation: No   OBJECTIVE:   DIAGNOSTIC FINDINGS:    PATIENT SURVEYS:    PFIQ-7 100  COGNITION: Overall cognitive status: Within functional limits for tasks assessed     SENSATION: Light touch: Deficits reports numbness in bil feet contributing to falls Proprioception: Deficits reports numbness in bil feet contributing to falls  MUSCLE LENGTH: Bil hamstrings and adductors limited by 25%   FUNCTIONAL TESTS:  Functional  squat - unable to complete, worse pain and trunk shifting as well and use of hands to return to standing    GAIT: Distance walked: 150' Assistive device utilized: Single point cane Level of assistance: CGA however does live alone Comments: shuffled gait, trunk sway, decreased stride length and poor cadence  POSTURE: rounded shoulders, forward head, posterior pelvic tilt, and flexed trunk    LUMBARAROM/PROM: Pain at abdomen with all mobility  A/PROM A/PROM  eval  Flexion Limited by 75%  Extension WFL  Right lateral flexion Limited by 75%  Left lateral flexion Limited by 75%  Right rotation Limited by 75%  Left rotation Limited by 75%   (Blank rows = not tested)  LOWER EXTREMITY AROM/PROM:  [facility]  LOWER EXTREMITY MMT:  Hips grossly 3+/5, knees 4/5  PALPATION: GENERAL very sensitive to pain at umbilicus, pt denied PT palpation other than light  touch at belly button. Pt does have two small bumps at Lt side of belly button that he reports is a staple. Pt did not allow PT to touch this due to pain. Did allow PT to assess abdominal tissue approximately 4 inches on either side of umbilicus and this did have tension noted.  External Perineal Exam not indicated at this time              Internal Pelvic Floor not indicated at this time Patient confirms identification and approves PT to assess internal pelvic floor and treatment No  PELVIC MMT:   MMT eval  Internal Anal Sphincter   External Anal Sphincter   Puborectalis   Diastasis Recti   (Blank rows = not tested)  TONE: not indicated at this time  TODAY'S TREATMENT:                                                                                                                              DATE:   EVAL 12/31/2022 Examination completed, findings reviewed, pt educated on POC, voiding mechanics and abdominal massage handouts. Pt motivated to participate in PT and agreeable to attempt recommendations.    03/30/23:  Reviewed pt's symptoms and POC, reports he has had a hard time coming to appointments due to transportation but has been trying recommendations, feels like constipation is better but still needs to strain intermittently and does report this makes pain much worse at abdomen. PT educated pt on importance of breathing and not holding breath to decreased strain at abdomen, and use of step stool for bowel movement to improve pelvic floor relaxation and ease of voiding bowels. And to attempt balloon breathing to further assist in decreased strain at abdomen.  PT did assess abdomen with pt indicating pain sights and does have 4 small prongs protruding slightly forward and pt reports painful to touch. PT did message referring provider about this. Pt also consented to this. Pt states he hasn't seen a big improvement with pain since PT eval and asked PT about pain medication  however PT educated pt he would need to ask MD about this as PT is unable to provide any recommendation for pain medication. Pt agreed.  2x10 diaphragmatic breathing with TA activation needed max cues  2x10 sidelying diaphragmatic breathing with ball press to improve deep core activation X10 Sit to stand with emphasis on exhale with standing to prevent holding breath with standing  PATIENT EDUCATION:  Education details: voiding mechanics and abdominal massage handouts. Person educated: Patient Education method: Explanation, Demonstration, Tactile cues, Verbal cues, and Handouts Education comprehension: verbalized understanding  HOME EXERCISE PROGRAM: [id number]  ASSESSMENT:  CLINICAL IMPRESSION: Patient states similar pain symptoms to eval, has had improvement with constipation symptoms but does need to strain sometimes. Pt session focused on education for decreased strain at abdomen with holding breath and beginning gentle core activation to decrease strain at abdomen. Pt denied pain increase during session and reports "today is an ok pain day." Pt would benefit from additional PT to further address deficits.    OBJECTIVE IMPAIRMENTS: decreased activity tolerance, decreased coordination, decreased endurance, decreased mobility, decreased strength, increased fascial restrictions, increased muscle spasms, impaired flexibility, improper body mechanics, postural dysfunction, and pain.   ACTIVITY LIMITATIONS: carrying, lifting, bending, standing, squatting,  sleeping, and locomotion level  PARTICIPATION LIMITATIONS: community activity  PERSONAL FACTORS: Time since onset of injury/illness/exacerbation and 1 comorbidity: medical history  are also affecting patient's functional outcome.   REHAB POTENTIAL: Fair    CLINICAL DECISION MAKING: Evolving/moderate complexity  EVALUATION COMPLEXITY: Moderate   GOALS: Goals reviewed with patient? Yes  SHORT TERM GOALS: Target date: 01/28/23  Pt to  be I with HEP.  Baseline: Goal status: INITIAL   LONG TERM GOALS: Target date: 04/02/23  Pt to be I with advanced HEP.  Baseline:  Goal status: INITIAL  2.  Pt to demonstrate at least 5/5 bil hip strength for improved pelvic stability and functional squats without leakage.  Baseline:  Goal status: INITIAL  3.  Pt to be I with relaxation techniques and breathing mechanics for decreased pain.  Baseline:  Goal status: INITIAL  4.  Pt to demonstrate full ROM of trunk in all directions for improved mobility with bed mobility and gait with less pain.  Baseline:  Goal status: INITIAL  5.  Pt to demonstrate 5xSTS in less than 14s for decreased fall risk Baseline:  Goal status: INITIAL  6.  Pt to report no more than 2/10 pain at abdomen due to improved pelvic stability, core and hip strength and improved mobility. Baseline:  Goal status: INITIAL   PLAN:  PT FREQUENCY: every other week  PT DURATION:  4 sessions  PLANNED INTERVENTIONS: Therapeutic exercises, Therapeutic activity, Neuromuscular re-education, Patient/Family education, Self Care, Joint mobilization, DME instructions, Aquatic Therapy, Dry Needling, Spinal mobilization, Cryotherapy, Moist heat, scar mobilization, Taping, Biofeedback, and Manual therapy  PLAN FOR NEXT SESSION: breathing mechanics, voiding mechanics, stretching at core/spine/hips, manual at abdomen/spine/hips, strengthening core and hips  Otelia Sergeant, PT, DPT 09/23/244:10 PM

## 2023-04-09 ENCOUNTER — Ambulatory Visit (INDEPENDENT_AMBULATORY_CARE_PROVIDER_SITE_OTHER): Payer: Medicaid Other | Admitting: Family Medicine

## 2023-04-09 VITALS — BP 125/80 | HR 100 | Temp 98.4°F | Wt 137.6 lb

## 2023-04-09 DIAGNOSIS — L989 Disorder of the skin and subcutaneous tissue, unspecified: Secondary | ICD-10-CM | POA: Diagnosis present

## 2023-04-09 NOTE — Assessment & Plan Note (Addendum)
Differentials include wart, BCC, dermatofibroma, skin tag, SK epidermoid cyst Recommended surgical removal via shave biopsy Informed consent obtained, see procedure note below:  Skin Biopsy Procedure Note PRE-OP DIAGNOSIS: skin lesion POST-OP DIAGNOSIS: Same  PROCEDURE: skin biopsy Performing Physician: Dr. Rexene Alberts Supervising Physician (if applicable): Dr. Lum Babe  PROCEDURE:  Shave Biopsy         The area surrounding the skin lesion was prepared and draped in the  usual sterile manner. The lesion was removed in the usual manner by the  biopsy method noted above. Hemostasis was assured. The patient tolerated  the procedure well.  Closure:     Drysol and pressure for hemostasis         Followup: The patient tolerated the procedure well without  complications.  Standard post-procedure care is explained and return  precautions are given.

## 2023-04-09 NOTE — Patient Instructions (Addendum)

## 2023-04-09 NOTE — Progress Notes (Signed)
    SUBJECTIVE:   CHIEF COMPLAINT / HPI:   Left gluteal lesion x2 months. States he had a similar lesion in the past that went away on its own. States it is occasionally itchy. No other symptoms reported.   PERTINENT  PMH / PSH: PMHx reviewed  OBJECTIVE:   BP 125/80   Pulse 100   Temp 98.4 F (36.9 C)   Wt 137 lb 9.6 oz (62.4 kg)   SpO2 95%   BMI 19.19 kg/m   Skin: lesion as noted below to left glute   ASSESSMENT/PLAN:   Skin lesion Differentials include wart, BCC, dermatofibroma, skin tag, SK epidermoid cyst Recommended surgical removal via shave biopsy Informed consent obtained, see procedure note below:  Skin Biopsy Procedure Note PRE-OP DIAGNOSIS: skin lesion POST-OP DIAGNOSIS: Same  PROCEDURE: skin biopsy Performing Physician: Dr. Rexene Alberts Supervising Physician (if applicable): Dr. Lum Babe  PROCEDURE:  Shave Biopsy         The area surrounding the skin lesion was prepared and draped in the  usual sterile manner. The lesion was removed in the usual manner by the  biopsy method noted above. Hemostasis was assured. The patient tolerated  the procedure well.  Closure:     Drysol and pressure for hemostasis         Followup: The patient tolerated the procedure well without  complications.  Standard post-procedure care is explained and return  precautions are given.    Para March, DO [facility]

## 2023-04-13 ENCOUNTER — Ambulatory Visit: Payer: Medicaid Other | Admitting: Physical Therapy

## 2023-04-13 ENCOUNTER — Telehealth: Payer: Self-pay | Admitting: Physical Medicine and Rehabilitation

## 2023-04-13 ENCOUNTER — Telehealth: Payer: Self-pay | Admitting: Rehabilitative and Restorative Service Providers"

## 2023-04-13 LAB — DERMATOLOGY PATHOLOGY

## 2023-04-13 NOTE — Telephone Encounter (Addendum)
LVM re: physical therapy order for [facility]. PT reached out to patient due to questions he had fre: appointments.   We contacted the patient to cx today's evaluation due to already a patient at [facility] specialty clinic. Plan to reach out to that PT prior to scheduling further sessions.  Jeannetta Cerutti, PT   Pt called me back and we started our conversation. Phone line was discontinued and no answer on 2nd attempt.  Gayl Ivanoff, PT

## 2023-04-13 NOTE — Telephone Encounter (Signed)
Patient called in to reschedule appt , states he needs to surgeon before he comes in , patient wanted to update Dr Shearon Stalls to inform her he had a biopsy and also wants to let her know that his pain has not gotten better. Patient has been rescheduled

## 2023-04-14 ENCOUNTER — Telehealth: Payer: Self-pay

## 2023-04-14 ENCOUNTER — Encounter: Payer: Self-pay | Admitting: Family Medicine

## 2023-04-14 ENCOUNTER — Emergency Department (HOSPITAL_COMMUNITY)
Admission: EM | Admit: 2023-04-14 | Discharge: 2023-04-15 | Discharge status: Against Medical Advice | Payer: Medicaid Other | Attending: Emergency Medicine | Admitting: Emergency Medicine

## 2023-04-14 DIAGNOSIS — R1033 Periumbilical pain: Secondary | ICD-10-CM | POA: Diagnosis not present

## 2023-04-14 DIAGNOSIS — R079 Chest pain, unspecified: Secondary | ICD-10-CM | POA: Insufficient documentation

## 2023-04-14 DIAGNOSIS — Z5321 Procedure and treatment not carried out due to patient leaving prior to being seen by health care provider: Secondary | ICD-10-CM | POA: Insufficient documentation

## 2023-04-14 LAB — CBC
HCT: 46.4 % (ref 39.0–52.0)
Hemoglobin: 16 g/dL (ref 13.0–17.0)
MCH: 34.4 pg — ABNORMAL HIGH (ref 26.0–34.0)
MCHC: 34.5 g/dL (ref 30.0–36.0)
MCV: 99.8 fL (ref 80.0–100.0)
Platelets: 238 10*3/uL (ref 150–400)
RBC: 4.65 MIL/uL (ref 4.22–5.81)
RDW: 12.4 % (ref 11.5–15.5)
WBC: 11 10*3/uL — ABNORMAL HIGH (ref 4.0–10.5)
nRBC: 0 % (ref 0.0–0.2)

## 2023-04-14 LAB — COMPREHENSIVE METABOLIC PANEL
ALT: 45 U/L — ABNORMAL HIGH (ref 0–44)
AST: 64 U/L — ABNORMAL HIGH (ref 15–41)
Albumin: 4.5 g/dL (ref 3.5–5.0)
Alkaline Phosphatase: 78 U/L (ref 38–126)
Anion gap: 13 (ref 5–15)
BUN: 7 mg/dL — ABNORMAL LOW (ref 8–23)
CO2: 24 mmol/L (ref 22–32)
Calcium: 8.9 mg/dL (ref 8.9–10.3)
Chloride: 101 mmol/L (ref 98–111)
Creatinine, Ser: 0.93 mg/dL (ref 0.61–1.24)
GFR, Estimated: >60 mL/min (ref >60)
Glucose, Bld: 113 mg/dL — ABNORMAL HIGH (ref 70–99)
Potassium: 3.3 mmol/L — ABNORMAL LOW (ref 3.5–5.1)
Sodium: 138 mmol/L (ref 135–145)
Total Bilirubin: 0.4 mg/dL (ref 0.3–1.2)
Total Protein: 7.7 g/dL (ref 6.5–8.1)

## 2023-04-14 LAB — TROPONIN I (HIGH SENSITIVITY): Troponin I (High Sensitivity): 2 ng/L (ref <18)

## 2023-04-14 LAB — LIPASE, BLOOD: Lipase: 42 U/L (ref 11–51)

## 2023-04-14 NOTE — ED Triage Notes (Addendum)
X 5 days , had a hernia meshed done 1 year ago, felt it pop 5 days ago, pt has tenderness around umbilical area and lower abd, pt wont let area be touched. Pt with ETOH use today.  Pt with diarrhea yesterday, nausea intermittently, no vomiting, Pt c/o chest pain, EMS denies pt having complaints for them, pt does report hx of anxiety.

## 2023-04-14 NOTE — Telephone Encounter (Signed)
Patient calls nurse line regarding results from recent skin biopsy. Verified name and date of birth.  Advised of results per note from Dr. Rexene Alberts.   Patient has no further questions at this time.   He will follow up as needed.   Veronda Prude, RN

## 2023-04-15 ENCOUNTER — Encounter: Payer: Medicaid Other | Admitting: Physical Medicine and Rehabilitation

## 2023-04-20 ENCOUNTER — Ambulatory Visit: Payer: Self-pay | Admitting: Family Medicine

## 2023-04-23 ENCOUNTER — Encounter (HOSPITAL_COMMUNITY): Payer: Medicaid Other | Admitting: Student

## 2023-04-27 ENCOUNTER — Encounter: Payer: Medicaid Other | Admitting: Physical Medicine and Rehabilitation

## 2023-04-29 ENCOUNTER — Encounter: Payer: Medicaid Other | Admitting: Physical Medicine and Rehabilitation

## 2023-05-21 ENCOUNTER — Encounter (HOSPITAL_COMMUNITY): Payer: Medicaid Other | Admitting: Student

## 2023-05-21 NOTE — Progress Notes (Deleted)
62 yom LV 2 mo ago - lifesaver F/u on severe AUD, hosp Naltrex 50 CDIOP decl Cont Lamotrigine 100 - pt reports benefit  Sobriety? MJ use  Plan  Last CMP 10/8 - need repeat

## 2023-05-28 ENCOUNTER — Other Ambulatory Visit: Payer: Self-pay | Admitting: Family Medicine

## 2023-05-28 DIAGNOSIS — Z720 Tobacco use: Secondary | ICD-10-CM

## 2023-08-06 ENCOUNTER — Other Ambulatory Visit: Payer: Self-pay | Admitting: Family Medicine

## 2023-08-11 ENCOUNTER — Other Ambulatory Visit: Payer: Self-pay | Admitting: Family Medicine

## 2023-08-11 DIAGNOSIS — Z720 Tobacco use: Secondary | ICD-10-CM

## 2023-10-08 ENCOUNTER — Other Ambulatory Visit: Payer: Self-pay | Admitting: Family Medicine

## 2023-10-08 DIAGNOSIS — Z720 Tobacco use: Secondary | ICD-10-CM

## 2023-10-22 ENCOUNTER — Ambulatory Visit: Admitting: Family Medicine

## 2023-11-09 NOTE — Progress Notes (Deleted)
    SUBJECTIVE:   CHIEF COMPLAINT / HPI:   Colonoscopy?  Elevated LFTs > repeat CMP, lipid panel, A1c  PERTINENT  PMH / PSH: ***  OBJECTIVE:   There were no vitals taken for this visit. ***  General: NAD, pleasant, able to participate in exam Cardiac: RRR, no murmurs. Respiratory: CTAB, normal effort, No wheezes, rales or rhonchi Abdomen: Bowel sounds present, nontender, nondistended Extremities: no edema or cyanosis. Skin: warm and dry, no rashes noted Neuro: alert, no obvious focal deficits Psych: Normal affect and mood  ASSESSMENT/PLAN:   No problem-specific Assessment & Plan notes found for this encounter.     Dr. Jonne Netters, DO [facility]    {    This will disappear when note is signed, click to select method of visit    :1}

## 2023-11-10 ENCOUNTER — Ambulatory Visit: Admitting: Family Medicine

## 2023-11-19 ENCOUNTER — Other Ambulatory Visit: Payer: Self-pay | Admitting: Family Medicine

## 2023-11-19 DIAGNOSIS — Z720 Tobacco use: Secondary | ICD-10-CM

## 2023-12-21 ENCOUNTER — Ambulatory Visit: Admitting: Family Medicine

## 2023-12-21 NOTE — Progress Notes (Deleted)
    SUBJECTIVE:   CHIEF COMPLAINT / HPI:   *Obtain colonoscopy results  PERTINENT  PMH / PSH: ***  OBJECTIVE:   There were no vitals taken for this visit. ***  General: NAD, pleasant, able to participate in exam Cardiac: RRR, no murmurs. Respiratory: CTAB, normal effort, No wheezes, rales or rhonchi Abdomen: Bowel sounds present, nontender, nondistended Extremities: no edema or cyanosis. Skin: warm and dry, no rashes noted Neuro: alert, no obvious focal deficits Psych: Normal affect and mood  ASSESSMENT/PLAN:   No problem-specific Assessment & Plan notes found for this encounter.     Dr. Jonne Netters, DO [facility]    {    This will disappear when note is signed, click to select method of visit    :1}

## 2024-01-05 ENCOUNTER — Ambulatory Visit: Admitting: Family Medicine

## 2024-01-05 DIAGNOSIS — F1090 Alcohol use, unspecified, uncomplicated: Secondary | ICD-10-CM

## 2024-01-11 ENCOUNTER — Ambulatory Visit (INDEPENDENT_AMBULATORY_CARE_PROVIDER_SITE_OTHER): Admitting: Family Medicine

## 2024-01-11 ENCOUNTER — Encounter: Payer: Self-pay | Admitting: Family Medicine

## 2024-01-11 VITALS — BP 127/88 | HR 87 | Ht 71.0 in | Wt 137.6 lb

## 2024-01-11 DIAGNOSIS — B182 Chronic viral hepatitis C: Secondary | ICD-10-CM | POA: Diagnosis not present

## 2024-01-11 DIAGNOSIS — R079 Chest pain, unspecified: Secondary | ICD-10-CM

## 2024-01-11 DIAGNOSIS — J449 Chronic obstructive pulmonary disease, unspecified: Secondary | ICD-10-CM | POA: Diagnosis present

## 2024-01-11 DIAGNOSIS — M51362 Other intervertebral disc degeneration, lumbar region with discogenic back pain and lower extremity pain: Secondary | ICD-10-CM

## 2024-01-11 DIAGNOSIS — F319 Bipolar disorder, unspecified: Secondary | ICD-10-CM

## 2024-01-11 DIAGNOSIS — F1020 Alcohol dependence, uncomplicated: Secondary | ICD-10-CM | POA: Diagnosis not present

## 2024-01-11 DIAGNOSIS — H579 Unspecified disorder of eye and adnexa: Secondary | ICD-10-CM | POA: Diagnosis not present

## 2024-01-11 DIAGNOSIS — F5101 Primary insomnia: Secondary | ICD-10-CM

## 2024-01-11 LAB — POCT GLYCOSYLATED HEMOGLOBIN (HGB A1C): Hemoglobin A1C: 5.6 % (ref 4.0–5.6)

## 2024-01-11 MED ORDER — UMECLIDINIUM-VILANTEROL 62.5-25 MCG/ACT IN AEPB
1.0000 | INHALATION_SPRAY | Freq: Every day | RESPIRATORY_TRACT | 2 refills | Status: DC
Start: 2024-01-11 — End: 2024-04-06

## 2024-01-11 MED ORDER — OLOPATADINE HCL 0.2 % OP SOLN
1.0000 [drp] | Freq: Every day | OPHTHALMIC | 12 refills | Status: AC
Start: 2024-01-11 — End: ?

## 2024-01-11 MED ORDER — TRAZODONE HCL 50 MG PO TABS
25.0000 mg | ORAL_TABLET | Freq: Every evening | ORAL | 3 refills | Status: DC | PRN
Start: 2024-01-11 — End: 2024-03-01

## 2024-01-11 MED ORDER — IBUPROFEN 600 MG PO TABS
600.0000 mg | ORAL_TABLET | Freq: Three times a day (TID) | ORAL | 0 refills | Status: DC | PRN
Start: 2024-01-11 — End: 2024-04-18

## 2024-01-11 NOTE — Patient Instructions (Signed)
 It was wonderful to see you today! Thank you for choosing [facility].   Please bring ALL of your medications with you to every visit.   Today we talked about:  I am glad that you came in today to address your health concerns.  We may need an additional visit to discuss things further we tried to make a dent today. For your COPD I would like you to start on Anoro inhaler is a daily maintenance inhaler that you will take 1 puff of daily.  You can continue to take your albuterol  as needed for symptom relief.  Please try to quit smoking as this is likely making your symptoms worse. It looks like your itchy eyes may be due to an allergy response, I would like you to try the Pataday  eyedrops which can help with this as prolonged use of steroid eyedrops can cause more issues. For your worsening anxiety and stabilization of your bipolar symptoms I am referring you to psychiatry to discuss additional options.  I will refill the trazodone  that can help you with sleep please discuss additional management options with them further. For your chronic back, leg and feet pain, we can try to get you to another pain management doctor since you do not have success with [facility].  Please take Tylenol  and ibuprofen  as needed for pain relief but I do not recommend long-term use of ibuprofen  as this can impact your kidneys and GI system. I would like you to come back and get some lab work done to evaluate your cholesterol and kidney and liver function further, please schedule a lab appointment on your way out.  Please follow up in 2 weeks  If you haven't already, sign up for My Chart to have easy access to your labs results, and communication with your primary care physician.  Call the clinic at [phone] if your symptoms worsen or you have any concerns.  Please be sure to schedule follow up at the front desk before you leave today.   Izetta Nap, DO Family Medicine

## 2024-01-11 NOTE — Progress Notes (Unsigned)
 SUBJECTIVE:   CHIEF COMPLAINT / HPI:   Itching of the eyes Has had persistent symptoms for a while.  Using steroid eyedrops that tend to help.  Recently seen by [facility], had work up and showed he has dry eyes and cataracts.   Chest pain Happening every other day. Can have stabbing pain. Happens anytime. Not associated with food trigger.  States he does occasionally have some shortness of breath but thinks it associated with wheezing.  Taking ASA 81 mg daily  Pain in back, feet and right leg Chronic, has seen multiple pain management doctors.  Reports he was being seen at [facility] but they dismissed him from the practice since he failed a random drug screen.  Reports he did see [facility] and they declined additional medication management.  Reports he would like a second opinion.  COPD Taking Albuterol  2-3 x day. Smoking between none to 1/2 pack per day, varies.   Bipolar 1, GAD Feels like his current regimen is not working, needs help managing his bipolar and anxiety.  Feels like his symptoms have become pretty severe.  States he his self-medicating occasionally using marijuana and alcohol.  Does have history of alcohol use disorder, no longer taking naltrexone .  PERTINENT  PMH / PSH: HTN, COPD, alcohol use disorder, hep C, GERD, OSA  OBJECTIVE:   BP 127/88   Pulse 87   Ht 5' 11 (1.803 m)   Wt 137 lb 9.6 oz (62.4 kg)   SpO2 98%   BMI 19.19 kg/m    General: NAD, pleasant, able to participate in exam HEENT: TM clear bilaterally.  Some mild conjunctivitis bilaterally, no drainage or tearing from the eyes noted.  No palpable cervical lymphadenopathy. Cardiac: RRR, no murmurs. Respiratory: CTAB, normal effort on room air.  Mild wheezing noted into lung fields.  No other focal consolidation. Abdomen: Bowel sounds present, nontender, nondistended Extremities: no edema or cyanosis. Skin: warm and dry, no rashes noted Neuro: alert, no obvious focal deficits Psych:  Normal affect and mood  ASSESSMENT/PLAN:   Assessment & Plan Chronic obstructive pulmonary disease, unspecified COPD type (HCC) Given frequent use of albuterol  and significant pack-year history, likely underlying COPD.  Will initiate maintenance inhaler. -Anoro 1 puff daily -Follow-up in 1 month to assess benefit Chest pain, unspecified type No notable prior history of CAD but given frequent episodic chest pain occurring with activity and at rest concerning for underlying ischemic process.  Discussed additional workup but patient prefers referral to cardiology for management. -Referral to cardiology -BMP, lipid, TSH Bipolar 1 disorder (HCC) Denies symptoms of mania, symptoms poorly controlled and impacting ADLs.  Would like second opinion psychiatry. -Referral to psychiatry Degeneration of intervertebral disc of lumbar region with discogenic back pain and lower extremity pain Chronic, unfortunately has now seen multiple pain management doctors but has not received medication management due to history of failed drug screen.  Would like to discuss options with different pain management doctor, referred.  Requesting narcotics today, I told them I would not initiate long-term narcotic use and he requested ibuprofen  in the interim.  Discussed risk benefits, will provide 2-week course but recommended against long-term use. -Ibuprofen  600 mg every 8 hours as needed -Referral to pain management Alcohol use disorder, severe, dependence (HCC) Reporting intermittent alcohol use, not on naltrexone  currently.  Not currently interested in quitting. Primary insomnia Has done well on trazodone  previously, extensively discussed sleep hygiene. -Refill trazodone  25 mg nightly. Itchy eyes Likely secondary to allergic conjunctivitis,  recommend not continuing steroid eyedrops long-term.  Will trial Pataday  eyedrops for symptomatic relief. -Pataday  1 drop in each eye daily   Dr. Izetta Nap, DO [facility]  [facility]

## 2024-01-12 NOTE — Assessment & Plan Note (Signed)
 Chronic, unfortunately has now seen multiple pain management doctors but has not received medication management due to history of failed drug screen.  Would like to discuss options with different pain management doctor, referred.  Requesting narcotics today, I told them I would not initiate long-term narcotic use and he requested ibuprofen  in the interim.  Discussed risk benefits, will provide 2-week course but recommended against long-term use. -Ibuprofen  600 mg every 8 hours as needed -Referral to pain management

## 2024-01-12 NOTE — Assessment & Plan Note (Signed)
 Reporting intermittent alcohol use, not on naltrexone  currently.  Not currently interested in quitting.

## 2024-01-12 NOTE — Assessment & Plan Note (Signed)
 Denies symptoms of mania, symptoms poorly controlled and impacting ADLs.  Would like second opinion psychiatry. -Referral to psychiatry

## 2024-01-21 ENCOUNTER — Telehealth: Payer: Self-pay

## 2024-01-21 NOTE — Telephone Encounter (Signed)
 Patient calls nurse line voicing frustration over diagnose.   He reports he was reviewing his AVS and reports he does not care for the diagnose alcohol use disorder.   He reports he does not understand why this was associated with his A1c.  He reports this was hurtful to him and reports not nice.   Patient abruptly hung up the phone.   Will forward to PCP to make aware.

## 2024-01-27 NOTE — Progress Notes (Deleted)
 Psychiatric Initial Adult Assessment   Patient Identification: Carlos Montoya MRN:  [id number] Date of Evaluation:  02/03/2024 Referral Source: Primary care provider Chief Complaint:  No chief complaint on file.  Visit Diagnosis: No diagnosis found.   Assessment:  Carlos Montoya is a 63 y.o. male with a history of bipolar 1 disorder, alcohol use disorder, insomnia who presents in person to [facility] for initial evaluation on 02/03/2024.    At initial evaluation patient reports ***  A number of assessments were performed during the evaluation today including  PHQ-9 which they scored a *** on, GAD-7 which they scored a *** on, and [place] suicide severity screening which showed ***.    Risk Assessment: A suicide and violence risk assessment was performed as part of this evaluation. There patient is deemed to be at chronic elevated risk for self-harm/suicide given the following factors: {SABSUICIDERISKFACTORS:29780}. These risk factors are mitigated by the following factors: {SABSUICIDEPROTECTIVEFACTORS:29779}. The patient is deemed to be at chronic elevated risk for violence given the following factors: {SABVIOLENCERISKFACTORS:29781}. These risk factors are mitigated by the following factors: {SABVIOLENCEPROTECTIVEFACTORS:29782}. There is no acute risk for suicide or violence at this time. The patient was educated about relevant modifiable risk factors including following recommendations for treatment of psychiatric illness and abstaining from substance abuse.  While future psychiatric events cannot be accurately predicted, the patient does not  currently require  acute inpatient psychiatric care and does not currently meet [place]  involuntary commitment criteria.  Patient was given contact information for crisis resources, behavioral health clinic and was instructed to call 911 for emergencies.    Plan: # *** Past medication trials:   Status of problem: *** Interventions: -- ***  # *** Past medication trials:  Status of problem: *** Interventions: -- ***  # *** Past medication trials:  Status of problem: *** Interventions: -- ***   History of Present Illness:  ***  Associated Signs/Symptoms: Depression Symptoms:  {DEPRESSION SYMPTOMS:20000} (Hypo) Manic Symptoms:  {BHH MANIC SYMPTOMS:22872} Anxiety Symptoms:  {BHH ANXIETY SYMPTOMS:22873} Psychotic Symptoms:  {BHH PSYCHOTIC SYMPTOMS:22874} PTSD Symptoms: {BHH PTSD SYMPTOMS:22875}  Past Psychiatric History:  Past psychiatric diagnoses: Bipolar 1 disorder, alcohol use disorder, primary insomnia Psychiatric hospitalizations: None Past suicide attempts: Denies Hx of self harm: Denied this Hx of violence towards others: Denies Prior psychiatric providers: None Prior therapy: None Access to firearms: Denies  Prior medication trials: Trazodone  25 mg nightly, naltrexone , Remeron  15 mg, Prozac, Zoloft, lamotrigine , prazosin   Substance use: ***  Past Medical History:  Past Medical History:  Diagnosis Date   Acid reflux    Alcohol use disorder, mild, abuse    Anxiety    Bipolar disorder (HCC)    COPD (chronic obstructive pulmonary disease) (HCC)    Depression    History of hepatitis C 2016   treated   Opioid dependence (HCC)    Pneumonia    Umbilical hernia     Past Surgical History:  Procedure Laterality Date   COLONOSCOPY     HERNIA REPAIR     TONSILLECTOMY     UMBILICAL HERNIA REPAIR N/A 03/14/2022   Procedure: LAPAROSCOPIC ASSISTED REPAIR UMBILICAL HERNIA WITH MESH;  Surgeon: Tanda Locus, MD;  Location: [facility];  Service: General;  Laterality: N/A;    Family Psychiatric History: ***  Family History:  Family History  Problem Relation Age of Onset   Diabetes Mother    Heart disease Father    Colon cancer Father     Social  History:   Social History   Socioeconomic History   Marital status: Single    Spouse name: Not on file    Number of children: Not on file   Years of education: Not on file   Highest education level: Not on file  Occupational History   Not on file  Tobacco Use   Smoking status: Every Day    Current packs/day: 0.50    Types: Cigarettes   Smokeless tobacco: Never  Vaping Use   Vaping status: Never Used  Substance and Sexual Activity   Alcohol use: Yes    Comment: 1 beer today   Drug use: No   Sexual activity: Not on file  Other Topics Concern   Not on file  Social History Narrative   Not on file   Social Drivers of Health   Financial Resource Strain: Not on file  Food Insecurity: No Food Insecurity (03/07/2023)   Hunger Vital Sign    Worried About Running Out of Food in the Last Year: Never true    Ran Out of Food in the Last Year: Never true  Transportation Needs: No Transportation Needs (03/07/2023)   PRAPARE - Administrator, Civil Service (Medical): No    Lack of Transportation (Non-Medical): No  Physical Activity: Not on file  Stress: Not on file  Social Connections: Not on file    Additional Social History: ***  Allergies:   Allergies  Allergen Reactions   Prednisone Hives   Diphenhydramine  Hcl Other (See Comments)    Has opposite effect   Tylenol  [Acetaminophen ] Other (See Comments)    Told to avoid due to having hepatitis C previously    Metabolic Disorder Labs: Lab Results  Component Value Date   HGBA1C 5.6 01/11/2024   No results found for: PROLACTIN Lab Results  Component Value Date   CHOL 138 11/06/2021   TRIG 105 11/06/2021   HDL 52 11/06/2021   CHOLHDL 2.7 11/06/2021   LDLCALC 67 11/06/2021   LDLCALC 114 (H) 09/13/2020   Lab Results  Component Value Date   TSH 1.410 09/13/2020    Therapeutic Level Labs: No results found for: LITHIUM No results found for: CBMZ No results found for: VALPROATE  Current Medications: Current Outpatient Medications  Medication Sig Dispense Refill   aspirin  EC 81 MG tablet Take 81 mg by  mouth daily. Swallow whole.     baclofen  (LIORESAL ) 10 MG tablet Take 1 tablet (10 mg total) by mouth 3 (three) times daily as needed for muscle spasms. 90 each 5   folic acid  (FOLVITE ) 1 MG tablet Take 1 tablet (1 mg total) by mouth daily. 30 tablet 0   gabapentin  (NEURONTIN ) 300 MG capsule Take 2 capsules (600 mg total) by mouth 2 (two) times daily. 30 capsule 0   ibuprofen  (ADVIL ) 600 MG tablet Take 1 tablet (600 mg total) by mouth every 8 (eight) hours as needed. 30 tablet 0   lamoTRIgine  (LAMICTAL ) 100 MG tablet Take 1 tablet (100 mg total) by mouth daily. 30 tablet 2   Olopatadine  HCl (PATADAY ) 0.2 % SOLN Apply 1 drop to eye daily. 2.5 mL 12   omeprazole  (PRILOSEC) 20 MG capsule TAKE 1 CAPSULE BY MOUTH EVERY DAY 90 capsule 1   thiamine  (VITAMIN B-1) 100 MG tablet Take 1 tablet (100 mg total) by mouth daily. 30 tablet 0   traZODone  (DESYREL ) 50 MG tablet Take 0.5 tablets (25 mg total) by mouth at bedtime as needed for sleep. 30 tablet 3  umeclidinium-vilanterol (ANORO ELLIPTA ) 62.5-25 MCG/ACT AEPB Inhale 1 puff into the lungs daily. 60 each 2   VENTOLIN  HFA 108 (90 Base) MCG/ACT inhaler INHALE 1-2 PUFFS INTO THE LUNGS EVERY 4 HOURS AS NEEDED FOR WHEEZING OR SHORTNESS OF BREATH. 18 each 1   No current facility-administered medications for this visit.    Musculoskeletal: Strength & Muscle Tone: {desc; muscle tone:32375} Gait & Station: {PE GAIT ED WJUO:77474} Patient leans: {Patient Leans:21022755}  Psychiatric Specialty Exam:  Psychiatric Specialty Exam: There were no vitals taken for this visit.There is no height or weight on file to calculate BMI. Review of Systems  General Appearance: {Appearance:22683}  Eye Contact:  {BHH EYE CONTACT:22684}  Speech:  {Speech:22685}  Volume:  {Volume (PAA):22686}  Mood:  {BHH MOOD:22306}  Affect:  {Affect (PAA):22687}  Thought Content: {Thought Content:22690}   Suicidal Thoughts:  {ST/HT (PAA):22692}  Homicidal Thoughts:  {ST/HT (PAA):22692}   Thought Process:  {Thought Process (PAA):22688}  Orientation:  {BHH ORIENTATION (PAA):22689}    Memory: {BHH MEMORY:22881}  Judgment:  {Judgement (PAA):22694}  Insight:  {Insight (PAA):22695}  Concentration:  {Concentration:21399}  Recall:  not formally assessed ***  Fund of Knowledge: {BHH GOOD/FAIR/POOR:22877}  Language: {BHH GOOD/FAIR/POOR:22877}  Psychomotor Activity:  {Psychomotor (PAA):22696}  Akathisia:  {BHH YES OR NO:22294}  AIMS (if indicated): {Desc; done/not:10129}  Assets:  {Assets (PAA):22698}  ADL's:  {BHH JIO'D:77709}  Cognition: {chl bhh cognition:304700322}  Sleep:  {BHH GOOD/FAIR/POOR:22877}    Screenings: [employer] [facility] Office Visit from 01/11/2024 in [facility] - [facility] Of [facility] Office Visit from 03/20/2023 in [facility] - A Dept Of [facility]. [facility] Office Visit from 12/17/2022 in [facility] Office Visit from 09/03/2022 in [facility] Medicine and Rehabilitation Office Visit from 08/06/2022 in [facility] Physical Medicine and Rehabilitation  PHQ-2 Total Score 6 6 6  0 6  PHQ-9 Total Score 21 19 -- -- 18   Flowsheet Row ED from 04/14/2023 in [facility] Emergency Department at [facility] ED to Hosp-Admission (Discharged) from 03/06/2023 in [facility] ED from 01/20/2023 in [facility] Emergency Department at [facility]  C-SSRS RISK CATEGORY No Risk No Risk No Risk     Collaboration of Care: {BH OP Collaboration of Care:21014065}  Patient/Guardian was advised Release of Information must be obtained prior to any record release in order to collaborate their care with an outside provider. Patient/Guardian was advised if they have not already done so to contact the registration department to sign all necessary forms in order for us  to release information regarding their care.   Consent: Patient/Guardian gives  verbal consent for treatment and assignment of benefits for services provided during this visit. Patient/Guardian expressed understanding and agreed to proceed.   Shawnna Pancake, MD 7/30/20252:50 PM

## 2024-02-08 ENCOUNTER — Ambulatory Visit (HOSPITAL_COMMUNITY)

## 2024-02-08 ENCOUNTER — Ambulatory Visit: Admitting: Family Medicine

## 2024-02-08 NOTE — Progress Notes (Deleted)
    SUBJECTIVE:   CHIEF COMPLAINT / HPI:   COPD f/u Initiate Anoro inhaler at appt on 01/11/2024.  Colonoscopy*  PERTINENT  PMH / PSH: ***  OBJECTIVE:   There were no vitals taken for this visit. ***  General: NAD, pleasant, able to participate in exam Cardiac: RRR, no murmurs. Respiratory: CTAB, normal effort, No wheezes, rales or rhonchi Abdomen: Bowel sounds present, nontender, nondistended Extremities: no edema or cyanosis. Skin: warm and dry, no rashes noted Neuro: alert, no obvious focal deficits Psych: Normal affect and mood  ASSESSMENT/PLAN:   No problem-specific Assessment & Plan notes found for this encounter.     Dr. Izetta Nap, DO [facility]    {    This will disappear when note is signed, click to select method of visit    :1}

## 2024-02-14 ENCOUNTER — Other Ambulatory Visit: Payer: Self-pay | Admitting: Family Medicine

## 2024-03-01 ENCOUNTER — Encounter: Payer: Self-pay | Admitting: Family Medicine

## 2024-03-01 ENCOUNTER — Ambulatory Visit (INDEPENDENT_AMBULATORY_CARE_PROVIDER_SITE_OTHER): Admitting: Family Medicine

## 2024-03-01 VITALS — BP 118/88 | HR 87 | Ht 71.0 in | Wt 140.0 lb

## 2024-03-01 DIAGNOSIS — H579 Unspecified disorder of eye and adnexa: Secondary | ICD-10-CM | POA: Diagnosis not present

## 2024-03-01 DIAGNOSIS — F5101 Primary insomnia: Secondary | ICD-10-CM

## 2024-03-01 DIAGNOSIS — B182 Chronic viral hepatitis C: Secondary | ICD-10-CM | POA: Diagnosis not present

## 2024-03-01 DIAGNOSIS — Z1211 Encounter for screening for malignant neoplasm of colon: Secondary | ICD-10-CM | POA: Diagnosis not present

## 2024-03-01 DIAGNOSIS — M25551 Pain in right hip: Secondary | ICD-10-CM

## 2024-03-01 DIAGNOSIS — K4090 Unilateral inguinal hernia, without obstruction or gangrene, not specified as recurrent: Secondary | ICD-10-CM

## 2024-03-01 DIAGNOSIS — F17200 Nicotine dependence, unspecified, uncomplicated: Secondary | ICD-10-CM

## 2024-03-01 MED ORDER — NICOTINE 21 MG/24HR TD PT24: 21.0000 mg | MEDICATED_PATCH | Freq: Every day | TRANSDERMAL | 5 refills | Status: AC

## 2024-03-01 MED ORDER — LEVOCETIRIZINE DIHYDROCHLORIDE 5 MG PO TABS: 5.0000 mg | ORAL_TABLET | Freq: Every evening | ORAL | 3 refills | Status: DC

## 2024-03-01 MED ORDER — TRAZODONE HCL 50 MG PO TABS: 50.0000 mg | ORAL_TABLET | Freq: Every evening | ORAL | 3 refills | Status: AC | PRN

## 2024-03-01 NOTE — Progress Notes (Unsigned)
    SUBJECTIVE:   CHIEF COMPLAINT / HPI:   COPD Started on Anoro in 01/2024. Working fairly well. Denies SOB.   Growth in groin - swelling but does not hurt. X 2 weeks. Non-painful or itching. Getting bigger. Not drainage.   Itchy eyes and ears. Follows with [facility].   Tobacco use - can smoke up at pack a day.   Trazodone  - some help but not completely.   PERTINENT  PMH / PSH: HTN, COPD, alcohol use disorder, hep C, GERD, OSA   OBJECTIVE:   There were no vitals taken for this visit. ***  General: NAD, pleasant, able to participate in exam Cardiac: RRR, no murmurs. Respiratory: CTAB, normal effort, No wheezes, rales or rhonchi Abdomen: Bowel sounds present, nontender, nondistended Extremities: no edema or cyanosis. Skin: warm and dry, no rashes noted Neuro: alert, no obvious focal deficits Psych: Normal affect and mood  ASSESSMENT/PLAN:   No problem-specific Assessment & Plan notes found for this encounter.     Dr. Izetta Nap, DO [facility]    {    This will disappear when note is signed, click to select method of visit    :1}

## 2024-03-01 NOTE — Patient Instructions (Addendum)
 It was wonderful to see you today! Thank you for choosing [facility].   Please bring ALL of your medications with you to every visit.   Today we talked about:  For the itching around your eyes and ears we can try an oral antihistamine that you take daily.  I sent you in levocetirizine or Xyzal , please take it every day to see if it relieves her symptoms. I also sent in the Cologuard for colon cancer screening, if it is positive we will need to consider getting you to another GI doctor for colonoscopy. I sent in the nicotine  patches to help you stop smoking.  Please take them off for you go to bed at night as it can cause some insomnia.  Please try to set some goals for reducing your cigarette intake. You do have an inguinal hernia, I did place a referral to the general surgeon so they can discuss hernia repair with you.  Our office will process this referral, please reach out in 1 to 2 weeks if you have not heard back from us . Please see the referral information for the Cardiologist:  [facility] [address] Floor [place], [place] [address] [phone]  2. Please see the referral information for the Pain Management:  [facility] [address], [place], [place] [address] Phone: [phone]  Please follow up in 3 months   If you haven't already, sign up for My Chart to have easy access to your labs results, and communication with your primary care physician.   We are checking some labs today. If they are abnormal, I will call you. If they are normal, I will send you a MyChart message (if it is active) or a letter in the mail. If you do not hear about your labs in the next 2 weeks, please call the office.  Call the clinic at [phone] if your symptoms worsen or you have any concerns.  Please be sure to schedule follow up at the front desk before you leave today.   Izetta Nap, DO Family Medicine

## 2024-03-02 LAB — HCV RNA BY PCR, QN RFX GENO

## 2024-03-02 NOTE — Assessment & Plan Note (Signed)
 Smoking up to a pack a day, desires cessation. -Nicotine  patch 21 mg daily, advised to take off before bed -Encouraged to set a quit date

## 2024-03-02 NOTE — Assessment & Plan Note (Signed)
 Prior history of hepatitis C but received treatment in 2018, some continued alcohol use therefore concern for reactivation. -CMP, HCV RNA, HBV surface antibody

## 2024-03-03 ENCOUNTER — Telehealth: Payer: Self-pay

## 2024-03-03 DIAGNOSIS — J449 Chronic obstructive pulmonary disease, unspecified: Secondary | ICD-10-CM

## 2024-03-03 DIAGNOSIS — F17200 Nicotine dependence, unspecified, uncomplicated: Secondary | ICD-10-CM

## 2024-03-03 LAB — COMPREHENSIVE METABOLIC PANEL WITH GFR
ALT: 26 IU/L (ref 0–44)
AST: 43 IU/L — ABNORMAL HIGH (ref 0–40)
Albumin: 4.4 g/dL (ref 3.9–4.9)
Alkaline Phosphatase: 85 IU/L (ref 44–121)
BUN/Creatinine Ratio: 8 — ABNORMAL LOW (ref 10–24)
BUN: 7 mg/dL — ABNORMAL LOW (ref 8–27)
Bilirubin Total: 0.3 mg/dL (ref 0.0–1.2)
CO2: 22 mmol/L (ref 20–29)
Calcium: 9.8 mg/dL (ref 8.6–10.2)
Chloride: 101 mmol/L (ref 96–106)
Creatinine, Ser: 0.83 mg/dL (ref 0.76–1.27)
Globulin, Total: 2.6 g/dL (ref 1.5–4.5)
Glucose: 80 mg/dL (ref 70–99)
Potassium: 4.3 mmol/L (ref 3.5–5.2)
Sodium: 140 mmol/L (ref 134–144)
Total Protein: 7 g/dL (ref 6.0–8.5)
eGFR: 99 mL/min/1.73 (ref >59)

## 2024-03-03 LAB — HEPATITIS B SURFACE ANTIBODY, QUANTITATIVE: Hepatitis B Surf Ab Quant: 19.6 m[IU]/mL

## 2024-03-03 LAB — LIPID PANEL
Chol/HDL Ratio: 3.4 ratio (ref 0.0–5.0)
Cholesterol, Total: 202 mg/dL — ABNORMAL HIGH (ref 100–199)
HDL: 59 mg/dL (ref >39)
LDL Chol Calc (NIH): 116 mg/dL — ABNORMAL HIGH (ref 0–99)
Triglycerides: 156 mg/dL — ABNORMAL HIGH (ref 0–149)
VLDL Cholesterol Cal: 27 mg/dL (ref 5–40)

## 2024-03-03 LAB — HCV RNA BY PCR, QN RFX GENO: HCV Quant Baseline: NOT DETECTED [IU]/mL

## 2024-03-03 NOTE — Telephone Encounter (Signed)
 Patient calls nurse line regarding follow up from recent visit on 03/01/24.   He is requesting addition of CPK, if possible. Advised that I was unsure of process for lab add on, however, I would reach out to PCP and [place].   Patient is also requesting screening for lung cancer. He reports that he has been smoking for the last 40 years and that he is currently smoking between a few cigarettes to a pack per day. He was originally asking for chest x-ray to be performed at the same time as hip x-ray. Advised patient that I would reach out to provider, however, these screenings are usually performed by CT scan.   Please advise.   Chiquita JAYSON English, RN

## 2024-03-04 ENCOUNTER — Ambulatory Visit: Payer: Self-pay | Admitting: Family Medicine

## 2024-03-04 NOTE — Telephone Encounter (Signed)
 Attempted to call patient regarding lab results.  Left voicemail.  Kidney and liver function is stable and liver enzymes have improved from prior testing.  Recommend continuing to abstain from alcohol.    Negative for any hepatitis C virus and good immunity to hepatitis B.  PREVENT score 8.8%, 10-year risk. Recommend initiation with a moderate intensity statin for cholesterol management.  Would start rosuvastatin 10 mg daily if patient agreeable.  Repeat lipid panel in 3 to 6 months.  Izetta Nap, DO

## 2024-03-04 NOTE — Telephone Encounter (Signed)
 Attempted to contact patient regarding request for lung cancer screening.  Meets criteria given 30+ pack year history is a current smoker of 1 pack/day given smoking intermittent about the past 40 years.  Will order low-dose CT screening.  Will forward to team to assist with scheduling.  Unsure of patient reasoning for CK testing, no indication clinically for this at this time.  Izetta Nap, DO

## 2024-03-14 ENCOUNTER — Ambulatory Visit (HOSPITAL_COMMUNITY)

## 2024-03-15 ENCOUNTER — Telehealth: Payer: Self-pay

## 2024-03-15 NOTE — Telephone Encounter (Signed)
 Referral sent to: [facility] [address] [place], [place] [address] Get Directions Tel [phone] Fax [phone] They will call pt to schedule an appointment  Margit Dimes, CMA

## 2024-03-15 NOTE — Telephone Encounter (Signed)
 Attempted to call patient, however no answer.   VM left asking patient to return my call after 1:30p to discuss general surgery.

## 2024-03-15 NOTE — Telephone Encounter (Signed)
 Patient returns call to nurse line. Advised of referral information.   Carlos JAYSON English, RN

## 2024-03-15 NOTE — Telephone Encounter (Signed)
 Patient calls nurse in regards to his hernia.   He reports PCP diagnosed him at recent visit and sent a referral to general surgery on 8/26.  He reports this morning when he took a shower he noticed the hernia has grown to the size of a plum.  He denies any pains and denies trouble ambulating.   He is requesting an update on referral.   Will forward to referral coordinator.   Precautions discussed with patient. Will call him with surgery information.

## 2024-03-18 ENCOUNTER — Ambulatory Visit (HOSPITAL_COMMUNITY)
Admission: RE | Admit: 2024-03-18 | Discharge: 2024-03-18 | Disposition: A | Discharge status: Home / Self Care | Source: Ambulatory Visit | Attending: Family Medicine | Admitting: Family Medicine

## 2024-03-18 DIAGNOSIS — F17200 Nicotine dependence, unspecified, uncomplicated: Secondary | ICD-10-CM | POA: Diagnosis present

## 2024-03-18 DIAGNOSIS — J449 Chronic obstructive pulmonary disease, unspecified: Secondary | ICD-10-CM | POA: Insufficient documentation

## 2024-03-21 LAB — COLOGUARD: COLOGUARD: NEGATIVE

## 2024-03-28 ENCOUNTER — Telehealth: Payer: Self-pay

## 2024-03-28 NOTE — Telephone Encounter (Signed)
 Patient calls nurse line requesting results on CT Chest and Cologuard.  Advised CT is not back yet. Advised on 3-4 week time and advised we will call him once we receive the report from Radiology.   Advised of negative Cologuard test and advised he should be getting a letter in the mail soon.   Patient was appreciative of time and information.

## 2024-04-01 ENCOUNTER — Other Ambulatory Visit: Payer: Self-pay

## 2024-04-01 ENCOUNTER — Ambulatory Visit: Payer: Self-pay | Admitting: Family Medicine

## 2024-04-01 DIAGNOSIS — I2510 Atherosclerotic heart disease of native coronary artery without angina pectoris: Secondary | ICD-10-CM

## 2024-04-01 MED ORDER — ROSUVASTATIN CALCIUM 10 MG PO TABS: 10.0000 mg | ORAL_TABLET | Freq: Every day | ORAL | 1 refills | Status: DC

## 2024-04-01 NOTE — Addendum Note (Signed)
 Addended by: THEOPHILUS PAGAN on: 04/01/2024 04:17 PM   Modules accepted: Orders

## 2024-04-01 NOTE — Telephone Encounter (Signed)
 Attempted to call patient, left voicemail.  CT chest for low dose lung cancer screening negative for lung cancer concern.  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  Patient already on ASA 81 mg daily but recommend initiation of statin therapy or other cholesterol management.  If patient agreeable, would start rosuvastatin  10 mg daily.  Izetta Nap, DO

## 2024-04-01 NOTE — Telephone Encounter (Signed)
 Patient had LVM on nurse line regarding several concerns. Returned call to patient. Verified name and DOB.   Patient feels that Trazodone  is not working. He will sleep for 3 hours and then be awake for 3 hours. He sometimes is unable to fall back to sleep.  He is also requesting refill on Ibuprofen  800 mg. Advised patient that 800 mg is not on his medication list and that Ibuprofen  600 was last prescribed by Dr. Theophilus. Patient states that 800 helps slightly more with back pain and neuropathy.  Patient also inquiring about CT results. Advised of CT results per note from Dr. Theophilus. *Patient is agreeable to starting statin. Please send to [facility] on [address].  Patient also reports issues with contacting [facility] pain management. He reports that he has called 4 times and is never able to get a response. Requesting further advise on contacting office or referral to another pain management provider. *spoke with Margit regarding concern- She will send updated referral to [facility].   Advised patient that due to complexity of concerns, he would likely need to schedule follow up visit.   Patient asks that I send message to PCP.   Chiquita JAYSON English, RN

## 2024-04-01 NOTE — Telephone Encounter (Signed)
-----   Message from Laymon Legions sent at 03/28/2024  1:23 PM EDT -----  ----- Message ----- From: Interface, Rad Results In Sent: 03/28/2024  11:17 AM EDT To: Laymon JINNY Legions, MD
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
## 2024-04-05 ENCOUNTER — Ambulatory Visit (HOSPITAL_BASED_OUTPATIENT_CLINIC_OR_DEPARTMENT_OTHER): Admitting: Psychiatry

## 2024-04-05 ENCOUNTER — Other Ambulatory Visit: Payer: Self-pay | Admitting: Family Medicine

## 2024-04-05 ENCOUNTER — Encounter (HOSPITAL_COMMUNITY): Payer: Self-pay | Admitting: Psychiatry

## 2024-04-05 ENCOUNTER — Other Ambulatory Visit: Payer: Self-pay

## 2024-04-05 VITALS — BP 132/73 | HR 74 | Ht 71.0 in | Wt 142.2 lb

## 2024-04-05 DIAGNOSIS — F331 Major depressive disorder, recurrent, moderate: Secondary | ICD-10-CM | POA: Diagnosis not present

## 2024-04-05 DIAGNOSIS — F411 Generalized anxiety disorder: Secondary | ICD-10-CM | POA: Diagnosis not present

## 2024-04-05 DIAGNOSIS — F1291 Cannabis use, unspecified, in remission: Secondary | ICD-10-CM | POA: Diagnosis not present

## 2024-04-05 DIAGNOSIS — F4310 Post-traumatic stress disorder, unspecified: Secondary | ICD-10-CM

## 2024-04-05 DIAGNOSIS — J449 Chronic obstructive pulmonary disease, unspecified: Secondary | ICD-10-CM

## 2024-04-05 DIAGNOSIS — F1021 Alcohol dependence, in remission: Secondary | ICD-10-CM

## 2024-04-05 MED ORDER — PRAZOSIN HCL 1 MG PO CAPS: 1.0000 mg | ORAL_CAPSULE | Freq: Every day | ORAL | 2 refills | Status: AC

## 2024-04-05 MED ORDER — DULOXETINE HCL 30 MG PO CPEP: 30.0000 mg | ORAL_CAPSULE | Freq: Every day | ORAL | 2 refills | Status: DC

## 2024-04-05 MED ORDER — TRAZODONE HCL 100 MG PO TABS
100.0000 mg | ORAL_TABLET | Freq: Every day | ORAL | 2 refills | Status: AC
Start: 2024-04-05 — End: ?

## 2024-04-18 ENCOUNTER — Ambulatory Visit (INDEPENDENT_AMBULATORY_CARE_PROVIDER_SITE_OTHER): Admitting: Family Medicine

## 2024-04-18 DIAGNOSIS — M25551 Pain in right hip: Secondary | ICD-10-CM

## 2024-04-18 DIAGNOSIS — K4090 Unilateral inguinal hernia, without obstruction or gangrene, not specified as recurrent: Secondary | ICD-10-CM | POA: Diagnosis not present

## 2024-04-18 DIAGNOSIS — M25552 Pain in left hip: Secondary | ICD-10-CM | POA: Diagnosis not present

## 2024-04-18 DIAGNOSIS — M51362 Other intervertebral disc degeneration, lumbar region with discogenic back pain and lower extremity pain: Secondary | ICD-10-CM

## 2024-04-18 DIAGNOSIS — F17200 Nicotine dependence, unspecified, uncomplicated: Secondary | ICD-10-CM

## 2024-04-18 MED ORDER — IBUPROFEN 600 MG PO TABS: 600.0000 mg | ORAL_TABLET | Freq: Three times a day (TID) | ORAL | 0 refills | Status: AC | PRN

## 2024-04-18 NOTE — Progress Notes (Signed)
 Virtual Visit via Telephone Note  I connected with [name] on 04/18/24 at  3:10 PM EDT by telephone and verified that I am speaking with the correct person using two identifiers.  Location: Patient: Carlos Nap, DO - [facility] Provider: Norleen Browner - Home   I discussed the limitations, risks, security and privacy concerns of performing an evaluation and management service by telephone and the availability of in person appointments. I also discussed with the patient that there may be a patient responsible charge related to this service. The patient expressed understanding and agreed to proceed.   History of Present Illness: HLD  Initiated on rosuvastatin  10 mg daily at last visit. Taking daily, no issue issues with it.   Insomnia Psychiatry went up to Trazadone 100mg  at night. Saw Dr. Carvin who changed his psych medications. Prazosin  1mg  at night, but woke up and felt like he was going to pass out so he stopped taking it.   Appointment with pain management on 10/29.   Appointment with Cardiology on 11/2.  Did not make it to [facility] imaging to have hip. Also having left hip pain.   Planning of hernia repair with [facility].  Tobacco use - trying to cut back. Using nicotine  patch intermittently. Smoking 4-5 cigarette/day. Using tootsie pops to help him wean back some.   Feels like he is trying to change his diet. Cutting back on fried food. Has a new book, trying to eat more apples, nuts and seeds.    Observations/Objective: NAD, speaking in full sentences.  Assessment and Plan: Assessment & Plan Bilateral hip pain Chronic, right > left.  Pending x-ray imaging of right hip, ordered left hip imaging as well.  Planning to see pain management on 10/29, requested refill of ibuprofen  until that time.  Provided short supply, advised on potential side effects and recommended against long-term/regular use. -Left hip x-ray -Refill ibuprofen  600 mg every 8 hours for limited  use Tobacco use disorder Now down to 4 to 5 cigarettes/day, utilizing nicotine  patches and replacement behaviors. Non-recurrent unilateral inguinal hernia without obstruction or gangrene Pending repair by Gen Surg, attempting to optimize preop by reducing tobacco use as above.    Follow Up Instructions: - Follow-up in 3 months or sooner if needed   I discussed the assessment and treatment plan with the patient. The patient was provided an opportunity to ask questions and all were answered. The patient agreed with the plan and demonstrated an understanding of the instructions.   The patient was advised to call back or seek an in-person evaluation if the symptoms worsen or if the condition fails to improve as anticipated.  I provided 20 minutes of non-face-to-face time during this encounter.   Carlos Nap, MD

## 2024-04-18 NOTE — Assessment & Plan Note (Signed)
 Now down to 4 to 5 cigarettes/day, utilizing nicotine  patches and replacement behaviors.

## 2024-04-18 NOTE — Assessment & Plan Note (Signed)
 Chronic, right > left.  Pending x-ray imaging of right hip, ordered left hip imaging as well.  Planning to see pain management on 10/29, requested refill of ibuprofen  until that time.  Provided short supply, advised on potential side effects and recommended against long-term/regular use. -Left hip x-ray -Refill ibuprofen  600 mg every 8 hours for limited use

## 2024-05-03 ENCOUNTER — Ambulatory Visit
Admission: RE | Admit: 2024-05-03 | Discharge: 2024-05-03 | Disposition: A | Source: Ambulatory Visit | Attending: Family Medicine

## 2024-05-05 ENCOUNTER — Encounter: Payer: Self-pay | Admitting: Family Medicine

## 2024-05-12 ENCOUNTER — Ambulatory Visit: Admitting: Cardiology

## 2024-05-12 NOTE — Progress Notes (Deleted)
  Cardiology Office Note:  .   Date:  05/12/2024  ID:  Carlos Montoya, DOB 1960/09/03, MRN [id number] PCP: Theophilus Pagan, MD  [facility] Cardiologist:  Newman Lawrence, MD PCP: Theophilus Pagan, MD  No chief complaint on file.    Carlos Montoya is a 63 y.o. male with *** Discussed the use of AI scribe software for clinical note transcription with the patient, who gave verbal consent to proceed.  History of Present Illness       There were no vitals filed for this visit.    ROS      Studies Reviewed: .        ***  CT chest 03/2024: 1. Lung-RADS 1, negative. Continue annual screening with low-dose chest CT without contrast in 12 months. 2. Two-vessel coronary atherosclerosis. 3. Aortic Atherosclerosis (ICD10-I70.0) and Emphysema (ICD10-J43.9).   Labs 02/2024: Chol 202, TG 156, HDL 59, LDL 116 HbA1C 5.6% Cr 0.83, AST 43 ***  Risk Assessment/Calculations:   {Does this patient have ATRIAL FIBRILLATION?:[id number]}    Physical Exam   VISIT DIAGNOSES: No diagnosis found.   Carlos Montoya is a 63 y.o. male with *** Assessment and Plan Assessment & Plan       {Are you ordering a CV Procedure (e.g. stress test, cath, DCCV, TEE, etc)?   Press F2        :[id number]}    No orders of the defined types were placed in this encounter.    F/u in ***  Signed, Newman JINNY Lawrence, MD

## 2024-05-24 NOTE — Progress Notes (Unsigned)
 Cardiology Office Note:  .   Date:  05/25/2024  ID:  Carlos Montoya, DOB 1960/12/19, MRN [id number] PCP: Theophilus Pagan, MD  [facility] Providers Cardiologist:  Newman JINNY Lawrence, MD {  History of Present Illness: .    Chief Complaint  Patient presents with   Chest Pain    Carlos Montoya is a 63 y.o. male with history of COPD, coronary calcifications who presents for the evaluation of chest pain at the request of Theophilus Pagan, MD.  History of Present Illness   Carlos Montoya is a 64 year old male with COPD and coronary calcifications who presents with chest pain. He was referred by Dr. Pagan Theophilus for evaluation of chest pain.  He has been experiencing sharp chest pain near the heart for the past six months, with episodes lasting between five to ten minutes. The pain occurs randomly and can be severe enough to consider calling emergency services. Movement sometimes alleviates the pain, but no specific triggers have been identified.  He has a history of COPD and coronary calcifications. A previous CT scan indicated the presence of plaque and atherosclerosis. He is currently taking a low-dose aspirin  daily and Crestor  10 mg daily for cholesterol management. He denies any history of heart attack or stroke and is not diabetic. He has been diagnosed with neuropathy and degenerative disc disease. Additionally, he has cataracts and dry eye disease, which affect his ability to tolerate fluorescent lights.  His family history includes grandparents with heart disease. His biological father died of cirrhosis of the liver, but his mother is still alive.  Socially, he has a long history of smoking since the age of 67, although he is attempting to quit and sometimes goes days without smoking. He smokes up to five cigarettes a day when he does smoke. He is retired and previously worked as a production assistant, radio. He is single and has no children.  During the  review of symptoms, the patient reports no history of abnormal EKGs. He does not take medication for high blood pressure and reports his blood pressure is generally good.           Problem List Coronary calcifications  HLD -T chol 202, TG 156, HDL 59, LDL 116 COPD HTN Etoh GERD OSA RBBB    ROS: All other ROS reviewed and negative. Pertinent positives noted in the HPI.     Studies Reviewed: SABRA   EKG Interpretation Date/Time:  Wednesday May 25 2024 13:42:20 EST Ventricular Rate:  73 PR Interval:  126 QRS Duration:  142 QT Interval:  420 QTC Calculation: 462 R Axis:   94  Text Interpretation: Normal sinus rhythm Right bundle branch block Confirmed by Barbaraann Kotyk [phone]) on 05/25/2024 1:43:29 PM   Physical Exam:   VS:  BP 114/70   Pulse 73   Ht 5' 11 (1.803 m)   Wt 145 lb (65.8 kg)   SpO2 96%   BMI 20.22 kg/m    Wt Readings from Last 3 Encounters:  05/25/24 145 lb (65.8 kg)  03/01/24 140 lb (63.5 kg)  01/11/24 137 lb 9.6 oz (62.4 kg)    GEN: Well nourished, well developed in no acute distress NECK: No JVD; No carotid bruits CARDIAC: RRR, no murmurs, rubs, gallops RESPIRATORY:  Clear to auscultation without rales, wheezing or rhonchi  ABDOMEN: Soft, non-tender, non-distended EXTREMITIES:  No edema; No deformity  ASSESSMENT AND PLAN: .   Assessment and Plan    Chest pain under evaluation  for possible coronary artery disease Intermittent sharp chest pain atypical for angina, possibly anxiety-related. Previous CT showed coronary calcium . Differential includes coronary artery disease. - Ordered coronary CTA with contrast. - Prescribed nitroglycerin 30 tablets as needed. - Continue aspirin  81 mg daily. - Continue Crestor  10 mg daily. - Ordered BMP prior to CT scan. - Administer metoprolol  100 mg on CT scan day. - F/U after CCTA and echo  Atherosclerotic heart disease of native coronary artery Atherosclerosis with aortic plaque. Family history and smoking  are significant risk factors. - Continue aspirin  81 mg daily. - Continue Crestor  10 mg daily. - Advised smoking cessation.  Mixed hyperlipidemia Managed with Crestor . - Continue Crestor  10 mg daily.  Tobacco abuse  Long-term smoker, 5 cigarettes daily. Smoking cessation critical for cardiovascular health. - Advised smoking cessation. - 3 min smoking cessation counseling provided in office today  Right bundle branch block EKG shows right bundle branch block, indicating faulty heart signal. - Ordered echocardiogram to assess heart function and valve status.              Follow-up: Return if symptoms worsen or fail to improve.  Signed, Darryle DASEN. Barbaraann, MD, [facility]  [facility]  [address] [place], [place] [address] [phone]  2:13 PM

## 2024-05-25 ENCOUNTER — Ambulatory Visit: Attending: Cardiovascular Disease | Admitting: Cardiovascular Disease

## 2024-05-25 ENCOUNTER — Encounter: Payer: Self-pay | Admitting: Cardiovascular Disease

## 2024-05-25 VITALS — BP 114/70 | HR 73 | Ht 71.0 in | Wt 145.0 lb

## 2024-05-25 DIAGNOSIS — E782 Mixed hyperlipidemia: Secondary | ICD-10-CM | POA: Diagnosis not present

## 2024-05-25 DIAGNOSIS — F17200 Nicotine dependence, unspecified, uncomplicated: Secondary | ICD-10-CM | POA: Diagnosis not present

## 2024-05-25 DIAGNOSIS — I2510 Atherosclerotic heart disease of native coronary artery without angina pectoris: Secondary | ICD-10-CM | POA: Insufficient documentation

## 2024-05-25 DIAGNOSIS — R072 Precordial pain: Secondary | ICD-10-CM | POA: Diagnosis not present

## 2024-05-25 MED ORDER — NITROGLYCERIN 0.4 MG SL SUBL: 0.4000 mg | SUBLINGUAL_TABLET | SUBLINGUAL | 0 refills | Status: AC | PRN

## 2024-05-25 MED ORDER — METOPROLOL TARTRATE 100 MG PO TABS: ORAL_TABLET | ORAL | 0 refills | Status: AC

## 2024-05-25 NOTE — Patient Instructions (Signed)
 Medication Instructions:  *Single Dose Metoprolol  Tartrate 100 mg take 1 tablet 2 hours prior to cardiac ct Start Nitroglycerin .4mg /sl place one tablet underneath your tongue as needed for chest pain.  *If you need a refill on your cardiac medications before your next appointment, please call your pharmacy*  Lab Work: BMET today   Testing/Procedures: Your physician has requested that you have an echocardiogram. Echocardiography is a painless test that uses sound waves to create images of your heart. It provides your doctor with information about the size and shape of your heart and how well your heart's chambers and valves are working. This procedure takes approximately one hour. There are no restrictions for this procedure. Please do NOT wear cologne, perfume, aftershave, or lotions (deodorant is allowed). Please arrive 15 minutes prior to your appointment time.  Please note: We ask at that you not bring children with you during ultrasound (echo/ vascular) testing. Due to room size and safety concerns, children are not allowed in the ultrasound rooms during exams. Our front office staff cannot provide observation of children in our lobby area while testing is being conducted. An adult accompanying a patient to their appointment will only be allowed in the ultrasound room at the discretion of the ultrasound technician under special circumstances. We apologize for any inconvenience.    Your cardiac CT will be scheduled at one of the below locations:   [facility] [address]  [place], [place] [address]  If scheduled at the Heart and Vascular Tower at [address], please enter the parking lot using the [address] entrance and use the FREE valet service at the patient drop-off area. Enter the building and check-in with registration on the main floor.  Please follow these instructions carefully (unless otherwise directed):  An IV will be required for  this test and Nitroglycerin will be given.  Hold all erectile dysfunction medications at least 3 days (72 hrs) prior to test. (Ie viagra, cialis, sildenafil, tadalafil, etc)   On the Night Before the Test: Be sure to Drink plenty of water. Do not consume any caffeinated/decaffeinated beverages or chocolate 12 hours prior to your test. Do not take any antihistamines 12 hours prior to your test. On the Day of the Test: Drink plenty of water until 1 hour prior to the test. Do not eat any food 1 hour prior to test. You may take your regular medications prior to the test.  Take metoprolol  (Lopressor ) two hours prior to test. If you take Furosemide/Hydrochlorothiazide/Spironolactone/Chlorthalidone, please HOLD on the morning of the test. Patients who wear a continuous glucose monitor MUST remove the device prior to scanning. FEMALES- please wear underwire-free bra if available, avoid dresses & tight clothing      After the Test: Drink plenty of water. After receiving IV contrast, you may experience a mild flushed feeling. This is normal. On occasion, you may experience a mild rash up to 24 hours after the test. This is not dangerous. If this occurs, you can take Benadryl  25 mg, Zyrtec , Claritin , or Allegra and increase your fluid intake. (Patients taking Tikosyn should avoid Benadryl , and may take Zyrtec , Claritin , or Allegra) If you experience trouble breathing, this can be serious. If it is severe call 911 IMMEDIATELY. If it is mild, please call our office.  We will call to schedule your test 2-4 weeks out understanding that some insurance companies will need an authorization prior to the service being performed.   For more information and  frequently asked questions, please visit our website : [email or web address]  For non-scheduling related questions, please contact the cardiac imaging nurse navigator should you have any questions/concerns: Cardiac Imaging Nurse  Navigators Direct Office Dial: [phone]   For scheduling needs, including cancellations and rescheduling, please call Brittany, [phone].    Follow-Up: At [facility], you and your health needs are our priority.  As part of our continuing mission to provide you with exceptional heart care, our providers are all part of one team.  This team includes your primary Cardiologist (physician) and Advanced Practice Providers or APPs (Physician Assistants and Nurse Practitioners) who all work together to provide you with the care you need, when you need it.  Your next appointment:    As needed  Provider:   Dr. Barbaraann     We recommend signing up for the patient portal called MyChart.  Sign up information is provided on this After Visit Summary.  MyChart is used to connect with patients for Virtual Visits (Telemedicine).  Patients are able to view lab/test results, encounter notes, upcoming appointments, etc.  Non-urgent messages can be sent to your provider as well.   To learn more about what you can do with MyChart, go to [email or web address].   Other Instructions

## 2024-06-06 ENCOUNTER — Telehealth: Payer: Self-pay

## 2024-06-06 NOTE — Telephone Encounter (Signed)
 Patient calls nurse line reporting bilateral eye burning.   He reports symptoms started right before thanksgiving. He reports a burning sensation in both eyes. He reports he has been using his eye drops, however no relief.   He denies any drainage or vision changes. He reports he has been using cold compresses for relief. He denies any trauma to the eyes. No fevers or chills. No redness or swelling.   Apt offered today, however he has medical transportation.   Apt made for 12/4 with PCP to discuss.   Strict precautions discussed with patient.

## 2024-06-09 ENCOUNTER — Ambulatory Visit: Admitting: Family Medicine

## 2024-06-09 NOTE — Progress Notes (Deleted)
   SUBJECTIVE:   CHIEF COMPLAINT / HPI:   ***  PERTINENT  PMH / PSH: ***  OBJECTIVE:   There were no vitals taken for this visit. ***  General: NAD, pleasant, able to participate in exam Cardiac: RRR, no murmurs. Respiratory: CTAB, normal effort, No wheezes, rales or rhonchi Abdomen: Bowel sounds present, nontender, nondistended Extremities: no edema or cyanosis. Skin: warm and dry, no rashes noted Neuro: alert, no obvious focal deficits Psych: Normal affect and mood  ASSESSMENT/PLAN:   No problem-specific Assessment & Plan notes found for this encounter.     Dr. Izetta Nap, DO [facility]    {    This will disappear when note is signed, click to select method of visit    :1}

## 2024-06-13 NOTE — Progress Notes (Deleted)
 BH MD/PA/NP OP Progress Note  06/13/2024 9:44 AM Carlos Montoya  MRN:  [id number]  Visit Diagnosis: No diagnosis found.  Assessment: Carlos Montoya is a 63 y.o. male with a history of bipolar disorder, PTSD, GAD, depression, and alcohol use disorder who presented to [facility] for initial evaluation on 04/05/2024.    At initial evaluation patient reported significant anxiety symptoms including racing thoughts, negative self thoughts, irritability, difficulty relaxing, and fear of something awful happening. GAD-7 was a 14.  Furthermore he endorsed neurovegetative symptoms of depression including low mood, anhedonia, amotivation, fatigue, disturbed sleep, and difficulty concentrating.  He intermittently experiences passive SI though denies any intent or plan. PHQ-9 was a 19.  Patient has experienced past trauma and endorsed nightmares, flashbacks, intrusive thoughts, and disturbed sleep.  Patient does endorse some paranoid beliefs though has no associated disorganization, delusions, or signs concerning for psychosis.  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  He does carry past diagnosis of bipolar 2 disorder however upon review did not endorse any symptoms of mania/hypomania.  Will continue to evaluate for this in the future.  Carlos Montoya presents for follow-up evaluation. Today, 06/13/24, patient reports ***  Risk Assessment: An assessment of suicide and violence risk factors was performed as part of this evaluation and is not significantly changed from the last visit. While future psychiatric events cannot be accurately predicted, the patient does not currently require acute inpatient psychiatric care and does not currently meet [facility]  involuntary commitment criteria. Patient was given contact information for crisis resources, behavioral health clinic and was instructed to call 911 for  emergencies.   Plan: # MDD and GAD Past medication trials: Prozac 20 mg for years and Zoloft (patient unsure about dosage), Remeron  30 mg for 4 weeks, Seroquel  50 mg nightly for several weeks (patient discontinued this medication on his own due to lack of efficacy). 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  HPI: ***   Past Psychiatric History:  Past psychiatric diagnoses: Alcohol use disorder, MDD, PTSD, GAD, and bipolar 2 disorder Psychiatric hospitalizations: Hospitalized twice about once 30 years ago and once 2017 in [place] Past suicide attempts: 30 years ago cut his wrists Hx of self harm: Denies Hx of violence towards others: Denies Prior psychiatric providers: Has followed with Dr. Aloha and Dr. Junette at [place] in the past Prior therapy: Tried group therapy felt it was worse, one on one therapy did not seem very helpful Access to firearms: Denies  Prior medication trials: Prozac 20 mg (recalls moderate benefit) for years and Zoloft, Remeron  30 mg for 4 weeks, Seroquel  50 mg nightly for several weeks (patient discontinued this medication on his own due to lack of efficacy), Lamictal  (ineffective), Naltrexone   Substance use: Has not had used marijuana or drank beer since August 2025.  Smokes 2-3 cigarettes a day. Denies any other substance    Past Medical History:  Past Medical History:  Diagnosis Date   Acid reflux    Alcohol use disorder, mild, abuse  Anxiety    Bipolar disorder (HCC)    COPD (chronic obstructive pulmonary disease) (HCC)    Depression     History of hepatitis C 2016   treated   Opioid dependence (HCC)    Pneumonia    Umbilical hernia     Past Surgical History:  Procedure Laterality Date   COLONOSCOPY     HERNIA REPAIR     TONSILLECTOMY     UMBILICAL HERNIA REPAIR N/A 03/14/2022   Procedure: LAPAROSCOPIC ASSISTED REPAIR UMBILICAL HERNIA WITH MESH;  Surgeon: Tanda Locus, MD;  Location: [facility];  Service: General;  Laterality: N/A;   Family History:  Family History  Problem Relation Age of Onset   Diabetes Mother    Heart disease Father    Colon cancer Father     Social History:  Social History   Socioeconomic History   Marital status: Single    Spouse name: Not on file   Number of children: 0   Years of education: Not on file   Highest education level: Not on file  Occupational History   Occupation: Retired  Tobacco Use   Smoking status: Every Day    Current packs/day: 0.50    Average packs/day: 0.5 packs/day for 40.1 years (20.0 ttl pk-yrs)    Types: Cigarettes    Start date: 05/25/1984   Smokeless tobacco: Never   Tobacco comments:    Trying to quit, has a patch  Vaping Use   Vaping status: Never Used  Substance and Sexual Activity   Alcohol use: Not Currently   Drug use: No   Sexual activity: Yes  Other Topics Concern   Not on file  Social History Narrative   Not on file   Social Drivers of Health   Financial Resource Strain: Not on file  Food Insecurity: No Food Insecurity (03/07/2023)   Hunger Vital Sign    Worried About Running Out of Food in the Last Year: Never true    Ran Out of Food in the Last Year: Never true  Transportation Needs: No Transportation Needs (03/07/2023)   PRAPARE - Administrator, Civil Service (Medical): No    Lack of Transportation (Non-Medical): No  Physical Activity: Not on file  Stress: Not on file  Social Connections: Not on file    Allergies:  Allergies  Allergen Reactions   Prednisone Hives   Diphenhydramine  Hcl Other (See Comments)     Has opposite effect   Tylenol  [Acetaminophen ] Other (See Comments)    Told to avoid due to having hepatitis C previously    Current Medications: Current Outpatient Medications  Medication Sig Dispense Refill   ANORO ELLIPTA  62.5-25 MCG/ACT AEPB TAKE 1 PUFF BY MOUTH EVERY DAY 60 each 2   aspirin  EC 81 MG tablet Take 81 mg by mouth daily. Swallow whole.     DULoxetine  (CYMBALTA ) 30 MG capsule Take 1 capsule (30 mg total) by mouth daily. 30 capsule 2   ibuprofen  (ADVIL ) 600 MG tablet Take 1 tablet (600 mg total) by mouth every 8 (eight) hours as needed. 30 tablet 0   metoprolol  tartrate (LOPRESSOR ) 100 MG tablet Take 1 tablet 2 hours prior to cardiac ct 1 tablet 0   nicotine  (NICODERM CQ  - DOSED IN MG/24 HOURS) 21 mg/24hr patch Place 1 patch (21 mg total) onto the skin daily. 28 patch 5   nitroGLYCERIN  (NITROSTAT ) 0.4 MG SL tablet Place 1 tablet (0.4 mg total) under the tongue every 5 (five) minutes as needed. 30 tablet  0   Olopatadine  HCl (PATADAY ) 0.2 % SOLN Apply 1 drop to eye daily. 2.5 mL 12   omeprazole  (PRILOSEC) 20 MG capsule TAKE 1 CAPSULE BY MOUTH EVERY DAY 90 capsule 1   prazosin  (MINIPRESS ) 1 MG capsule Take 1 capsule (1 mg total) by mouth at bedtime. 30 capsule 2   rosuvastatin  (CRESTOR ) 10 MG tablet Take 1 tablet (10 mg total) by mouth daily. 90 tablet 1   traZODone  (DESYREL ) 100 MG tablet Take 1 tablet (100 mg total) by mouth at bedtime. 30 tablet 2   traZODone  (DESYREL ) 50 MG tablet Take 1 tablet (50 mg total) by mouth at bedtime as needed for sleep. 30 tablet 3   No current facility-administered medications for this visit.     Musculoskeletal: Strength & Muscle Tone: {desc; muscle tone:32375} Gait & Station: {PE GAIT ED WJUO:77474} Patient leans: {Patient Leans:21022755}  Psychiatric Specialty Exam: There were no vitals taken for this visit.There is no height or weight on file to calculate BMI. Review of Systems  General Appearance: {Appearance:22683}  Eye Contact:   {BHH EYE CONTACT:22684}  Speech:  {Speech:22685}  Volume:  {Volume (PAA):22686}  Mood:  {BHH MOOD:22306}  Affect:  {Affect (PAA):22687}  Thought Content: {Thought Content:22690}   Suicidal Thoughts:  {ST/HT (PAA):22692}  Homicidal Thoughts:  {ST/HT (PAA):22692}  Thought Process:  {Thought Process (PAA):22688}  Orientation:  {BHH ORIENTATION (PAA):22689}    Memory: {BHH MEMORY:22881}  Judgment:  {Judgement (PAA):22694}  Insight:  {Insight (PAA):22695}  Concentration:  {Concentration:21399}  Recall:  not formally assessed ***  Fund of Knowledge: {BHH GOOD/FAIR/POOR:22877}  Language: {BHH GOOD/FAIR/POOR:22877}  Psychomotor Activity:  {Psychomotor (PAA):22696}  Akathisia:  {BHH YES OR NO:22294}  AIMS (if indicated): {Desc; done/not:10129}  Assets:  {Assets (PAA):22698}  ADL's:  {BHH JIO'D:77709}  Cognition: {chl bhh cognition:304700322}  Sleep:  {BHH GOOD/FAIR/POOR:22877}   Metabolic Disorder Labs: Lab Results  Component Value Date   HGBA1C 5.6 01/11/2024   No results found for: PROLACTIN Lab Results  Component Value Date   CHOL 202 (H) 03/01/2024   TRIG 156 (H) 03/01/2024   HDL 59 03/01/2024   CHOLHDL 3.4 03/01/2024   LDLCALC 116 (H) 03/01/2024   LDLCALC 67 11/06/2021   Lab Results  Component Value Date   TSH 1.410 09/13/2020    Therapeutic Level Labs: No results found for: LITHIUM No results found for: VALPROATE No results found for: CBMZ   Screenings: PHQ2-9    Flowsheet Row Office Visit from [facility] Office Visit from 03/01/2024 in [facility] Of [place]. [facility] Office Visit from 01/11/2024 in [facility] - [facility] Dept Of [place]. [facility] Office Visit from 03/20/2023 in [facility] - A Dept Of [facility]. [facility] Office Visit from 12/17/2022 in [facility]  PHQ-2 Total Score  6 6 6 6 6   PHQ-9 Total Score 19 25 21 19  --   Flowsheet Row ED from 04/14/2023 in [facility] Department at [facility] ED to Hosp-Admission (Discharged) from 03/06/2023 in [facility] Medical [facility] ED from 01/20/2023 in [facility] Emergency Department at [facility]  C-SSRS RISK CATEGORY No Risk No Risk No Risk    Collaboration of Care: Collaboration of Care: Medication Management AEB medication prescription, Primary Care Provider AEB chart review, and Other provider involved in patient's care AEB cardiology chart review  Patient/Guardian was  advised Release of Information must be obtained prior to any record release in order to collaborate their care with an outside provider. Patient/Guardian was advised if they have not already done so to contact the registration department to sign all necessary forms in order for us  to release information regarding their care.   Consent: Patient/Guardian gives verbal consent for treatment and assignment of benefits for services provided during this visit. Patient/Guardian expressed understanding and agreed to proceed.    Arvella CHRISTELLA Finder, MD 06/13/2024, 9:44 AM

## 2024-06-14 ENCOUNTER — Ambulatory Visit (HOSPITAL_COMMUNITY): Admitting: Psychiatry

## 2024-06-15 NOTE — Progress Notes (Unsigned)
° ° °  SUBJECTIVE:   CHIEF COMPLAINT / HPI:   Discussed the use of AI scribe software for clinical note transcription with the patient, who gave verbal consent to proceed.  Neuropathic pain - Chronic stabbing and burning pain localized to the right thigh - Pain severe enough to disrupt sleep - No relief with gabapentin  - Previously used oxycodone  but ran out; complications with medication management - Declines pain injections due to negative family experience - Uses daily beer and occasional marijuana for pain management  Insomnia Bipolar depression - Sleep limited to approximately three hours per night - Trazodone  100 mg discontinued due to morning dizziness and falls - Bipolar disorder with current severe distress - Taking duloxetine  but perceives no benefit - Expresses feelings of hopelessness, stating he feels like a dead man walking and waiting to die  Eye burning, bilateral - Cataracts and dry eye disease - Constant eye burning sensation - Visual discomfort limits ability to watch TV and read  PERTINENT  PMH / PSH: Asthma, GERD, chronic pain syndrome, bipolar depression, alcohol use disorder, tobacco use disorder  OBJECTIVE:   BP 138/85   Pulse 84   Ht 5' 11 (1.803 m)   Wt 146 lb 3.2 oz (66.3 kg)   SpO2 96%   BMI 20.39 kg/m    General: NAD, pleasant, able to participate in exam HEENT: Noted to have 3 eyelashes going inward on right eye with mild conjunctival injection.  Left eye noted to have 1 eyelash growing inward with mild conjunctival injection.  No discharge bilaterally. Cardiac: RRR, no murmurs. Respiratory: CTAB, normal effort, No wheezes, rales or rhonchi Abdomen: Bowel sounds present, nontender, nondistended Extremities: no edema or cyanosis. Skin: warm and dry, no rashes noted Neuro: alert, no obvious focal deficits Psych: Normal affect and mood  ASSESSMENT/PLAN:   Assessment & Plan Degeneration of intervertebral disc of lumbar region with  discogenic back pain and lower extremity pain Poorly controlled symptoms, appear to have some neuropathic origin but declined any interest in gabapentin  as he was previously on 600 mg 3 times daily without benefit.  Seen by interventional pain management, declines any interest in [facility] given prior poor experience.  Mostly interested in medication management, given severity of symptoms and patient desire to retrial opioid therapy will refer to pain clinic. -Trial tizanidine 4 mg q12h as needed -Increase duloxetine  to 40 mg daily -Referral to pain clinic PTSD (post-traumatic stress disorder) Moderate episode of recurrent major depressive disorder (HCC) GAD (generalized anxiety disorder) Poorly controlled, followed by behavioral health but has not scheduled follow-up due to perceived lack of benefit.  Recommend patient follow-up with psychiatry given underlying bipolar depression and would likely benefit from more mood stabilization.  Will increase duloxetine  as above Elevated LFTs Previously noted, prior HCV infection and current alcohol use disorder.  Will obtain imaging to evaluate further. -RUQ US  Burning sensation of eye Possibly secondary to eyelashes growing inward causing persistent eye irritation.  Some relief with Pataday  eyedrops.  Will trial erythromycin  but suspect he needs ophthalmology follow-up ultimately. -Erythromycin  eye ointment x 5 days   Dr. Izetta Nap, DO [facility]

## 2024-06-16 ENCOUNTER — Ambulatory Visit: Admitting: Family Medicine

## 2024-06-16 ENCOUNTER — Other Ambulatory Visit: Payer: Self-pay | Admitting: Family Medicine

## 2024-06-16 ENCOUNTER — Encounter: Payer: Self-pay | Admitting: Family Medicine

## 2024-06-16 VITALS — BP 138/85 | HR 84 | Ht 71.0 in | Wt 146.2 lb

## 2024-06-16 DIAGNOSIS — F411 Generalized anxiety disorder: Secondary | ICD-10-CM

## 2024-06-16 DIAGNOSIS — R7989 Other specified abnormal findings of blood chemistry: Secondary | ICD-10-CM

## 2024-06-16 DIAGNOSIS — F4310 Post-traumatic stress disorder, unspecified: Secondary | ICD-10-CM

## 2024-06-16 DIAGNOSIS — M51362 Other intervertebral disc degeneration, lumbar region with discogenic back pain and lower extremity pain: Secondary | ICD-10-CM

## 2024-06-16 DIAGNOSIS — F331 Major depressive disorder, recurrent, moderate: Secondary | ICD-10-CM

## 2024-06-16 DIAGNOSIS — H5789 Other specified disorders of eye and adnexa: Secondary | ICD-10-CM

## 2024-06-16 MED ORDER — DULOXETINE HCL 40 MG PO CPEP: 30.0000 mg | ORAL_CAPSULE | Freq: Every day | ORAL | 2 refills | Status: DC

## 2024-06-16 MED ORDER — ERYTHROMYCIN 5 MG/GM OP OINT: 1.0000 | TOPICAL_OINTMENT | Freq: Every day | OPHTHALMIC | 0 refills | Status: AC

## 2024-06-16 NOTE — Patient Instructions (Addendum)
 It was wonderful to see you today! Thank you for choosing [facility].   Please bring ALL of your medications with you to every visit.   Today we talked about:  For your eyes please continue to use the Pataday  eyedrops and I am going to do a short course of erythromycin eye ointment that you use 4 times per day for the next 5 days to see if it helps with your symptoms.  Ultimately I think you need to follow-up with the ophthalmologist for further evaluation and care. Please try to cut back on your alcohol use as this will only worsen any condition with your liver.  I do think given your elevated liver enzymes previously we can get a right upper quadrant ultrasound to see if there are any changes to your liver that are concerning.  Our office will assist with setting this up. I am sorry that you are in so much pain now that is very challenging.  Please use Tylenol  up to 2000 mg/day as this is safe even in patients with cirrhosis.  I am also giving you a muscle relaxer that you can use twice per day but particularly use it at night as it can help with muscle cramping.  I am also increasing her duloxetine  to 40 mg daily to see if this will help with your pain.  I am also referring you to the pain management center that does more medication management for pain as opposed to interventions such as injections. Please follow-up with your psychiatrist, I do think you need to continue management of your mental health as this will overall help with your pain and other symptoms.  Please follow up in 3 months   If you haven't already, sign up for My Chart to have easy access to your labs results, and communication with your primary care physician.  Call the clinic at [phone] if your symptoms worsen or you have any concerns.  Please be sure to schedule follow up at the front desk before you leave today.   Izetta Nap, DO Family Medicine

## 2024-06-17 ENCOUNTER — Ambulatory Visit (HOSPITAL_COMMUNITY): Admission: RE | Admit: 2024-06-17

## 2024-06-17 NOTE — Assessment & Plan Note (Signed)
 Poorly controlled, followed by behavioral health but has not scheduled follow-up due to perceived lack of benefit.  Recommend patient follow-up with psychiatry given underlying bipolar depression and would likely benefit from more mood stabilization.  Will increase duloxetine  as above

## 2024-06-17 NOTE — Assessment & Plan Note (Signed)
 Poorly controlled symptoms, appear to have some neuropathic origin but declined any interest in gabapentin  as he was previously on 600 mg 3 times daily without benefit.  Seen by interventional pain management, declines any interest in [facility] given prior poor experience.  Mostly interested in medication management, given severity of symptoms and patient desire to retrial opioid therapy will refer to pain clinic. -Trial tizanidine 4 mg q12h as needed -Increase duloxetine  to 40 mg daily -Referral to pain clinic

## 2024-06-27 ENCOUNTER — Ambulatory Visit (HOSPITAL_COMMUNITY)
Admission: RE | Admit: 2024-06-27 | Discharge: 2024-06-27 | Disposition: A | Discharge status: Home / Self Care | Source: Ambulatory Visit | Attending: Cardiovascular Disease | Admitting: Cardiovascular Disease

## 2024-06-27 DIAGNOSIS — R072 Precordial pain: Secondary | ICD-10-CM | POA: Diagnosis not present

## 2024-06-27 MED ORDER — NITROGLYCERIN 0.4 MG SL SUBL
0.8000 mg | SUBLINGUAL_TABLET | Freq: Once | SUBLINGUAL | Status: AC
  Administered 2024-06-27: 0.8 mg via SUBLINGUAL

## 2024-06-27 MED ORDER — IOHEXOL 350 MG/ML SOLN
100.0000 mL | Freq: Once | INTRAVENOUS | Status: AC | PRN
  Administered 2024-06-27: 100 mL via INTRAVENOUS

## 2024-06-29 ENCOUNTER — Ambulatory Visit: Payer: Self-pay | Admitting: Cardiovascular Disease

## 2024-06-29 DIAGNOSIS — E782 Mixed hyperlipidemia: Secondary | ICD-10-CM

## 2024-06-29 DIAGNOSIS — I2510 Atherosclerotic heart disease of native coronary artery without angina pectoris: Secondary | ICD-10-CM

## 2024-07-05 ENCOUNTER — Other Ambulatory Visit: Payer: Self-pay | Admitting: Family Medicine

## 2024-07-05 ENCOUNTER — Ambulatory Visit (HOSPITAL_COMMUNITY)

## 2024-07-05 DIAGNOSIS — J449 Chronic obstructive pulmonary disease, unspecified: Secondary | ICD-10-CM

## 2024-07-11 ENCOUNTER — Other Ambulatory Visit

## 2024-07-11 ENCOUNTER — Telehealth (HOSPITAL_COMMUNITY): Payer: Self-pay | Admitting: Cardiovascular Disease

## 2024-07-11 NOTE — Telephone Encounter (Signed)
 Patient has cancelled echocardiogram x 2 and will call back to reschedule. Order will be removed from the echo WQ and if patient calls back we will reinstate the order. Thank you.

## 2024-07-15 NOTE — Telephone Encounter (Signed)
Pt returning call for results. Please advise.  

## 2024-07-18 MED ORDER — ROSUVASTATIN CALCIUM 40 MG PO TABS: 40.0000 mg | ORAL_TABLET | Freq: Every day | ORAL | 3 refills | Status: AC
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
## 2024-08-02 ENCOUNTER — Ambulatory Visit (HOSPITAL_COMMUNITY): Admitting: Psychiatry

## 2024-08-23 ENCOUNTER — Ambulatory Visit (HOSPITAL_COMMUNITY): Admitting: Psychiatry

## 2024-08-23 ENCOUNTER — Ambulatory Visit (HOSPITAL_COMMUNITY)

## 2024-09-06 ENCOUNTER — Ambulatory Visit (HOSPITAL_COMMUNITY)

## 2024-09-23 ENCOUNTER — Ambulatory Visit: Admitting: Nurse Practitioner
